# Patient Record
Sex: Female | Born: 1940 | Race: White | Hispanic: No | Smoking: Never smoker
Health system: Southern US, Community
[De-identification: ages and names within clinical notes are randomized; demographics above are authoritative.]

## PROBLEM LIST (undated history)

## (undated) DIAGNOSIS — R0981 Nasal congestion: Secondary | ICD-10-CM

## (undated) DIAGNOSIS — E7800 Pure hypercholesterolemia, unspecified: Secondary | ICD-10-CM

## (undated) DIAGNOSIS — G2581 Restless legs syndrome: Secondary | ICD-10-CM

## (undated) DIAGNOSIS — M1990 Unspecified osteoarthritis, unspecified site: Secondary | ICD-10-CM

## (undated) DIAGNOSIS — M47812 Spondylosis without myelopathy or radiculopathy, cervical region: Secondary | ICD-10-CM

## (undated) DIAGNOSIS — I10 Essential (primary) hypertension: Secondary | ICD-10-CM

## (undated) DIAGNOSIS — K8020 Calculus of gallbladder without cholecystitis without obstruction: Secondary | ICD-10-CM

## (undated) DIAGNOSIS — H9190 Unspecified hearing loss, unspecified ear: Secondary | ICD-10-CM

## (undated) DIAGNOSIS — J45909 Unspecified asthma, uncomplicated: Secondary | ICD-10-CM

## (undated) DIAGNOSIS — E785 Hyperlipidemia, unspecified: Secondary | ICD-10-CM

## (undated) DIAGNOSIS — R140 Abdominal distension (gaseous): Secondary | ICD-10-CM

## (undated) DIAGNOSIS — N183 Chronic kidney disease, stage 3 unspecified: Secondary | ICD-10-CM

## (undated) DIAGNOSIS — C801 Malignant (primary) neoplasm, unspecified: Secondary | ICD-10-CM

## (undated) DIAGNOSIS — L309 Dermatitis, unspecified: Secondary | ICD-10-CM

## (undated) DIAGNOSIS — K635 Polyp of colon: Secondary | ICD-10-CM

## (undated) DIAGNOSIS — M722 Plantar fascial fibromatosis: Secondary | ICD-10-CM

## (undated) DIAGNOSIS — G4733 Obstructive sleep apnea (adult) (pediatric): Secondary | ICD-10-CM

## (undated) DIAGNOSIS — M8580 Other specified disorders of bone density and structure, unspecified site: Secondary | ICD-10-CM

## (undated) DIAGNOSIS — K219 Gastro-esophageal reflux disease without esophagitis: Secondary | ICD-10-CM

## (undated) DIAGNOSIS — R062 Wheezing: Secondary | ICD-10-CM

## (undated) DIAGNOSIS — N6091 Unspecified benign mammary dysplasia of right breast: Secondary | ICD-10-CM

## (undated) HISTORY — DX: Unspecified osteoarthritis, unspecified site: M19.90

## (undated) HISTORY — DX: Unspecified hearing loss, unspecified ear: H91.90

## (undated) HISTORY — DX: Chronic kidney disease, stage 3 (moderate): N18.3

## (undated) HISTORY — DX: Other specified disorders of bone density and structure, unspecified site: M85.80

## (undated) HISTORY — DX: Abdominal distension (gaseous): R14.0

## (undated) HISTORY — DX: Polyp of colon: K63.5

## (undated) HISTORY — DX: Unspecified asthma, uncomplicated: J45.909

## (undated) HISTORY — DX: Nasal congestion: R09.81

## (undated) HISTORY — DX: Hyperlipidemia, unspecified: E78.5

## (undated) HISTORY — DX: Spondylosis without myelopathy or radiculopathy, cervical region: M47.812

## (undated) HISTORY — DX: Restless legs syndrome: G25.81

## (undated) HISTORY — DX: Malignant (primary) neoplasm, unspecified: C80.1

## (undated) HISTORY — DX: Plantar fascial fibromatosis: M72.2

## (undated) HISTORY — DX: Calculus of gallbladder without cholecystitis without obstruction: K80.20

## (undated) HISTORY — PX: FOOT NEUROMA SURGERY: SHX646

## (undated) HISTORY — DX: Pure hypercholesterolemia, unspecified: E78.00

## (undated) HISTORY — DX: Gastro-esophageal reflux disease without esophagitis: K21.9

## (undated) HISTORY — DX: Essential (primary) hypertension: I10

## (undated) HISTORY — DX: Chronic kidney disease, stage 3 unspecified: N18.30

## (undated) HISTORY — PX: LIPOMA EXCISION: SHX5283

## (undated) HISTORY — DX: Dermatitis, unspecified: L30.9

## (undated) HISTORY — DX: Wheezing: R06.2

## (undated) HISTORY — DX: Obstructive sleep apnea (adult) (pediatric): G47.33

---

## 1969-05-23 HISTORY — PX: TUBAL LIGATION: SHX77

## 1975-05-24 HISTORY — PX: BREAST SURGERY: SHX581

## 1994-05-23 HISTORY — PX: BREAST SURGERY: SHX581

## 1998-10-06 ENCOUNTER — Other Ambulatory Visit: Admission: RE | Admit: 1998-10-06 | Discharge: 1998-10-06 | Payer: Self-pay | Admitting: Obstetrics and Gynecology

## 1998-11-02 ENCOUNTER — Other Ambulatory Visit: Admission: RE | Admit: 1998-11-02 | Discharge: 1998-11-02 | Payer: Self-pay | Admitting: Obstetrics and Gynecology

## 1998-12-12 ENCOUNTER — Ambulatory Visit (HOSPITAL_COMMUNITY): Admission: RE | Admit: 1998-12-12 | Discharge: 1998-12-12 | Payer: Self-pay | Admitting: *Deleted

## 1999-04-29 ENCOUNTER — Other Ambulatory Visit: Admission: RE | Admit: 1999-04-29 | Discharge: 1999-04-29 | Payer: Self-pay | Admitting: Obstetrics and Gynecology

## 2000-04-26 ENCOUNTER — Other Ambulatory Visit: Admission: RE | Admit: 2000-04-26 | Discharge: 2000-04-26 | Payer: Self-pay | Admitting: Obstetrics and Gynecology

## 2001-09-11 ENCOUNTER — Other Ambulatory Visit: Admission: RE | Admit: 2001-09-11 | Discharge: 2001-09-11 | Payer: Self-pay | Admitting: Family Medicine

## 2002-10-29 ENCOUNTER — Ambulatory Visit (HOSPITAL_COMMUNITY): Admission: RE | Admit: 2002-10-29 | Discharge: 2002-10-29 | Payer: Self-pay | Admitting: General Surgery

## 2003-03-12 ENCOUNTER — Other Ambulatory Visit: Admission: RE | Admit: 2003-03-12 | Discharge: 2003-03-12 | Payer: Self-pay | Admitting: Family Medicine

## 2003-03-14 ENCOUNTER — Encounter: Payer: Self-pay | Admitting: Family Medicine

## 2003-03-14 ENCOUNTER — Ambulatory Visit (HOSPITAL_COMMUNITY): Admission: RE | Admit: 2003-03-14 | Discharge: 2003-03-14 | Payer: Self-pay | Admitting: Family Medicine

## 2004-03-12 ENCOUNTER — Other Ambulatory Visit: Admission: RE | Admit: 2004-03-12 | Discharge: 2004-03-12 | Payer: Self-pay | Admitting: Family Medicine

## 2004-05-13 ENCOUNTER — Ambulatory Visit (HOSPITAL_COMMUNITY): Admission: RE | Admit: 2004-05-13 | Discharge: 2004-05-13 | Payer: Self-pay | Admitting: Gastroenterology

## 2006-06-01 ENCOUNTER — Other Ambulatory Visit: Admission: RE | Admit: 2006-06-01 | Discharge: 2006-06-01 | Payer: Self-pay | Admitting: Family Medicine

## 2011-04-23 DIAGNOSIS — C801 Malignant (primary) neoplasm, unspecified: Secondary | ICD-10-CM

## 2011-04-23 HISTORY — DX: Malignant (primary) neoplasm, unspecified: C80.1

## 2011-05-06 ENCOUNTER — Other Ambulatory Visit: Payer: Self-pay | Admitting: Cardiology

## 2011-05-06 ENCOUNTER — Ambulatory Visit
Admission: RE | Admit: 2011-05-06 | Discharge: 2011-05-06 | Disposition: A | Discharge status: Home / Self Care | Payer: Medicare Other | Source: Ambulatory Visit | Attending: Cardiology | Admitting: Cardiology

## 2011-05-06 DIAGNOSIS — R0602 Shortness of breath: Secondary | ICD-10-CM

## 2011-05-06 MED ORDER — IOHEXOL 300 MG/ML  SOLN: 100.0000 mL | Freq: Once | INTRAMUSCULAR | Status: AC | PRN

## 2011-05-31 DIAGNOSIS — G2589 Other specified extrapyramidal and movement disorders: Secondary | ICD-10-CM | POA: Diagnosis not present

## 2011-05-31 DIAGNOSIS — J309 Allergic rhinitis, unspecified: Secondary | ICD-10-CM | POA: Diagnosis not present

## 2011-05-31 DIAGNOSIS — I10 Essential (primary) hypertension: Secondary | ICD-10-CM | POA: Diagnosis not present

## 2011-05-31 DIAGNOSIS — E785 Hyperlipidemia, unspecified: Secondary | ICD-10-CM | POA: Diagnosis not present

## 2011-05-31 DIAGNOSIS — K219 Gastro-esophageal reflux disease without esophagitis: Secondary | ICD-10-CM | POA: Diagnosis not present

## 2011-05-31 DIAGNOSIS — N289 Disorder of kidney and ureter, unspecified: Secondary | ICD-10-CM | POA: Diagnosis not present

## 2011-05-31 DIAGNOSIS — J45909 Unspecified asthma, uncomplicated: Secondary | ICD-10-CM | POA: Diagnosis not present

## 2011-06-03 DIAGNOSIS — H1045 Other chronic allergic conjunctivitis: Secondary | ICD-10-CM | POA: Diagnosis not present

## 2011-06-03 DIAGNOSIS — J45909 Unspecified asthma, uncomplicated: Secondary | ICD-10-CM | POA: Diagnosis not present

## 2011-06-03 DIAGNOSIS — J3089 Other allergic rhinitis: Secondary | ICD-10-CM | POA: Diagnosis not present

## 2011-06-07 DIAGNOSIS — Z01810 Encounter for preprocedural cardiovascular examination: Secondary | ICD-10-CM | POA: Diagnosis not present

## 2011-06-07 DIAGNOSIS — N289 Disorder of kidney and ureter, unspecified: Secondary | ICD-10-CM | POA: Diagnosis not present

## 2011-06-07 DIAGNOSIS — I498 Other specified cardiac arrhythmias: Secondary | ICD-10-CM | POA: Diagnosis not present

## 2011-06-07 DIAGNOSIS — J45909 Unspecified asthma, uncomplicated: Secondary | ICD-10-CM | POA: Diagnosis not present

## 2011-06-07 DIAGNOSIS — C649 Malignant neoplasm of unspecified kidney, except renal pelvis: Secondary | ICD-10-CM | POA: Diagnosis not present

## 2011-06-07 DIAGNOSIS — E7800 Pure hypercholesterolemia, unspecified: Secondary | ICD-10-CM | POA: Diagnosis not present

## 2011-06-07 DIAGNOSIS — I10 Essential (primary) hypertension: Secondary | ICD-10-CM | POA: Diagnosis not present

## 2011-06-07 DIAGNOSIS — Z01818 Encounter for other preprocedural examination: Secondary | ICD-10-CM | POA: Diagnosis not present

## 2011-06-09 DIAGNOSIS — Z452 Encounter for adjustment and management of vascular access device: Secondary | ICD-10-CM | POA: Diagnosis not present

## 2011-06-09 DIAGNOSIS — J9819 Other pulmonary collapse: Secondary | ICD-10-CM | POA: Diagnosis not present

## 2011-06-09 DIAGNOSIS — I82220 Acute embolism and thrombosis of inferior vena cava: Secondary | ICD-10-CM | POA: Diagnosis not present

## 2011-06-09 DIAGNOSIS — D4959 Neoplasm of unspecified behavior of other genitourinary organ: Secondary | ICD-10-CM | POA: Diagnosis not present

## 2011-06-09 DIAGNOSIS — N289 Disorder of kidney and ureter, unspecified: Secondary | ICD-10-CM | POA: Diagnosis not present

## 2011-06-09 DIAGNOSIS — J90 Pleural effusion, not elsewhere classified: Secondary | ICD-10-CM | POA: Diagnosis not present

## 2011-06-09 DIAGNOSIS — J9383 Other pneumothorax: Secondary | ICD-10-CM | POA: Diagnosis not present

## 2011-06-09 DIAGNOSIS — R918 Other nonspecific abnormal finding of lung field: Secondary | ICD-10-CM | POA: Diagnosis not present

## 2011-06-09 DIAGNOSIS — C649 Malignant neoplasm of unspecified kidney, except renal pelvis: Secondary | ICD-10-CM | POA: Diagnosis not present

## 2011-06-09 HISTORY — PX: NEPHRECTOMY: SHX65

## 2011-06-21 DIAGNOSIS — Z09 Encounter for follow-up examination after completed treatment for conditions other than malignant neoplasm: Secondary | ICD-10-CM | POA: Diagnosis not present

## 2011-06-21 DIAGNOSIS — C649 Malignant neoplasm of unspecified kidney, except renal pelvis: Secondary | ICD-10-CM | POA: Diagnosis not present

## 2011-08-02 DIAGNOSIS — L570 Actinic keratosis: Secondary | ICD-10-CM | POA: Diagnosis not present

## 2011-08-02 DIAGNOSIS — L821 Other seborrheic keratosis: Secondary | ICD-10-CM | POA: Diagnosis not present

## 2011-09-16 DIAGNOSIS — R209 Unspecified disturbances of skin sensation: Secondary | ICD-10-CM | POA: Diagnosis not present

## 2011-09-16 DIAGNOSIS — Z09 Encounter for follow-up examination after completed treatment for conditions other than malignant neoplasm: Secondary | ICD-10-CM | POA: Diagnosis not present

## 2011-09-16 DIAGNOSIS — J8410 Pulmonary fibrosis, unspecified: Secondary | ICD-10-CM | POA: Diagnosis not present

## 2011-09-16 DIAGNOSIS — K8020 Calculus of gallbladder without cholecystitis without obstruction: Secondary | ICD-10-CM | POA: Diagnosis not present

## 2011-09-16 DIAGNOSIS — Z85528 Personal history of other malignant neoplasm of kidney: Secondary | ICD-10-CM | POA: Diagnosis not present

## 2011-09-16 DIAGNOSIS — C649 Malignant neoplasm of unspecified kidney, except renal pelvis: Secondary | ICD-10-CM | POA: Diagnosis not present

## 2011-11-03 DIAGNOSIS — I10 Essential (primary) hypertension: Secondary | ICD-10-CM | POA: Diagnosis not present

## 2011-11-03 DIAGNOSIS — G4733 Obstructive sleep apnea (adult) (pediatric): Secondary | ICD-10-CM | POA: Diagnosis not present

## 2011-11-21 DIAGNOSIS — Z8262 Family history of osteoporosis: Secondary | ICD-10-CM | POA: Diagnosis not present

## 2011-11-21 DIAGNOSIS — Z803 Family history of malignant neoplasm of breast: Secondary | ICD-10-CM | POA: Diagnosis not present

## 2011-11-21 DIAGNOSIS — M899 Disorder of bone, unspecified: Secondary | ICD-10-CM | POA: Diagnosis not present

## 2011-11-21 DIAGNOSIS — Z1231 Encounter for screening mammogram for malignant neoplasm of breast: Secondary | ICD-10-CM | POA: Diagnosis not present

## 2011-11-21 DIAGNOSIS — M949 Disorder of cartilage, unspecified: Secondary | ICD-10-CM | POA: Diagnosis not present

## 2011-11-28 DIAGNOSIS — F4321 Adjustment disorder with depressed mood: Secondary | ICD-10-CM | POA: Diagnosis not present

## 2011-11-28 DIAGNOSIS — I10 Essential (primary) hypertension: Secondary | ICD-10-CM | POA: Diagnosis not present

## 2011-11-28 DIAGNOSIS — M899 Disorder of bone, unspecified: Secondary | ICD-10-CM | POA: Diagnosis not present

## 2011-11-28 DIAGNOSIS — K219 Gastro-esophageal reflux disease without esophagitis: Secondary | ICD-10-CM | POA: Diagnosis not present

## 2011-11-28 DIAGNOSIS — E785 Hyperlipidemia, unspecified: Secondary | ICD-10-CM | POA: Diagnosis not present

## 2011-12-05 DIAGNOSIS — E875 Hyperkalemia: Secondary | ICD-10-CM | POA: Diagnosis not present

## 2011-12-09 DIAGNOSIS — J45909 Unspecified asthma, uncomplicated: Secondary | ICD-10-CM | POA: Diagnosis not present

## 2011-12-09 DIAGNOSIS — J3089 Other allergic rhinitis: Secondary | ICD-10-CM | POA: Diagnosis not present

## 2011-12-09 DIAGNOSIS — H1045 Other chronic allergic conjunctivitis: Secondary | ICD-10-CM | POA: Diagnosis not present

## 2012-01-20 DIAGNOSIS — Z905 Acquired absence of kidney: Secondary | ICD-10-CM | POA: Diagnosis not present

## 2012-01-20 DIAGNOSIS — Z85528 Personal history of other malignant neoplasm of kidney: Secondary | ICD-10-CM | POA: Diagnosis not present

## 2012-01-20 DIAGNOSIS — R911 Solitary pulmonary nodule: Secondary | ICD-10-CM | POA: Diagnosis not present

## 2012-01-20 DIAGNOSIS — J9819 Other pulmonary collapse: Secondary | ICD-10-CM | POA: Diagnosis not present

## 2012-01-20 DIAGNOSIS — Z4889 Encounter for other specified surgical aftercare: Secondary | ICD-10-CM | POA: Diagnosis not present

## 2012-01-20 DIAGNOSIS — IMO0002 Reserved for concepts with insufficient information to code with codable children: Secondary | ICD-10-CM | POA: Diagnosis not present

## 2012-01-20 DIAGNOSIS — C649 Malignant neoplasm of unspecified kidney, except renal pelvis: Secondary | ICD-10-CM | POA: Diagnosis not present

## 2012-01-20 DIAGNOSIS — K8020 Calculus of gallbladder without cholecystitis without obstruction: Secondary | ICD-10-CM | POA: Diagnosis not present

## 2012-01-24 DIAGNOSIS — H4320 Crystalline deposits in vitreous body, unspecified eye: Secondary | ICD-10-CM | POA: Diagnosis not present

## 2012-01-24 DIAGNOSIS — H2510 Age-related nuclear cataract, unspecified eye: Secondary | ICD-10-CM | POA: Diagnosis not present

## 2012-01-24 DIAGNOSIS — D3130 Benign neoplasm of unspecified choroid: Secondary | ICD-10-CM | POA: Diagnosis not present

## 2012-01-30 ENCOUNTER — Other Ambulatory Visit (HOSPITAL_COMMUNITY): Payer: Self-pay | Admitting: Family Medicine

## 2012-01-30 DIAGNOSIS — K8020 Calculus of gallbladder without cholecystitis without obstruction: Secondary | ICD-10-CM

## 2012-01-30 DIAGNOSIS — R1011 Right upper quadrant pain: Secondary | ICD-10-CM | POA: Diagnosis not present

## 2012-01-31 ENCOUNTER — Other Ambulatory Visit (HOSPITAL_COMMUNITY): Payer: Medicare Other

## 2012-01-31 DIAGNOSIS — R1011 Right upper quadrant pain: Secondary | ICD-10-CM | POA: Diagnosis not present

## 2012-02-02 ENCOUNTER — Encounter (INDEPENDENT_AMBULATORY_CARE_PROVIDER_SITE_OTHER): Payer: Self-pay | Admitting: General Surgery

## 2012-02-10 ENCOUNTER — Encounter (INDEPENDENT_AMBULATORY_CARE_PROVIDER_SITE_OTHER): Payer: Self-pay | Admitting: General Surgery

## 2012-02-14 DIAGNOSIS — Z23 Encounter for immunization: Secondary | ICD-10-CM | POA: Diagnosis not present

## 2012-02-15 ENCOUNTER — Ambulatory Visit (INDEPENDENT_AMBULATORY_CARE_PROVIDER_SITE_OTHER): Payer: Medicare Other | Admitting: General Surgery

## 2012-02-15 ENCOUNTER — Encounter (INDEPENDENT_AMBULATORY_CARE_PROVIDER_SITE_OTHER): Payer: Self-pay | Admitting: General Surgery

## 2012-02-15 VITALS — BP 128/78 | HR 72 | Temp 97.6°F | Resp 16 | Ht 62.5 in | Wt 166.2 lb

## 2012-02-15 DIAGNOSIS — K8020 Calculus of gallbladder without cholecystitis without obstruction: Secondary | ICD-10-CM

## 2012-02-15 NOTE — Progress Notes (Addendum)
Patient ID: Mackenzie King, female   DOB: 01-Apr-1941, 71 y.o.   MRN: [id number]  Chief Complaint  Patient presents with  . Abdominal Pain    HPI Mackenzie King is a 71 y.o. female.  She is referred by Dr. Merri Brunette for evaluation of symptomatic gallstones. Her regular primary care physician is Dr. Laurann Montana, and her cardiologist and physician who  manages her sleep apnea is Dr. Carolanne Grumbling.  This patient underwent open radical right nephrectomy with resection of tumor and clot in the renal vein and IVC at [facility] in January 2013 by Dr. Nicanor Alcon of urology department and Dr. Chauncey Mann of Surgical Oncology.  She did well with that surgery. No known metastasis.  She says she's had some chronic right upper quadrant pain, sort of always in the background. 2 weeks ago she woke up at 4 AM with moderately severe right upper quadrant pain, nausea, 2 episodes of vomiting, and dizziness. This resolved after a few hours. Since that time she's been following a very low-fat diet has not had any more severe episodes of pain but still has a chronic right upper quadrant discomfort. She might have had  a little fever with that episode but otherwise is able to eat. No history of liver disease or cardiovascular disease. She does have asthma and  sleep apnea.  She states she has been known to have gallstones for many years seen on prior imaging studies but this recent episode of pain may have been the first episode of biliary colic. She is just not sure.  Ultrasound performed on 01/31/2012 shows multiple gallstones. No evidence of inflammation. Normal common bile duct. This right kidney surgically absent.  Her co-morbidities included asthma, hyperlipidemia, hypertension, sleep apnea using CPAP at night, GERD, chronic kidney disease. Prior surgical history includes a radical right nephrectomy and bilateral tubal ligation. HPI  Past Medical History  Diagnosis Date  . Asthma   . GERD (gastroesophageal  reflux disease)   . Hypertension   . Dermatitis     intermittent  . Plantar fasciitis   . Asymptomatic gallstones   . Colon polyps     hyperplastic  . Restless legs syndrome (RLS)   . Osteopenia     mild  . Obstructive sleep apnea   . Facet arthropathy, cervical   . Osteopenia   . CKD (chronic kidney disease), stage III   . Cancer     renal cell  . Hypercholesterolemia   . Pure hypercholesterolemia   . Dyslipidemia   . Osteoarthritis   . Colon polyps     hyperplastic  . CKD (chronic kidney disease), stage III   . Hearing loss   . Nasal congestion   . Wheezing   . Abdominal distention     Past Surgical History  Procedure Date  . Lipoma excision   . 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 Tubal ligation 1971  . Breast surgery 1996    breast biopsy - benign calcifications  . Breast surgery 1977    adenofibroma removed - 3 biopsy's    Family History  Problem Relation Age of Onset  . Transient ischemic attack Mother   . Cancer Mother     breast  . Hypertension Mother   . Alzheimer's disease Mother   . Stroke Father   . Colon polyps Father   . Cancer Sister   . Cancer Paternal Grandfather  lung    Social History History  Substance Use Topics  . Smoking status: Never Smoker   . Smokeless tobacco: Never Used  . Alcohol Use: Yes     rare    Allergies  Allergen Reactions  . Crestor (Rosuvastatin) Other (See Comments)    Muscle aches, constipation.  . Sulfa Antibiotics Itching and Swelling    Face only, breathing not affected.    Current Outpatient Prescriptions  Medication Sig Dispense Refill  . BESIVANCE 0.6 % SUSP       . levocetirizine (XYZAL) 5 MG tablet Daily.      Marland Kitchen PATANASE 0.6 % SOLN as needed.      . prednisoLONE acetate (PRED FORTE) 1 % ophthalmic suspension as needed.      Marland Kitchen albuterol (PROVENTIL HFA;VENTOLIN HFA) 108 (90 BASE) MCG/ACT inhaler Inhale 2 puffs into the lungs as needed.      .  cetirizine (ZYRTEC) 10 MG tablet Take 10 mg by mouth daily.      . clonazePAM (KLONOPIN) 1 MG tablet Take 1 mg by mouth at bedtime. Take 1/2 or 1 tablet at bedtime.      . Coenzyme Q10 (CO Q-10) 100 MG CAPS Take by mouth daily.      . Ergocalciferol (VITAMIN D2) 2000 UNITS TABS Take by mouth once a week.      . fluticasone (FLONASE) 50 MCG/ACT nasal spray Place 2 sprays into the nose as needed.      . Fluticasone-Salmeterol (ADVAIR DISKUS) 250-50 MCG/DOSE AEPB Inhale 1 puff into the lungs every 12 (twelve) hours.      Marland Kitchen HYDROcodone-acetaminophen (VICODIN) 5-500 MG per tablet Take 1 tablet by mouth every 6 (six) hours as needed.      . hydrOXYzine (ATARAX/VISTARIL) 25 MG tablet Take 25 mg by mouth at bedtime as needed. Take 2 tablets once per day at bedtime as needed.      Marland Kitchen lisinopril (PRINIVIL,ZESTRIL) 20 MG tablet Take 20 mg by mouth daily.      . Multiple Vitamin (MULTIVITAMIN) tablet Take 1 tablet by mouth daily.      Marland Kitchen omeprazole (PRILOSEC) 20 MG capsule Take 20 mg by mouth daily.      . pravastatin (PRAVACHOL) 40 MG tablet Take 40 mg by mouth every evening.      . Triamcinolone Acetonide (TRIAMCINOLONE 0.1 % CREAM : EUCERIN) CREA Apply topically as needed.        Review of Systems Review of Systems  Constitutional: Negative for fever, chills and unexpected weight change.  HENT: Negative for hearing loss, congestion, sore throat, trouble swallowing and voice change.   Eyes: Negative for visual disturbance.  Respiratory: Negative for cough and wheezing.   Cardiovascular: Positive for chest pain. Negative for palpitations and leg swelling.  Gastrointestinal: Positive for nausea, vomiting and abdominal pain. Negative for diarrhea, constipation, blood in stool, abdominal distention and anal bleeding.  Genitourinary: Negative for hematuria, vaginal bleeding and difficulty urinating.  Musculoskeletal: Negative for arthralgias.  Skin: Negative for rash and wound.  Neurological: Negative for  seizures, syncope and headaches.  Hematological: Negative for adenopathy. Does not bruise/bleed easily.  Psychiatric/Behavioral: Negative for confusion.    Blood pressure 128/78, pulse 72, temperature 97.6 F (36.4 C), temperature source Temporal, resp. rate 16, height 5' 2.5" (1.588 m), weight 166 lb 4 oz (75.411 kg).  Physical Exam Physical Exam  Constitutional: She is oriented to person, place, and time. She appears well-developed and well-nourished. No distress.  HENT:  Head: Normocephalic and atraumatic.  Nose: Nose normal.  Mouth/Throat: No oropharyngeal exudate.  Eyes: Conjunctivae normal and EOM are normal. Pupils are equal, round, and reactive to light. Left eye exhibits no discharge. No scleral icterus.  Neck: Neck supple. No JVD present. No tracheal deviation present. No thyromegaly present.  Cardiovascular: Normal rate, regular rhythm, normal heart sounds and intact distal pulses.   No murmur heard. Pulmonary/Chest: Effort normal and breath sounds normal. No respiratory distress. She has no wheezes. She has no rales. She exhibits no tenderness.  Abdominal: Soft. Bowel sounds are normal. She exhibits no distension and no mass. There is no tenderness. There is no rebound and no guarding.         Upper midline incision which is T'd off to the right well above the umbilicus. Short incision in the suprapubic area from tubal ligation. No mass. No hernias. Not particularly tender. Not distended.  Musculoskeletal: She exhibits no edema and no tenderness.  Lymphadenopathy:    She has no cervical adenopathy.  Neurological: She is alert and oriented to person, place, and time. She exhibits normal muscle tone. Coordination normal.  Skin: Skin is warm. No rash noted. She is not diaphoretic. No erythema. No pallor.  Psychiatric: She has a normal mood and affect. Her behavior is normal. Judgment and thought content normal.    Data Reviewed I have reviewed the ultrasound report, recent  lab work, and Dr. Michaelle Copas office today. I have requested the operative note and discharge summary from Dr. Nicanor Alcon at [facility] Department of urology.  Assessment    Chronic cholecystitis with cholelithiasis. She is now having biliary colic. Elective cholecystectomy is appropriate and is offered to the patient. She very much wants to go ahead with this in the near future.  Status post open right radical nephrectomy. Operative details pending. She will likely have significant adhesions which will complicate cholecystectomy.   Asthma  Sleep apnea  Hypertension  Chronic kidney disease  History tubal ligation  Hyperlipidemia    Plan    Obtain operative note and discharge summary from [facility] Department of Urology so I can understand the details of her right nephrectomy  Schedule for laparoscopic cholecystectomy with cholangiogram possible open. She has requested [facility].  I discussed the indications, details, techniques, and numerous risks of the surgery with her. She knows that she is at increased risk of converting to open because of severe adhesions. She understands all these issues. Her questions were answered. She agrees with this plan.       Angelia Mould. Derrell Lolling, M.D., [facility] General and Minimally invasive Surgery Breast and Colorectal Surgery Office:   [phone] Pager:   [phone]  02/15/2012, 2:03 PM

## 2012-02-15 NOTE — Patient Instructions (Signed)
Your right upper quadrant pain is almost certainly due to your gallstones.  You will be scheduled for a laparoscopic cholecystectomy with cholangiogram, possible open surgery.     Laparoscopic Cholecystectomy Laparoscopic cholecystectomy is surgery to remove the gallbladder. The gallbladder is located slightly to the right of center in the abdomen, behind the liver. It is a concentrating and storage sac for the bile produced in the liver. Bile aids in the digestion and absorption of fats. Gallbladder disease (cholecystitis) is an inflammation of your gallbladder. This condition is usually caused by a buildup of gallstones (cholelithiasis) in your gallbladder. Gallstones can block the flow of bile, resulting in inflammation and pain. In severe cases, emergency surgery may be required. When emergency surgery is not required, you will have time to prepare for the procedure. Laparoscopic surgery is an alternative to open surgery. Laparoscopic surgery usually has a shorter recovery time. Your common bile duct may also need to be examined and explored. Your caregiver will discuss this with you if he or she feels this should be done. If stones are found in the common bile duct, they may be removed. LET YOUR CAREGIVER KNOW ABOUT:  Allergies to food or medicine.   Medicines taken, including vitamins, herbs, eyedrops, over-the-counter medicines, and creams.   Use of steroids (by mouth or creams).   Previous problems with anesthetics or numbing medicines.   History of bleeding problems or blood clots.   Previous surgery.   Other health problems, including diabetes and kidney problems.   Possibility of pregnancy, if this applies.  RISKS AND COMPLICATIONS All surgery is associated with risks. Some problems that may occur following this procedure include:  Infection.   Damage to the common bile duct, nerves, arteries, veins, or other internal organs such as the stomach or intestines.   Bleeding.     A stone may remain in the common bile duct.  BEFORE THE PROCEDURE  Do not take aspirin for 3 days prior to surgery or blood thinners for 1 week prior to surgery.   Do not eat or drink anything after midnight the night before surgery.   Let your caregiver know if you develop a cold or other infectious problem prior to surgery.   You should be present 60 minutes before the procedure or as directed.  PROCEDURE  You will be given medicine that makes you sleep (general anesthetic). When you are asleep, your surgeon will make several small cuts (incisions) in your abdomen. One of these incisions is used to insert a small, lighted scope (laparoscope) into the abdomen. The laparoscope helps the surgeon see into your abdomen. Carbon dioxide gas will be pumped into your abdomen. The gas allows more room for the surgeon to perform your surgery. Other operating instruments are inserted through the other incisions. Laparoscopic procedures may not be appropriate when:  There is major scarring from previous surgery.   The gallbladder is extremely inflamed.   There are bleeding disorders or unexpected cirrhosis of the liver.   A pregnancy is near term.   Other conditions make the laparoscopic procedure impossible.  If your surgeon feels it is not safe to continue with a laparoscopic procedure, he or she will perform an open abdominal procedure. In this case, the surgeon will make an incision to open the abdomen. This gives the surgeon a larger view and field to work within. This may allow the surgeon to perform procedures that sometimes cannot be performed with a laparoscope alone. Open surgery has a longer recovery  time. AFTER THE PROCEDURE  You will be taken to the recovery area where a nurse will watch and check your progress.   You may be allowed to go home the same day.   Do not resume physical activities until directed by your caregiver.   You may resume a normal diet and activities as  directed.  Document Released: 05/09/2005 Document Revised: 04/28/2011 Document Reviewed: 10/22/2010 [facility] Patient Information 2012 [place], [place].

## 2012-02-16 DIAGNOSIS — H2510 Age-related nuclear cataract, unspecified eye: Secondary | ICD-10-CM | POA: Diagnosis not present

## 2012-02-20 DIAGNOSIS — H2510 Age-related nuclear cataract, unspecified eye: Secondary | ICD-10-CM | POA: Diagnosis not present

## 2012-02-21 HISTORY — PX: EYE SURGERY: SHX253

## 2012-03-01 ENCOUNTER — Encounter (INDEPENDENT_AMBULATORY_CARE_PROVIDER_SITE_OTHER): Payer: Self-pay

## 2012-03-05 DIAGNOSIS — H2510 Age-related nuclear cataract, unspecified eye: Secondary | ICD-10-CM | POA: Diagnosis not present

## 2012-03-08 ENCOUNTER — Encounter (HOSPITAL_COMMUNITY): Payer: Self-pay | Admitting: Pharmacy Technician

## 2012-03-14 ENCOUNTER — Encounter (HOSPITAL_COMMUNITY): Payer: Self-pay

## 2012-03-14 ENCOUNTER — Encounter (HOSPITAL_COMMUNITY)
Admission: RE | Admit: 2012-03-14 | Discharge: 2012-03-14 | Disposition: A | Discharge status: Home / Self Care | Payer: Medicare Other | Source: Ambulatory Visit | Attending: General Surgery | Admitting: General Surgery

## 2012-03-14 LAB — COMPREHENSIVE METABOLIC PANEL
ALT: 12 U/L (ref 0–35)
Calcium: 9.7 mg/dL (ref 8.4–10.5)
Creatinine, Ser: 1.08 mg/dL (ref 0.50–1.10)
GFR calc Af Amer: 58 mL/min — ABNORMAL LOW (ref >90)
Glucose, Bld: 97 mg/dL (ref 70–99)
Sodium: 136 mEq/L (ref 135–145)
Total Protein: 6.9 g/dL (ref 6.0–8.3)

## 2012-03-14 LAB — CBC WITH DIFFERENTIAL/PLATELET
Basophils Absolute: 0 10*3/uL (ref 0.0–0.1)
Eosinophils Absolute: 0.3 10*3/uL (ref 0.0–0.7)
Eosinophils Relative: 5 % (ref 0–5)
Lymphs Abs: 2.5 10*3/uL (ref 0.7–4.0)
MCH: 28.7 pg (ref 26.0–34.0)
MCV: 86.6 fL (ref 78.0–100.0)
Platelets: 336 10*3/uL (ref 150–400)
RDW: 12.8 % (ref 11.5–15.5)

## 2012-03-14 LAB — URINALYSIS, ROUTINE W REFLEX MICROSCOPIC
Bilirubin Urine: NEGATIVE
Leukocytes, UA: NEGATIVE
Nitrite: NEGATIVE
Specific Gravity, Urine: 1.008 (ref 1.005–1.030)
pH: 6 (ref 5.0–8.0)

## 2012-03-14 NOTE — Patient Instructions (Addendum)
20      Your procedure is scheduled on:  Monday 03/19/2012 at 1130 am  Report to [facility] at 0900 AM.  Call this number if you have problems the morning of surgery: [phone]   Remember:   Do not eat food or drink liquids after midnight!  Take these medicines the morning of surgery with A SIP OF WATER:Prilosec, use Advair diskus ,use Albuterol inhaler as needed and Flonase nasal spray as needed-Please bring inhalers with you to hospital !Use eye drops    Do not bring valuables to the hospital.  .  Leave suitcase in the car. After surgery it may be brought to your room.  For patients admitted to the hospital, checkout time is 11:00 AM the day of              Discharge.    Special Instructions: See [facility] Preparing  For Surgery Instruction Sheet. Do not wear jewelry, lotions powders, perfumes. Women do not shave legs or underarms for 12 hours before showers. Contacts, partial plates, or dentures may not be worn into surgery.                          Patients discharged the day of surgery will not be allowed to drive home.  If going home the same day of surgery, must have someone stay with you first 24 hrs.at home and arrange for someone to drive you home from the  Hospital.               Please read over the following fact sheets that you were given: MRSA              INFORMATION, Sleep apnea sheet, Incentive Spirometry sheet               Telford Nab.Panzy Bubeck,RN,BSN [phone]

## 2012-03-14 NOTE — Progress Notes (Signed)
Echocardiogram from [facility] 05/06/2011 on chart.

## 2012-03-14 NOTE — Progress Notes (Signed)
Chest X-ray from [facility] 06/13/2011, CT abdomen ,pelvis w/con. Ct chest w/con 01/20/2012 from [facility] ,EKG from [facility] 06/07/2011, Surgical Pathology from [facility] 06/09/2011 all on chart. Last office visit from [facility] Cardiology from 11/03/2011 on chart.

## 2012-03-15 NOTE — H&P (Signed)
Mackenzie King     MRN: [id number]   Description: 71 year old female  Provider: Ernestene Mention, MD  Department: [facility]        Diagnoses     Gallstones   - Primary    574.20         Vitals    BP Pulse Temp Resp Ht Wt    128/78 72 97.6 F (36.4 C) (Temporal) 16 5' 2.5" (1.588 m) 166 lb 4 oz (75.411 kg)   BMI 29.92 kg/m2                 History and Physical    Ernestene Mention, MD   Patient ID: Mackenzie King, female   DOB: [birth year], 71 y.o.   MRN: [id number]               HPI Mackenzie King is a 71 y.o. female.  She is referred by Dr. Merri Brunette for evaluation of symptomatic gallstones. Her regular primary care physician is Dr. Laurann Montana, and her cardiologist and physician who  manages her sleep apnea is Dr. Carolanne Grumbling.   This patient underwent open radical right nephrectomy with resection of tumor and clot in the renal vein and IVC at [facility] in January 2013 by Dr. Nicanor Alcon of urology department and Dr. Chauncey Mann of Surgical Oncology.  She did well with that surgery. No known metastasis.   She says she's had some chronic right upper quadrant pain, sort of always in the background. 2 weeks ago she woke up at 4 AM with moderately severe right upper quadrant pain, nausea, 2 episodes of vomiting, and dizziness. This resolved after a few hours. Since that time she's been following a very low-fat diet has not had any more severe episodes of pain but still has a chronic right upper quadrant discomfort. She might have had  a little fever with that episode but otherwise is able to eat. No history of liver disease or cardiovascular disease. She does have asthma and  sleep apnea.   She states she has been known to have gallstones for many years seen on prior imaging studies but this recent episode of pain may have been the first episode of biliary colic. She is just not sure.   Ultrasound performed on 01/31/2012 shows multiple gallstones. No  evidence of inflammation. Normal common bile duct. This right kidney surgically absent.   Her co-morbidities included asthma, hyperlipidemia, hypertension, sleep apnea using CPAP at night, GERD, chronic kidney disease. Prior surgical history includes a radical right nephrectomy and bilateral tubal ligation.     Past Medical History   Diagnosis  Date   .  Asthma     .  GERD (gastroesophageal reflux disease)     .  Hypertension     .  Dermatitis         intermittent   .  Plantar fasciitis     .  Asymptomatic gallstones     .  Colon polyps         hyperplastic   .  Restless legs syndrome (RLS)     .  Osteopenia         mild   .  Obstructive sleep apnea     .  Facet arthropathy, cervical     .  Osteopenia     .  CKD (chronic kidney disease), stage III     .  Cancer  renal cell   .  Hypercholesterolemia     .  Pure hypercholesterolemia     .  Dyslipidemia     .  Osteoarthritis     .  Colon polyps         hyperplastic   .  CKD (chronic kidney disease), stage III     .  Hearing loss     .  Nasal congestion     .  Wheezing     .  Abdominal distention         Past Surgical History   Procedure  Date   .  Lipoma excision     .  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  Tubal ligation  1971   .  Breast surgery  1996       breast biopsy - benign calcifications   .  Breast surgery  1977       adenofibroma removed - 3 biopsy's       Family History   Problem  Relation  Age of Onset   .  Transient ischemic attack  Mother     .  Cancer  Mother         breast   .  Hypertension  Mother     .  Alzheimer's disease  Mother     .  Stroke  Father     .  Colon polyps  Father     .  Cancer  Sister     .  Cancer  Paternal Grandfather         lung      Social History History   Substance Use Topics   .  Smoking status:  Never Smoker    .  Smokeless tobacco:  Never Used   .  Alcohol Use:  Yes         rare       Allergies    Allergen  Reactions   .  Crestor (Rosuvastatin)  Other (See Comments)       Muscle aches, constipation.   .  Sulfa Antibiotics  Itching and Swelling       Face only, breathing not affected.       Current Outpatient Prescriptions   Medication  Sig  Dispense  Refill   .  BESIVANCE 0.6 % SUSP           .  levocetirizine (XYZAL) 5 MG tablet  Daily.         Marland Kitchen  PATANASE 0.6 % SOLN  as needed.         .  prednisoLONE acetate (PRED FORTE) 1 % ophthalmic suspension  as needed.         Marland Kitchen  albuterol (PROVENTIL HFA;VENTOLIN HFA) 108 (90 BASE) MCG/ACT inhaler  Inhale 2 puffs into the lungs as needed.         .  cetirizine (ZYRTEC) 10 MG tablet  Take 10 mg by mouth daily.         .  clonazePAM (KLONOPIN) 1 MG tablet  Take 1 mg by mouth at bedtime. Take 1/2 or 1 tablet at bedtime.         .  Coenzyme Q10 (CO Q-10) 100 MG CAPS  Take by mouth daily.         .  Ergocalciferol (VITAMIN D2) 2000 UNITS TABS  Take by mouth once a week.         Marland Kitchen  fluticasone (FLONASE) 50 MCG/ACT nasal spray  Place 2 sprays into the nose as needed.         .  Fluticasone-Salmeterol (ADVAIR DISKUS) 250-50 MCG/DOSE AEPB  Inhale 1 puff into the lungs every 12 (twelve) hours.         Marland Kitchen  HYDROcodone-acetaminophen (VICODIN) 5-500 MG per tablet  Take 1 tablet by mouth every 6 (six) hours as needed.         .  hydrOXYzine (ATARAX/VISTARIL) 25 MG tablet  Take 25 mg by mouth at bedtime as needed. Take 2 tablets once per day at bedtime as needed.         Marland Kitchen  lisinopril (PRINIVIL,ZESTRIL) 20 MG tablet  Take 20 mg by mouth daily.         .  Multiple Vitamin (MULTIVITAMIN) tablet  Take 1 tablet by mouth daily.         Marland Kitchen  omeprazole (PRILOSEC) 20 MG capsule  Take 20 mg by mouth daily.         .  pravastatin (PRAVACHOL) 40 MG tablet  Take 40 mg by mouth every evening.         .  Triamcinolone Acetonide (TRIAMCINOLONE 0.1 % CREAM : EUCERIN) CREA  Apply topically as needed.            Review of Systems   Constitutional: Negative for fever,  chills and unexpected weight change.  HENT: Negative for hearing loss, congestion, sore throat, trouble swallowing and voice change.   Eyes: Negative for visual disturbance.  Respiratory: Negative for cough and wheezing.   Cardiovascular: Positive for chest pain. Negative for palpitations and leg swelling.  Gastrointestinal: Positive for nausea, vomiting and abdominal pain. Negative for diarrhea, constipation, blood in stool, abdominal distention and anal bleeding.  Genitourinary: Negative for hematuria, vaginal bleeding and difficulty urinating.  Musculoskeletal: Negative for arthralgias.  Skin: Negative for rash and wound.  Neurological: Negative for seizures, syncope and headaches.  Hematological: Negative for adenopathy. Does not bruise/bleed easily.  Psychiatric/Behavioral: Negative for confusion.    Blood pressure 128/78, pulse 72, temperature 97.6 F (36.4 C), temperature source Temporal, resp. rate 16, height 5' 2.5" (1.588 m), weight 166 lb 4 oz (75.411 kg).   Physical Exam  Constitutional: She is oriented to person, place, and time. She appears well-developed and well-nourished. No distress.  HENT:   Head: Normocephalic and atraumatic.   Nose: Nose normal.   Mouth/Throat: No oropharyngeal exudate.  Eyes: Conjunctivae normal and EOM are normal. Pupils are equal, round, and reactive to light. Left eye exhibits no discharge. No scleral icterus.  Neck: Neck supple. No JVD present. No tracheal deviation present. No thyromegaly present.  Cardiovascular: Normal rate, regular rhythm, normal heart sounds and intact distal pulses.    No murmur heard. Pulmonary/Chest: Effort normal and breath sounds normal. No respiratory distress. She has no wheezes. She has no rales. She exhibits no tenderness.  Abdominal: Soft. Bowel sounds are normal. She exhibits no distension and no mass. There is no tenderness. There is no rebound and no guarding.         Upper midline incision which is T'd off  to the right well above the umbilicus. Short incision in the suprapubic area from tubal ligation. No mass. No hernias. Not particularly tender. Not distended.  Musculoskeletal: She exhibits no edema and no tenderness.  Lymphadenopathy:    She has no cervical adenopathy.  Neurological: She is alert and oriented to person, place, and time. She exhibits normal muscle  tone. Coordination normal.  Skin: Skin is warm. No rash noted. She is not diaphoretic. No erythema. No pallor.  Psychiatric: She has a normal mood and affect. Her behavior is normal. Judgment and thought content normal.    Data Reviewed I have reviewed the ultrasound report, recent lab work, and Dr. Michaelle Copas office today. I have requested the operative note and discharge summary from Dr. Nicanor Alcon at [facility] Department of urology.   Assessment Chronic cholecystitis with cholelithiasis. She is now having biliary colic. Elective cholecystectomy is appropriate and is offered to the patient. She very much wants to go ahead with this in the near future.   Status post open right radical nephrectomy. Operative details pending. She will likely have significant adhesions which will complicate cholecystectomy.    Asthma   Sleep apnea   Hypertension   Chronic kidney disease   History tubal ligation   Hyperlipidemia   Plan Obtain operative note and discharge summary from [facility] Department of Urology so I can understand the details of her right nephrectomy   Schedule for laparoscopic cholecystectomy with cholangiogram possible open. She has requested [facility].   I discussed the indications, details, techniques, and numerous risks of the surgery with her. She knows that she is at increased risk of converting to open because of severe adhesions. She understands all these issues. Her questions were answered. She agrees with this plan.       Angelia Mould. Derrell Lolling, M.D., [facility] General and  Minimally invasive Surgery Breast and Colorectal Surgery Office:   [phone] Pager:   [phone]

## 2012-03-16 NOTE — H&P (Signed)
Mackenzie King    MRN: [id number]   Description: 71 year old female  Provider: Ernestene Mention, MD  Department: [facility]      Diagnoses     Gallstones   - Primary    574.20       Vitals    BP Pulse Temp Resp Ht Wt    128/78 72 97.6 F (36.4 C) (Temporal) 16 5' 2.5" (1.588 m) 166 lb 4 oz (75.411 kg)  BMI-29.92 kg/m2                 History and Physical     Ernestene Mention, MD   Patient ID: Mackenzie King, female   DOB: [birth year], 71 y.o.   MRN: [id number]                 HPI Mackenzie King is a 71 y.o. female.  She is referred by Dr. Merri Brunette for evaluation of symptomatic gallstones. Her regular primary care physician is Dr. Laurann Montana, and her cardiologist and physician who  manages her sleep apnea is Dr. Carolanne Grumbling.   This patient underwent open radical right nephrectomy with resection of tumor and clot in the renal vein and IVC at [facility] in January 2013 by Dr. Nicanor Alcon of urology department and Dr. Chauncey Mann of Surgical Oncology.  She did well with that surgery. No known metastasis.   She says she's had some chronic right upper quadrant pain, sort of always in the background. 2 weeks ago she woke up at 4 AM with moderately severe right upper quadrant pain, nausea, 2 episodes of vomiting, and dizziness. This resolved after a few hours. Since that time she's been following a very low-fat diet has not had any more severe episodes of pain but still has a chronic right upper quadrant discomfort. She might have had  a little fever with that episode but otherwise is able to eat. No history of liver disease or cardiovascular disease. She does have asthma and  sleep apnea.   She states she has been known to have gallstones for many years seen on prior imaging studies but this recent episode of pain may have been the first episode of biliary colic. She is just not sure.   Ultrasound performed on 01/31/2012 shows multiple gallstones. No  evidence of inflammation. Normal common bile duct. This right kidney surgically absent.   Her co-morbidities included asthma, hyperlipidemia, hypertension, sleep apnea using CPAP at night, GERD, chronic kidney disease. Prior surgical history includes a radical right nephrectomy and bilateral tubal ligation.     Past Medical History   Diagnosis  Date   .  Asthma     .  GERD (gastroesophageal reflux disease)     .  Hypertension     .  Dermatitis         intermittent   .  Plantar fasciitis     .  Asymptomatic gallstones     .  Colon polyps         hyperplastic   .  Restless legs syndrome (RLS)     .  Osteopenia         mild   .  Obstructive sleep apnea     .  Facet arthropathy, cervical     .  Osteopenia     .  CKD (chronic kidney disease), stage III     .  Cancer         renal cell   .  Hypercholesterolemia     .  Pure hypercholesterolemia     .  Dyslipidemia     .  Osteoarthritis     .  Colon polyps         hyperplastic   .  CKD (chronic kidney disease), stage III     .  Hearing loss     .  Nasal congestion     .  Wheezing     .  Abdominal distention         Past Surgical History   Procedure  Date   .  Lipoma excision     .  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  Tubal ligation  1971   .  Breast surgery  1996       breast biopsy - benign calcifications   .  Breast surgery  1977       adenofibroma removed - 3 biopsy's       Family History   Problem  Relation  Age of Onset   .  Transient ischemic attack  Mother     .  Cancer  Mother         breast   .  Hypertension  Mother     .  Alzheimer's disease  Mother     .  Stroke  Father     .  Colon polyps  Father     .  Cancer  Sister     .  Cancer  Paternal Grandfather         lung      Social History History   Substance Use Topics   .  Smoking status:  Never Smoker    .  Smokeless tobacco:  Never Used   .  Alcohol Use:  Yes         rare       Allergies    Allergen  Reactions   .  Crestor (Rosuvastatin)  Other (See Comments)       Muscle aches, constipation.   .  Sulfa Antibiotics  Itching and Swelling       Face only, breathing not affected.       Current Outpatient Prescriptions   Medication  Sig  Dispense  Refill   .  BESIVANCE 0.6 % SUSP           .  levocetirizine (XYZAL) 5 MG tablet  Daily.         Marland Kitchen  PATANASE 0.6 % SOLN  as needed.         .  prednisoLONE acetate (PRED FORTE) 1 % ophthalmic suspension  as needed.         Marland Kitchen  albuterol (PROVENTIL HFA;VENTOLIN HFA) 108 (90 BASE) MCG/ACT inhaler  Inhale 2 puffs into the lungs as needed.         .  cetirizine (ZYRTEC) 10 MG tablet  Take 10 mg by mouth daily.         .  clonazePAM (KLONOPIN) 1 MG tablet  Take 1 mg by mouth at bedtime. Take 1/2 or 1 tablet at bedtime.         .  Coenzyme Q10 (CO Q-10) 100 MG CAPS  Take by mouth daily.         .  Ergocalciferol (VITAMIN D2) 2000 UNITS TABS  Take by mouth once a week.         Marland Kitchen  fluticasone (FLONASE) 50 MCG/ACT nasal spray  Place 2 sprays into the nose as needed.         .  Fluticasone-Salmeterol (ADVAIR DISKUS) 250-50 MCG/DOSE AEPB  Inhale 1 puff into the lungs every 12 (twelve) hours.         Marland Kitchen  HYDROcodone-acetaminophen (VICODIN) 5-500 MG per tablet  Take 1 tablet by mouth every 6 (six) hours as needed.         .  hydrOXYzine (ATARAX/VISTARIL) 25 MG tablet  Take 25 mg by mouth at bedtime as needed. Take 2 tablets once per day at bedtime as needed.         Marland Kitchen  lisinopril (PRINIVIL,ZESTRIL) 20 MG tablet  Take 20 mg by mouth daily.         .  Multiple Vitamin (MULTIVITAMIN) tablet  Take 1 tablet by mouth daily.         Marland Kitchen  omeprazole (PRILOSEC) 20 MG capsule  Take 20 mg by mouth daily.         .  pravastatin (PRAVACHOL) 40 MG tablet  Take 40 mg by mouth every evening.         .  Triamcinolone Acetonide (TRIAMCINOLONE 0.1 % CREAM : EUCERIN) CREA  Apply topically as needed.            Review of Systems   Constitutional: Negative for fever,  chills and unexpected weight change.  HENT: Negative for hearing loss, congestion, sore throat, trouble swallowing and voice change.   Eyes: Negative for visual disturbance.  Respiratory: Negative for cough and wheezing.   Cardiovascular: Positive for chest pain. Negative for palpitations and leg swelling.  Gastrointestinal: Positive for nausea, vomiting and abdominal pain. Negative for diarrhea, constipation, blood in stool, abdominal distention and anal bleeding.  Genitourinary: Negative for hematuria, vaginal bleeding and difficulty urinating.  Musculoskeletal: Negative for arthralgias.  Skin: Negative for rash and wound.  Neurological: Negative for seizures, syncope and headaches.  Hematological: Negative for adenopathy. Does not bruise/bleed easily.  Psychiatric/Behavioral: Negative for confusion.    Blood pressure 128/78, pulse 72, temperature 97.6 F (36.4 C), temperature source Temporal, resp. rate 16, height 5' 2.5" (1.588 m), weight 166 lb 4 oz (75.411 kg).   Physical Exam   Constitutional: She is oriented to person, place, and time. She appears well-developed and well-nourished. No distress.  HENT:   Head: Normocephalic and atraumatic.   Nose: Nose normal.   Mouth/Throat: No oropharyngeal exudate.  Eyes: Conjunctivae normal and EOM are normal. Pupils are equal, round, and reactive to light. Left eye exhibits no discharge. No scleral icterus.  Neck: Neck supple. No JVD present. No tracheal deviation present. No thyromegaly present.  Cardiovascular: Normal rate, regular rhythm, normal heart sounds and intact distal pulses.    No murmur heard. Pulmonary/Chest: Effort normal and breath sounds normal. No respiratory distress. She has no wheezes. She has no rales. She exhibits no tenderness.  Abdominal: Soft. Bowel sounds are normal. She exhibits no distension and no mass. There is no tenderness. There is no rebound and no guarding.         Upper midline incision which is T'd  off to the right well above the umbilicus. Short incision in the suprapubic area from tubal ligation. No mass. No hernias. Not particularly tender. Not distended.  Musculoskeletal: She exhibits no edema and no tenderness.  Lymphadenopathy:    She has no cervical adenopathy.  Neurological: She is alert and oriented to person, place, and time. She exhibits normal  muscle tone. Coordination normal.  Skin: Skin is warm. No rash noted. She is not diaphoretic. No erythema. No pallor.  Psychiatric: She has a normal mood and affect. Her behavior is normal. Judgment and thought content normal.    Data Reviewed I have reviewed the ultrasound report, recent lab work, and Dr. Michaelle Copas office today. I have requested the operative note and discharge summary from Dr. Nicanor Alcon at [facility] Department of urology.   Assessment Chronic cholecystitis with cholelithiasis. She is now having biliary colic. Elective cholecystectomy is appropriate and is offered to the patient. She very much wants to go ahead with this in the near future.   Status post open right radical nephrectomy. Operative details pending. She will likely have significant adhesions which will complicate cholecystectomy.    Asthma   Sleep apnea   Hypertension   Chronic kidney disease   History tubal ligation   Hyperlipidemia   Plan Obtain operative note and discharge summary from [facility] Department of Urology so I can understand the details of her right nephrectomy   Schedule for laparoscopic cholecystectomy with cholangiogram possible open. She has requested [facility].   I discussed the indications, details, techniques, and numerous risks of the surgery with her. She knows that she is at increased risk of converting to open because of severe adhesions. She understands all these issues. Her questions were answered. She agrees with this plan.       Angelia Mould. Derrell Lolling, M.D., [facility] General and  Minimally invasive Surgery Breast and Colorectal Surgery Office:   [phone] Pager:   [phone]

## 2012-03-19 ENCOUNTER — Encounter (HOSPITAL_COMMUNITY)
Admission: RE | Disposition: A | Discharge status: Home / Self Care | Payer: Self-pay | Source: Ambulatory Visit | Attending: General Surgery

## 2012-03-19 ENCOUNTER — Encounter (HOSPITAL_COMMUNITY): Payer: Self-pay | Admitting: Certified Registered Nurse Anesthetist

## 2012-03-19 ENCOUNTER — Ambulatory Visit (HOSPITAL_COMMUNITY): Payer: Medicare Other | Admitting: Certified Registered Nurse Anesthetist

## 2012-03-19 ENCOUNTER — Inpatient Hospital Stay (HOSPITAL_COMMUNITY)
Admission: RE | Admit: 2012-03-19 | Discharge: 2012-03-23 | DRG: 415 | Disposition: A | Discharge status: Home / Self Care | Payer: Medicare Other | Source: Ambulatory Visit | Attending: General Surgery | Admitting: General Surgery

## 2012-03-19 ENCOUNTER — Encounter (HOSPITAL_COMMUNITY): Payer: Self-pay | Admitting: *Deleted

## 2012-03-19 ENCOUNTER — Ambulatory Visit (HOSPITAL_COMMUNITY): Payer: Medicare Other

## 2012-03-19 DIAGNOSIS — K560 Paralytic ileus: Secondary | ICD-10-CM | POA: Diagnosis not present

## 2012-03-19 DIAGNOSIS — Z5331 Laparoscopic surgical procedure converted to open procedure: Secondary | ICD-10-CM

## 2012-03-19 DIAGNOSIS — K8020 Calculus of gallbladder without cholecystitis without obstruction: Secondary | ICD-10-CM

## 2012-03-19 DIAGNOSIS — Z01812 Encounter for preprocedural laboratory examination: Secondary | ICD-10-CM | POA: Diagnosis not present

## 2012-03-19 DIAGNOSIS — K8010 Calculus of gallbladder with chronic cholecystitis without obstruction: Principal | ICD-10-CM | POA: Diagnosis present

## 2012-03-19 DIAGNOSIS — J9819 Other pulmonary collapse: Secondary | ICD-10-CM | POA: Diagnosis not present

## 2012-03-19 DIAGNOSIS — G4730 Sleep apnea, unspecified: Secondary | ICD-10-CM | POA: Diagnosis present

## 2012-03-19 DIAGNOSIS — I10 Essential (primary) hypertension: Secondary | ICD-10-CM | POA: Diagnosis present

## 2012-03-19 DIAGNOSIS — K219 Gastro-esophageal reflux disease without esophagitis: Secondary | ICD-10-CM | POA: Diagnosis present

## 2012-03-19 DIAGNOSIS — Z905 Acquired absence of kidney: Secondary | ICD-10-CM | POA: Diagnosis not present

## 2012-03-19 DIAGNOSIS — K660 Peritoneal adhesions (postprocedural) (postinfection): Secondary | ICD-10-CM | POA: Diagnosis present

## 2012-03-19 DIAGNOSIS — D126 Benign neoplasm of colon, unspecified: Secondary | ICD-10-CM | POA: Diagnosis not present

## 2012-03-19 HISTORY — PX: CHOLECYSTECTOMY: SHX55

## 2012-03-19 SURGERY — LAPAROSCOPIC CHOLECYSTECTOMY WITH INTRAOPERATIVE CHOLANGIOGRAM
Anesthesia: General | Site: Abdomen | Wound class: Clean Contaminated

## 2012-03-19 MED ORDER — ONDANSETRON HCL 4 MG PO TABS: 4.0000 mg | ORAL_TABLET | Freq: Four times a day (QID) | ORAL | Status: DC | PRN

## 2012-03-19 MED ORDER — HYDROCODONE-ACETAMINOPHEN 5-325 MG PO TABS
1.0000 | ORAL_TABLET | ORAL | Status: DC | PRN
  Administered 2012-03-19 – 2012-03-23 (×7): 2 via ORAL
  Filled 2012-03-19 (×7): qty 2

## 2012-03-19 MED ORDER — TRIAMCINOLONE 0.1 % CREAM:EUCERIN CREAM 1:1
1.0000 "application " | TOPICAL_CREAM | CUTANEOUS | Status: DC | PRN
  Filled 2012-03-19: qty 1

## 2012-03-19 MED ORDER — LORATADINE 10 MG PO TABS
10.0000 mg | ORAL_TABLET | Freq: Every day | ORAL | Status: DC
  Administered 2012-03-20 – 2012-03-22 (×3): 10 mg via ORAL
  Filled 2012-03-19 (×5): qty 1

## 2012-03-19 MED ORDER — CEFAZOLIN SODIUM-DEXTROSE 2-3 GM-% IV SOLR
INTRAVENOUS | Status: AC
  Filled 2012-03-19: qty 50

## 2012-03-19 MED ORDER — BUPIVACAINE-EPINEPHRINE PF 0.25-1:200000 % IJ SOLN
INTRAMUSCULAR | Status: AC
  Filled 2012-03-19: qty 30

## 2012-03-19 MED ORDER — NEOSTIGMINE METHYLSULFATE 1 MG/ML IJ SOLN
INTRAMUSCULAR | Status: DC | PRN
  Administered 2012-03-19: 5 mg via INTRAVENOUS

## 2012-03-19 MED ORDER — LEVOCETIRIZINE DIHYDROCHLORIDE 5 MG PO TABS: 5.0000 mg | ORAL_TABLET | Freq: Every evening | ORAL | Status: DC

## 2012-03-19 MED ORDER — LIDOCAINE HCL (CARDIAC) 20 MG/ML IV SOLN
INTRAVENOUS | Status: DC | PRN
  Administered 2012-03-19: 50 mg via INTRAVENOUS

## 2012-03-19 MED ORDER — BIOTENE DRY MOUTH MT LIQD
15.0000 mL | Freq: Two times a day (BID) | OROMUCOSAL | Status: DC
  Administered 2012-03-19 – 2012-03-22 (×5): 15 mL via OROMUCOSAL

## 2012-03-19 MED ORDER — HEPARIN SODIUM (PORCINE) 5000 UNIT/ML IJ SOLN
5000.0000 [IU] | Freq: Three times a day (TID) | INTRAMUSCULAR | Status: DC
  Administered 2012-03-20 – 2012-03-23 (×9): 5000 [IU] via SUBCUTANEOUS
  Filled 2012-03-19 (×12): qty 1

## 2012-03-19 MED ORDER — FENTANYL CITRATE 0.05 MG/ML IJ SOLN
INTRAMUSCULAR | Status: DC | PRN
  Administered 2012-03-19 (×2): 50 ug via INTRAVENOUS
  Administered 2012-03-19: 100 ug via INTRAVENOUS
  Administered 2012-03-19: 50 ug via INTRAVENOUS

## 2012-03-19 MED ORDER — PANTOPRAZOLE SODIUM 40 MG PO TBEC
40.0000 mg | DELAYED_RELEASE_TABLET | Freq: Every day | ORAL | Status: DC
  Administered 2012-03-20 – 2012-03-21 (×2): 40 mg via ORAL
  Filled 2012-03-19 (×3): qty 1

## 2012-03-19 MED ORDER — FLUTICASONE PROPIONATE 50 MCG/ACT NA SUSP
2.0000 | Freq: Every day | NASAL | Status: DC | PRN
  Filled 2012-03-19: qty 16

## 2012-03-19 MED ORDER — ROCURONIUM BROMIDE 100 MG/10ML IV SOLN
INTRAVENOUS | Status: DC | PRN
  Administered 2012-03-19: 25 mg via INTRAVENOUS
  Administered 2012-03-19: 10 mg via INTRAVENOUS
  Administered 2012-03-19 (×2): 5 mg via INTRAVENOUS

## 2012-03-19 MED ORDER — CHLORHEXIDINE GLUCONATE 4 % EX LIQD: 1.0000 "application " | Freq: Once | CUTANEOUS | Status: DC

## 2012-03-19 MED ORDER — LACTATED RINGERS IV SOLN: INTRAVENOUS | Status: DC

## 2012-03-19 MED ORDER — CHLORHEXIDINE GLUCONATE 0.12 % MT SOLN
15.0000 mL | Freq: Two times a day (BID) | OROMUCOSAL | Status: DC
  Administered 2012-03-19 – 2012-03-23 (×8): 15 mL via OROMUCOSAL
  Filled 2012-03-19 (×11): qty 15

## 2012-03-19 MED ORDER — HYDROXYZINE HCL 25 MG PO TABS
25.0000 mg | ORAL_TABLET | Freq: Every evening | ORAL | Status: DC | PRN
  Filled 2012-03-19: qty 1

## 2012-03-19 MED ORDER — BESIFLOXACIN HCL 0.6 % OP SUSP
1.0000 [drp] | Freq: Every day | OPHTHALMIC | Status: DC
  Administered 2012-03-20 – 2012-03-22 (×2): 1 [drp] via OPHTHALMIC

## 2012-03-19 MED ORDER — CLONAZEPAM 1 MG PO TABS
1.0000 mg | ORAL_TABLET | Freq: Every day | ORAL | Status: DC
  Administered 2012-03-19 – 2012-03-22 (×4): 1 mg via ORAL
  Filled 2012-03-19 (×4): qty 1

## 2012-03-19 MED ORDER — ONDANSETRON HCL 4 MG/2ML IJ SOLN
4.0000 mg | Freq: Four times a day (QID) | INTRAMUSCULAR | Status: DC | PRN
  Administered 2012-03-19 – 2012-03-20 (×2): 4 mg via INTRAVENOUS
  Filled 2012-03-19 (×2): qty 2

## 2012-03-19 MED ORDER — PREDNISOLONE ACETATE 1 % OP SUSP
1.0000 [drp] | Freq: Four times a day (QID) | OPHTHALMIC | Status: DC
  Administered 2012-03-19 – 2012-03-22 (×13): 1 [drp] via OPHTHALMIC
  Filled 2012-03-19: qty 1

## 2012-03-19 MED ORDER — BUPIVACAINE-EPINEPHRINE 0.5% -1:200000 IJ SOLN
INTRAMUSCULAR | Status: DC | PRN
  Administered 2012-03-19: 9 mL

## 2012-03-19 MED ORDER — OLOPATADINE HCL 0.6 % NA SOLN
1.0000 | Freq: Every morning | NASAL | Status: DC
  Administered 2012-03-20 – 2012-03-22 (×2): 2 via NASAL

## 2012-03-19 MED ORDER — MORPHINE SULFATE 2 MG/ML IJ SOLN
2.0000 mg | INTRAMUSCULAR | Status: DC | PRN
  Administered 2012-03-19 – 2012-03-20 (×5): 2 mg via INTRAVENOUS
  Filled 2012-03-19 (×5): qty 1

## 2012-03-19 MED ORDER — FLUTICASONE-SALMETEROL 250-50 MCG/DOSE IN AEPB
1.0000 | INHALATION_SPRAY | Freq: Two times a day (BID) | RESPIRATORY_TRACT | Status: DC
  Administered 2012-03-19 – 2012-03-23 (×5): 1 via RESPIRATORY_TRACT
  Filled 2012-03-19: qty 14

## 2012-03-19 MED ORDER — SUCCINYLCHOLINE CHLORIDE 20 MG/ML IJ SOLN
INTRAMUSCULAR | Status: DC | PRN
  Administered 2012-03-19: 100 mg via INTRAVENOUS

## 2012-03-19 MED ORDER — OXYCODONE-ACETAMINOPHEN 5-325 MG PO TABS: 1.0000 | ORAL_TABLET | ORAL | Status: DC | PRN

## 2012-03-19 MED ORDER — EPHEDRINE SULFATE 50 MG/ML IJ SOLN
INTRAMUSCULAR | Status: DC | PRN
  Administered 2012-03-19: 10 mg via INTRAVENOUS

## 2012-03-19 MED ORDER — SIMVASTATIN 20 MG PO TABS
20.0000 mg | ORAL_TABLET | Freq: Every day | ORAL | Status: DC
  Administered 2012-03-19 – 2012-03-22 (×4): 20 mg via ORAL
  Filled 2012-03-19 (×5): qty 1

## 2012-03-19 MED ORDER — ALBUTEROL SULFATE HFA 108 (90 BASE) MCG/ACT IN AERS
2.0000 | INHALATION_SPRAY | Freq: Four times a day (QID) | RESPIRATORY_TRACT | Status: DC | PRN
  Filled 2012-03-19: qty 6.7

## 2012-03-19 MED ORDER — IOHEXOL 300 MG/ML  SOLN
INTRAMUSCULAR | Status: AC
  Filled 2012-03-19: qty 1

## 2012-03-19 MED ORDER — SIMVASTATIN 10 MG PO TABS: 10.0000 mg | ORAL_TABLET | Freq: Every day | ORAL | Status: DC

## 2012-03-19 MED ORDER — HYDROMORPHONE HCL PF 1 MG/ML IJ SOLN
INTRAMUSCULAR | Status: AC
  Filled 2012-03-19: qty 1

## 2012-03-19 MED ORDER — 0.9 % SODIUM CHLORIDE (POUR BTL) OPTIME
TOPICAL | Status: DC | PRN
  Administered 2012-03-19: 1000 mL

## 2012-03-19 MED ORDER — SODIUM CHLORIDE 0.9 % IR SOLN: Status: DC | PRN

## 2012-03-19 MED ORDER — FENTANYL CITRATE 0.05 MG/ML IJ SOLN
INTRAMUSCULAR | Status: AC
  Filled 2012-03-19: qty 2

## 2012-03-19 MED ORDER — LISINOPRIL 20 MG PO TABS
20.0000 mg | ORAL_TABLET | Freq: Every day | ORAL | Status: DC
  Administered 2012-03-19 – 2012-03-22 (×4): 20 mg via ORAL
  Filled 2012-03-19 (×5): qty 1

## 2012-03-19 MED ORDER — CEFAZOLIN SODIUM-DEXTROSE 2-3 GM-% IV SOLR
2.0000 g | Freq: Three times a day (TID) | INTRAVENOUS | Status: AC
  Administered 2012-03-19 – 2012-03-20 (×3): 2 g via INTRAVENOUS
  Filled 2012-03-19 (×3): qty 50

## 2012-03-19 MED ORDER — PROMETHAZINE HCL 25 MG/ML IJ SOLN: 6.2500 mg | INTRAMUSCULAR | Status: DC | PRN

## 2012-03-19 MED ORDER — GLYCOPYRROLATE 0.2 MG/ML IJ SOLN
INTRAMUSCULAR | Status: DC | PRN
  Administered 2012-03-19: 0.6 mg via INTRAVENOUS

## 2012-03-19 MED ORDER — PROMETHAZINE HCL 25 MG/ML IJ SOLN: 12.5000 mg | Freq: Four times a day (QID) | INTRAMUSCULAR | Status: DC | PRN

## 2012-03-19 MED ORDER — CEFAZOLIN SODIUM-DEXTROSE 2-3 GM-% IV SOLR
2.0000 g | INTRAVENOUS | Status: AC
  Administered 2012-03-19: 2 g via INTRAVENOUS

## 2012-03-19 MED ORDER — POTASSIUM CHLORIDE IN NACL 20-0.9 MEQ/L-% IV SOLN
INTRAVENOUS | Status: DC
  Administered 2012-03-19: 17:00:00 via INTRAVENOUS
  Administered 2012-03-20: 125 mL via INTRAVENOUS
  Administered 2012-03-20: 100 mL via INTRAVENOUS
  Administered 2012-03-20 – 2012-03-21 (×2): via INTRAVENOUS
  Administered 2012-03-21: 75 mL via INTRAVENOUS
  Administered 2012-03-22: 20 mL/h via INTRAVENOUS
  Filled 2012-03-19 (×8): qty 1000

## 2012-03-19 MED ORDER — HYDROMORPHONE HCL PF 1 MG/ML IJ SOLN
0.2500 mg | INTRAMUSCULAR | Status: DC | PRN
  Administered 2012-03-19 (×2): 0.5 mg via INTRAVENOUS

## 2012-03-19 MED ORDER — HEPARIN SODIUM (PORCINE) 5000 UNIT/ML IJ SOLN
5000.0000 [IU] | Freq: Once | INTRAMUSCULAR | Status: AC
  Administered 2012-03-19: 5000 [IU] via SUBCUTANEOUS
  Filled 2012-03-19: qty 1

## 2012-03-19 MED ORDER — BUPIVACAINE-EPINEPHRINE (PF) 0.5% -1:200000 IJ SOLN
INTRAMUSCULAR | Status: AC
  Filled 2012-03-19: qty 10

## 2012-03-19 MED ORDER — FENTANYL CITRATE 0.05 MG/ML IJ SOLN
25.0000 ug | INTRAMUSCULAR | Status: DC | PRN
  Administered 2012-03-19 (×2): 50 ug via INTRAVENOUS

## 2012-03-19 MED ORDER — LACTATED RINGERS IV SOLN
INTRAVENOUS | Status: DC | PRN
  Administered 2012-03-19: 1000 mL

## 2012-03-19 MED ORDER — HYDROMORPHONE HCL PF 1 MG/ML IJ SOLN
INTRAMUSCULAR | Status: DC | PRN
  Administered 2012-03-19: 0.5 mg via INTRAVENOUS
  Administered 2012-03-19: 1 mg via INTRAVENOUS
  Administered 2012-03-19: 0.5 mg via INTRAVENOUS

## 2012-03-19 MED ORDER — PROPOFOL 10 MG/ML IV BOLUS
INTRAVENOUS | Status: DC | PRN
  Administered 2012-03-19: 120 mg via INTRAVENOUS

## 2012-03-19 MED ORDER — ONDANSETRON HCL 4 MG/2ML IJ SOLN
INTRAMUSCULAR | Status: DC | PRN
  Administered 2012-03-19: 4 mg via INTRAVENOUS

## 2012-03-19 MED ORDER — LACTATED RINGERS IV SOLN
INTRAVENOUS | Status: DC | PRN
  Administered 2012-03-19 (×3): via INTRAVENOUS

## 2012-03-19 SURGICAL SUPPLY — 67 items
APPLIER CLIP ROT 10 11.4 M/L (STAPLE) ×2
BENZOIN TINCTURE PRP APPL 2/3 (GAUZE/BANDAGES/DRESSINGS) IMPLANT
CANISTER SUCTION 2500CC (MISCELLANEOUS) ×2 IMPLANT
CLIP APPLIE ROT 10 11.4 M/L (STAPLE) ×1 IMPLANT
CLOTH BEACON ORANGE TIMEOUT ST (SAFETY) ×2 IMPLANT
CORD HIGH FREQUENCY UNIPOLAR (ELECTROSURGICAL) ×2 IMPLANT
COVER MAYO STAND STRL (DRAPES) ×2 IMPLANT
DECANTER SPIKE VIAL GLASS SM (MISCELLANEOUS) ×2 IMPLANT
DERMABOND ADVANCED (GAUZE/BANDAGES/DRESSINGS)
DERMABOND ADVANCED .7 DNX12 (GAUZE/BANDAGES/DRESSINGS) IMPLANT
DRAIN CHANNEL RND F F (WOUND CARE) ×2 IMPLANT
DRAPE C-ARM 42X72 X-RAY (DRAPES) ×2 IMPLANT
DRAPE CAMERA CLOSED 9X96 (DRAPES) ×2 IMPLANT
DRAPE LAPAROSCOPIC ABDOMINAL (DRAPES) ×2 IMPLANT
ELECT BLADE TIP CTD 4 INCH (ELECTRODE) ×2 IMPLANT
ELECT CAUTERY BLADE 6.4 (BLADE) ×2 IMPLANT
ELECT REM PT RETURN 9FT ADLT (ELECTROSURGICAL) ×2
ELECTRODE REM PT RTRN 9FT ADLT (ELECTROSURGICAL) ×1 IMPLANT
EVACUATOR DRAINAGE 10X20 100CC (DRAIN) ×1 IMPLANT
EVACUATOR SILICONE 100CC (DRAIN) ×1
GLOVE BIO SURGEON STRL SZ 6.5 (GLOVE) ×2 IMPLANT
GLOVE BIOGEL PI IND STRL 6.5 (GLOVE) ×1 IMPLANT
GLOVE BIOGEL PI IND STRL 7.0 (GLOVE) ×1 IMPLANT
GLOVE BIOGEL PI IND STRL 7.5 (GLOVE) ×1 IMPLANT
GLOVE BIOGEL PI INDICATOR 6.5 (GLOVE) ×1
GLOVE BIOGEL PI INDICATOR 7.0 (GLOVE) ×1
GLOVE BIOGEL PI INDICATOR 7.5 (GLOVE) ×1
GLOVE EUDERMIC 7 POWDERFREE (GLOVE) ×4 IMPLANT
GLOVE SS BIOGEL STRL SZ 7.5 (GLOVE) ×1 IMPLANT
GLOVE SUPERSENSE BIOGEL SZ 7.5 (GLOVE) ×1
GLOVE SURG SIGNA 7.5 PF LTX (GLOVE) ×4 IMPLANT
GLOVE SURG SS PI 6.5 STRL IVOR (GLOVE) ×2 IMPLANT
GOWN STRL NON-REIN LRG LVL3 (GOWN DISPOSABLE) ×4 IMPLANT
GOWN STRL REIN XL XLG (GOWN DISPOSABLE) ×4 IMPLANT
HEMOSTAT SNOW SURGICEL 2X4 (HEMOSTASIS) IMPLANT
IV LACTATED RINGER IRRG 3000ML (IV SOLUTION)
IV LR IRRIG 3000ML ARTHROMATIC (IV SOLUTION) IMPLANT
KIT BASIN OR (CUSTOM PROCEDURE TRAY) ×2 IMPLANT
NS IRRIG 1000ML POUR BTL (IV SOLUTION) ×2 IMPLANT
PENCIL BUTTON HOLSTER BLD 10FT (ELECTRODE) ×2 IMPLANT
POUCH SPECIMEN RETRIEVAL 10MM (ENDOMECHANICALS) ×2 IMPLANT
SET CHOLANGIOGRAPH MIX (MISCELLANEOUS) ×2 IMPLANT
SET IRRIG TUBING LAPAROSCOPIC (IRRIGATION / IRRIGATOR) ×2 IMPLANT
SOLUTION ANTI FOG 6CC (MISCELLANEOUS) ×2 IMPLANT
SPONGE DRAIN TRACH 4X4 STRL 2S (GAUZE/BANDAGES/DRESSINGS) ×2 IMPLANT
SPONGE GAUZE 4X4 12PLY (GAUZE/BANDAGES/DRESSINGS) ×2 IMPLANT
SPONGE LAP 18X18 X RAY DECT (DISPOSABLE) ×2 IMPLANT
STAPLER VISISTAT 35W (STAPLE) ×2 IMPLANT
STRIP CLOSURE SKIN 1/2X4 (GAUZE/BANDAGES/DRESSINGS) IMPLANT
SUCTION POOLE TIP (SUCTIONS) ×2 IMPLANT
SUT ETHILON 2 0 PS N (SUTURE) ×2 IMPLANT
SUT MNCRL AB 4-0 PS2 18 (SUTURE) ×2 IMPLANT
SUT NOVA 1 T20/GS 25DT (SUTURE) ×4 IMPLANT
SUT PDS AB 1 CTX 36 (SUTURE) ×6 IMPLANT
SUT SILK 2 0 (SUTURE) ×1
SUT SILK 2 0 SH CR/8 (SUTURE) ×2 IMPLANT
SUT SILK 2-0 18XBRD TIE 12 (SUTURE) ×1 IMPLANT
SUT VIC AB 2-0 SH 27 (SUTURE) ×1
SUT VIC AB 2-0 SH 27X BRD (SUTURE) ×1 IMPLANT
SYRINGE IRR TOOMEY STRL 70CC (SYRINGE) IMPLANT
TOWEL OR 17X26 10 PK STRL BLUE (TOWEL DISPOSABLE) ×4 IMPLANT
TRAY LAP CHOLE (CUSTOM PROCEDURE TRAY) ×2 IMPLANT
TROCAR BLADELESS OPT 5 75 (ENDOMECHANICALS) ×4 IMPLANT
TROCAR XCEL BLUNT TIP 100MML (ENDOMECHANICALS) ×2 IMPLANT
TROCAR XCEL NON-BLD 11X100MML (ENDOMECHANICALS) ×2 IMPLANT
TUBING INSUFFLATION 10FT LAP (TUBING) ×2 IMPLANT
YANKAUER SUCT BULB TIP 10FT TU (MISCELLANEOUS) ×2 IMPLANT

## 2012-03-19 NOTE — Progress Notes (Addendum)
Spoke with patient regarding cpap for rest.  Pt stated at times she feels nauseated, but still wants to try and wear cpap tonight.  Pt is using her full face mask and tubing from home.  Pt demonstrated ability to remove her mask as needed.  Pt was unsure of her home settings.  cpap set on 8cm per pt comfort.  2l o2 bledin.  Sterile water added to max fill line of humidity chamber.  HR 67, sats 97%. Pt instructed to let her nurse know if she is unable to tolerate cpap, so that Millard can be reapplied.  RN notified.

## 2012-03-19 NOTE — Anesthesia Preprocedure Evaluation (Signed)
Anesthesia Evaluation  Patient identified by MRN, date of birth, ID band Patient awake    Reviewed: Allergy & Precautions, H&P , NPO status , Patient's Chart, lab work & pertinent test results  Airway Mallampati: III TM Distance: >3 FB Neck ROM: Full    Dental  (+) Teeth Intact, Caps and Dental Advisory Given   Pulmonary neg pulmonary ROS, asthma , sleep apnea ,  breath sounds clear to auscultation  Pulmonary exam normal       Cardiovascular hypertension, Rhythm:Regular Rate:Normal     Neuro/Psych negative neurological ROS  negative psych ROS   GI/Hepatic Neg liver ROS, GERD-  ,  Endo/Other  negative endocrine ROS  Renal/GU Renal disease  negative genitourinary   Musculoskeletal negative musculoskeletal ROS (+)   Abdominal   Peds  Hematology negative hematology ROS (+)   Anesthesia Other Findings   Reproductive/Obstetrics negative OB ROS                           Anesthesia Physical Anesthesia Plan  ASA: II  Anesthesia Plan: General   Post-op Pain Management:    Induction: Intravenous  Airway Management Planned: Oral ETT  Additional Equipment:   Intra-op Plan:   Post-operative Plan: Extubation in OR  Informed Consent: I have reviewed the patients History and Physical, chart, labs and discussed the procedure including the risks, benefits and alternatives for the proposed anesthesia with the patient or authorized representative who has indicated his/her understanding and acceptance.   Dental advisory given  Plan Discussed with: CRNA  Anesthesia Plan Comments:         Anesthesia Quick Evaluation

## 2012-03-19 NOTE — Op Note (Signed)
Patient Name:           Mackenzie King   Date of Surgery:        03/19/2012  Pre op Diagnosis:      Chronic cholecystitis with cholelithiasis  Post op Diagnosis:    Same  Procedure:                 Diagnostic laparoscopy, open cholecystectomy  Surgeon:                     Angelia Mould. Derrell Lolling, M.D., FACS  Assistant:                      Ovidio Kin, M.D., FACS  Operative Indications:   Mackenzie King is a 71 y.o. female. She was red by Dr. Merri Brunette for evaluation of symptomatic gallstones. Her regular primary care physician is Dr. Laurann Montana, and her cardiologist and physician who manages her sleep apnea is Dr. Carolanne Grumbling.  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 Nicanor Alcon of urology department and Dr. Chauncey Mann of Surgical Oncology. She did well with that surgery. No known metastasis.  She says she's had some chronic right upper quadrant pain, sort of always in the background. 2 weeks ago she woke up at 4 AM with moderately severe right upper quadrant pain, nausea, 2 episodes of vomiting, and dizziness. This resolved after a few hours. Since that time she's been following a very low-fat diet has not had any more severe episodes of pain but still has a chronic right upper quadrant discomfort. She might have had a little fever with that episode but otherwise is able to eat. No history of liver disease or cardiovascular disease. She does have asthma and sleep apnea.  She states she has been known to have gallstones for many years seen on prior imaging studies but this recent episode of pain may have been the first episode of biliary colic. She is just not sure.  Ultrasound performed on 01/31/2012 shows multiple gallstones. No evidence of inflammation. Normal common bile duct. This right kidney surgically absent.  Her co-morbidities included asthma, hyperlipidemia, hypertension,  sleep apnea using CPAP at night, GERD, chronic kidney disease. Prior surgical history includes a radical right nephrectomy and bilateral tubal ligation. She is brought to the operating room electively. She is aware that she may have severe adhesions and may require open cholecystectomy.   Operative Findings:       The patient had severe chronic adhesions with the stomach, duodenum, small bowel, and transverse colon and omentum densely adherent to the undersurface of the liver. The gallbladder contained numerous multifaceted stones. The liver was densely adherent to the diaphragm. The surgery had to be performed as an open cholecystectomy due to 2 severe adhesions.  Procedure in Detail:          Following the induction of general endotracheal anesthesia the patient's abdomen was prepped and draped in a sterile fashion. Intravenous antibiotics were given. Surgical time out was performed. An 11 mm Hassan trocar was inserted at the infraumbilical position with an open technique. Entry was uneventful. The Hassan cannula was placed and secured with a pursestring suture of 0 Vicryl. Pneumoperitoneum was created. Video camera\ was ied with findings as described above. Within a few minutes I was able to determine that a laparoscopic approach was not  feasible. The trocar was removed. The fascia at the umbilicus was closed with 0 Vicryl sutures and skin closed with skin staples.  I then made a transverse incision in the right upper quadrant through her old nephrectomy scar. I teed this up in the midline a little bit also in her old scar. Subcutaneous tissue was divided. Anterior rectus sheath, rectus muscle were divided. Laterally I slowly dissected through the muscle layers lateral to the rectus sheath until    was an open area. I entered the free peritoneal space laterally to open that up further laterally to get exposure. I then spent about one hour taking down adhesions from the undersurface of the abdominal wall  both above and below the incision. As I did this I was able to open up the posterior rectus sheath all the way to the midline. I was ultimately able to place a Bookwalter retractor. As I continued the dissection ultimately identified the gallbladder and I could palpate the stones within the gallbladder. I grasped the gallbladder and took it down from the liver from the fundus downward . Blood vessels were controlled with metal clips. It had the dissection all the way down to the cystic duct a clamp off the cystic duct with a right angle clamp and divided the gallbladder and removed it. Irrigated the [employer]. There was no bleeding. I placed 2 metal clips of the cystic duct and also suture ligated with a 2-0 Vicryl suture ligature. Everything looked good. There was no bile leakage. There was no bleeding. The surrounding viscera looked pink and healthy and without any injury. I irrigated out the operative field. A placed a 12 French Blake drain in subhepatic space up to the closure of the cystic duct. This was brought out through a separate stab incision in the right flank and connected to a suction bulb. This was sutured in place. The midline fascia was closed with several interrupted sutures of #1 Novofil., I was able to close the posterior rectus sheath, internal and transversus abdominis with a running suture of #1 PDS. I was to close the anterior rectus sheath and external oblique with running suture of #1 PDS. We irrigated and skin closed with skin staples. The measures were placed and the patient taken to recovery room stable condition. EBL 100 cc. Complications none. Counts correct. Our    [place]. Derrell Lolling, M.D., FACS General and Minimally Invasive Surgery Breast and Colorectal Surgery  03/19/2012 1:16 PM

## 2012-03-19 NOTE — Anesthesia Postprocedure Evaluation (Signed)
  Anesthesia Post-op Note  Patient: Mackenzie King  Procedure(s) Performed: Procedure(s) (LRB) with comments: LAPAROSCOPIC CHOLECYSTECTOMY WITH INTRAOPERATIVE CHOLANGIOGRAM (N/A) CHOLECYSTECTOMY (N/A) - converted to open @ 1137  Patient Location: PACU  Anesthesia Type: General   Level of Consciousness: awake, sedated and patient cooperative  Airway and Oxygen Therapy: Patient Spontanous Breathing and Patient connected to nasal cannula oxygen  Post-op Pain: moderate  Post-op Assessment: Post-op Vital signs reviewed  Post-op Vital Signs: stable  Complications: No apparent anesthesia complications

## 2012-03-19 NOTE — Progress Notes (Signed)
Patient experiencing post-op nausea with vomiting x1. Dose of Zofran given ineffective. Patient relayed percocet makes her nauseated but can tolerate hydrocodone. Dr. Dwain Sarna return page. New orders received  for Phenergan & Hydrocodone. Patient also uses cpap @ hs. Order obtained for cpap.

## 2012-03-19 NOTE — Transfer of Care (Signed)
Immediate Anesthesia Transfer of Care Note  Patient: Mackenzie King  Procedure(s) Performed: Procedure(s) (LRB) with comments: LAPAROSCOPIC CHOLECYSTECTOMY WITH INTRAOPERATIVE CHOLANGIOGRAM (N/A) CHOLECYSTECTOMY (N/A) - converted to open @ 1137  Patient Location: PACU  Anesthesia Type:General  Level of Consciousness: awake and alert   Airway & Oxygen Therapy: Patient Spontanous Breathing and Patient connected to face mask oxygen  Post-op Assessment: Report given to PACU RN and Post -op Vital signs reviewed and stable  Post vital signs: Reviewed and stable  Complications: No apparent anesthesia complications

## 2012-03-19 NOTE — Interval H&P Note (Signed)
History and Physical Interval Note:  03/19/2012 10:35 AM  Mackenzie King  has presented today for surgery, with the diagnosis of gallstones  The goals and the various methods of treatment have been discussed with the patient and family. After consideration of risks, benefits and other options for treatment, the patient has consented to  Procedure(s) (LRB) with comments: LAPAROSCOPIC CHOLECYSTECTOMY WITH INTRAOPERATIVE CHOLANGIOGRAM (N/A) as a surgical intervention .  The patient's history has been reviewed, patient examined today,  no change in status, stable for surgery.  I have reviewed the patient's chart and labs.  Questions were answered to the patient's satisfaction.     Ernestene Mention

## 2012-03-20 ENCOUNTER — Encounter (HOSPITAL_COMMUNITY): Payer: Self-pay | Admitting: General Surgery

## 2012-03-20 LAB — COMPREHENSIVE METABOLIC PANEL
BUN: 13 mg/dL (ref 6–23)
Calcium: 8.4 mg/dL (ref 8.4–10.5)
GFR calc Af Amer: 62 mL/min — ABNORMAL LOW (ref >90)
Glucose, Bld: 121 mg/dL — ABNORMAL HIGH (ref 70–99)
Total Protein: 5.2 g/dL — ABNORMAL LOW (ref 6.0–8.3)

## 2012-03-20 LAB — CBC
HCT: 31.2 % — ABNORMAL LOW (ref 36.0–46.0)
Hemoglobin: 10.4 g/dL — ABNORMAL LOW (ref 12.0–15.0)
RBC: 3.62 MIL/uL — ABNORMAL LOW (ref 3.87–5.11)
WBC: 11.4 10*3/uL — ABNORMAL HIGH (ref 4.0–10.5)

## 2012-03-20 NOTE — Progress Notes (Signed)
1 Day Post-Op  Subjective: Alert And stable. Some nausea and appropriate pain. IV morphine seems effective. No respiratory problems. RT working with CPAP. Comfortable during the night, able to sleep. Good urine output. Foley had to be reinserted.  JP drain working well, low volume, serosanguineous.  Lab work looks good. LFTs normal,Hemoglobin 10.4. Creatinine 1.02, BUN 13.  Objective: Vital signs in last 24 hours: Temp:  [96.9 F (36.1 C)-99.2 F (37.3 C)] 99.2 F (37.3 C) (10/29 0541) Pulse Rate:  [46-78] 74  (10/29 0541) Resp:  [10-20] 20  (10/29 0541) BP: (122-155)/(55-77) 130/69 mmHg (10/29 0541) SpO2:  [93 %-100 %] 95 % (10/29 0541) Weight:  [168 lb (76.204 kg)] 168 lb (76.204 kg) (10/28 1700) Last BM Date: 03/19/12  Intake/Output from previous day: 10/28 0701 - 10/29 0700 In: 3059.6 [I.V.:3039.6] Out: 1540 [Urine:1400; Drains:40; Blood:100] Intake/Output this shift: Total I/O In: 20 [Other:20] Out: 1090 [Urine:1050; Drains:40]  General appearance: overt. Cooperative. Mental status normal. Minimal distress. Resp: clear to auscultation bilaterally GI: dressing clean and intact. Appropriate incisional tenderness right upper quadrant. Rest of abdomen soft, nondistended. JP drainage serosanguineous, nonbilious.  Lab Results:  Results for orders placed during the hospital encounter of 03/19/12 (from the past 24 hour(s))  COMPREHENSIVE METABOLIC PANEL     Status: Abnormal   Collection Time   03/20/12  4:13 AM      Component Value Range   Sodium 133 (*) 135 - 145 mEq/L   Potassium 5.2 (*) 3.5 - 5.1 mEq/L   Chloride 100  96 - 112 mEq/L   CO2 26  19 - 32 mEq/L   Glucose, Bld 121 (*) 70 - 99 mg/dL   BUN 13  6 - 23 mg/dL   Creatinine, Ser 1.61  0.50 - 1.10 mg/dL   Calcium 8.4  8.4 - 09.6 mg/dL   Total Protein 5.2 (*) 6.0 - 8.3 g/dL   Albumin 2.7 (*) 3.5 - 5.2 g/dL   AST 17  0 - 37 U/L   ALT 11  0 - 35 U/L   Alkaline Phosphatase 77  39 - 117 U/L   Total Bilirubin 0.3   0.3 - 1.2 mg/dL   GFR calc non Af Amer 53 (*) >90 mL/min   GFR calc Af Amer 62 (*) >90 mL/min  CBC     Status: Abnormal   Collection Time   03/20/12  4:13 AM      Component Value Range   WBC 11.4 (*) 4.0 - 10.5 K/uL   RBC 3.62 (*) 3.87 - 5.11 MIL/uL   Hemoglobin 10.4 (*) 12.0 - 15.0 g/dL   HCT 04.5 (*) 40.9 - 81.1 %   MCV 86.2  78.0 - 100.0 fL   MCH 28.7  26.0 - 34.0 pg   MCHC 33.3  30.0 - 36.0 g/dL   RDW 91.4  78.2 - 95.6 %   Platelets 269  150 - 400 K/uL     Studies/Results: @RISRSLT24 @     . antiseptic oral rinse  15 mL Mouth Rinse q12n4p  . Besifloxacin HCl  1 drop Both Eyes Daily  .  ceFAZolin (ANCEF) IV  2 g Intravenous 60 min Pre-Op  .  ceFAZolin (ANCEF) IV  2 g Intravenous Q8H  . chlorhexidine  15 mL Mouth Rinse BID  . clonazePAM  1 mg Oral QHS  . fentaNYL      . Fluticasone-Salmeterol  1 puff Inhalation Q12H  . heparin  5,000 Units Subcutaneous Once  . heparin  5,000 Units  Subcutaneous Q8H  . HYDROmorphone      . lisinopril  20 mg Oral QHS  . loratadine  10 mg Oral q1800  . Olopatadine HCl  2 puff Nasal q morning - 10a  . pantoprazole  40 mg Oral Daily  . prednisoLONE acetate  1 drop Both Eyes QID  . simvastatin  20 mg Oral q1800  . DISCONTD: chlorhexidine  1 application Topical Once  . DISCONTD: levocetirizine  5 mg Oral QPM  . DISCONTD: simvastatin  10 mg Oral q1800     Assessment/Plan: s/p Procedure(s): LAPAROSCOPIC CHOLECYSTECTOMY WITH INTRAOPERATIVE CHOLANGIOGRAM CHOLECYSTECTOMY  POD #1. Open cholecystectomy with extensive lysis of adhesions. Stable Ileus, expected Mobilize out of bed Continue Foley until tomorrow Sips and chips only Incentive spirometry.  Asthma, currently asymptomatic Sleep apnea Hypertension, well controlled Status post open right radical nephrectomy, January 2013. Extensive intra-abdominal adhesions necessitating open cholecystectomy yesterday.      LOS: 1 day    Dakoda Laventure M. Derrell Lolling, M.D., [facility] General and Minimally invasive Surgery Breast and Colorectal Surgery Office:   [phone] Pager:   [phone]  03/20/2012  . .prob

## 2012-03-20 NOTE — Progress Notes (Addendum)
Placed pt on cpap for rest, 8cm h2o per pt comfort.  Pt is wearing her full face mask and tubing from home and tolerating well at this time.  RN aware.  Sterile water added to max fill line of humidity chamber.

## 2012-03-21 MED ORDER — CALCIUM CARBONATE ANTACID 500 MG PO CHEW
3.0000 | CHEWABLE_TABLET | Freq: Once | ORAL | Status: DC
  Filled 2012-03-21: qty 3

## 2012-03-21 MED ORDER — BISACODYL 10 MG RE SUPP
10.0000 mg | Freq: Two times a day (BID) | RECTAL | Status: AC
  Administered 2012-03-21: 10 mg via RECTAL
  Filled 2012-03-21 (×2): qty 1

## 2012-03-21 MED ORDER — PANTOPRAZOLE SODIUM 40 MG PO TBEC
40.0000 mg | DELAYED_RELEASE_TABLET | Freq: Two times a day (BID) | ORAL | Status: DC
  Administered 2012-03-21 – 2012-03-22 (×3): 40 mg via ORAL
  Filled 2012-03-21 (×6): qty 1

## 2012-03-21 MED ORDER — ALUM & MAG HYDROXIDE-SIMETH 200-200-20 MG/5ML PO SUSP
30.0000 mL | ORAL | Status: DC | PRN
  Administered 2012-03-21: 30 mL via ORAL
  Filled 2012-03-21: qty 30

## 2012-03-21 NOTE — Progress Notes (Addendum)
2 Days Post-Op  Subjective: Feels better. Ambulated in the hall.. Much less nausea. Tolerate clear liquids reasonably well. No flatus or stool. Foley catheter still in. Slept well. Pain control better.Low-grade fever, 100.3. Coughing some.  J-P drainage thin serosanguineous. Moderate output. Nonbilious.  Objective: Vital signs in last 24 hours: Temp:  [98.9 F (37.2 C)-100.3 F (37.9 C)] 99.8 F (37.7 C) (10/30 0220) Pulse Rate:  [74-93] 90  (10/30 0220) Resp:  [19-20] 20  (10/30 0220) BP: (130-145)/(69-110) 132/76 mmHg (10/30 0220) SpO2:  [91 %-96 %] 94 % (10/30 0220) Last BM Date: 03/19/12  Intake/Output from previous day: 10/29 0701 - 10/30 0700 In: 2400 [P.O.:1200; I.V.:1200] Out: 3200 [Urine:3100; Drains:100] Intake/Output this shift: Total I/O In: 720 [P.O.:720] Out: 1850 [Urine:1750; Drains:100]  General appearance: alert. In no distress. Normal mental status. Skin warm and dry Resp: lungs are clear to auscultation bilaterally and upper lobes. Decreased breath sounds bases. No wheezing.  JP drainage thin, nonbilious.  Lab Results:  No results found for this or any previous visit (from the past 24 hour(s)).   Studies/Results: @RISRSLT24 @     . antiseptic oral rinse  15 mL Mouth Rinse q12n4p  . Besifloxacin HCl  1 drop Both Eyes Daily  .  ceFAZolin (ANCEF) IV  2 g Intravenous Q8H  . chlorhexidine  15 mL Mouth Rinse BID  . clonazePAM  1 mg Oral QHS  . Fluticasone-Salmeterol  1 puff Inhalation Q12H  . heparin  5,000 Units Subcutaneous Q8H  . lisinopril  20 mg Oral QHS  . loratadine  10 mg Oral q1800  . Olopatadine HCl  2 puff Nasal q morning - 10a  . pantoprazole  40 mg Oral Daily  . prednisoLONE acetate  1 drop Both Eyes QID  . simvastatin  20 mg Oral q1800     Assessment/Plan: s/p Procedure(s): LAPAROSCOPIC CHOLECYSTECTOMY WITH INTRAOPERATIVE CHOLANGIOGRAM CHOLECYSTECTOMY  POD #2. Open cholecystectomy with extensive lysis of adhesions.  Stable    Ileus, expected  Mobilize out of bed  Discontinue Foley Advanced to full liquids, give Dulcolax suppository  Fever  likely secondary to atelectasis Incentive spirometry. Ambulate more  Asthma, currently asymptomatic  Sleep apnea  Hypertension, well controlled  Status post open right radical nephrectomy, January 2013.                                                           Extensive intra-abdominal adhesions necessitating open cholecystectomy yesterday     LOS: 2 days    Madysen Faircloth M. Derrell Lolling, M.D., [facility] General and Minimally invasive Surgery Breast and Colorectal Surgery Office:   [phone] Pager:   [phone]  03/21/2012  . .prob

## 2012-03-21 NOTE — Progress Notes (Signed)
RT checked pt CPAP. Pt states that she would like to put on herself and will call if she needs any further assistance.

## 2012-03-22 MED ORDER — POLYETHYLENE GLYCOL 3350 17 G PO PACK
17.0000 g | PACK | Freq: Once | ORAL | Status: DC
  Filled 2012-03-22: qty 1

## 2012-03-22 NOTE — Progress Notes (Signed)
Received report from day nurse, she stated that when she took off dressing, there was still discharge, and reapplied gauze.  Took off gauze towards beginning of shift, was still 2 small spots with discharge, gauze applied to both small spots.  Rechecked gauze, it is clean dry and intact.  Will take off gauze in the morning and reevaluate need for gauze.  [name]

## 2012-03-22 NOTE — Progress Notes (Signed)
3 Days Post-Op  Subjective: Improving. Ambulating in halls, basically independently. Tolerating full liquid diet. Had a suppository but no stool or significant flatus yet. No nausea. Still coughing a little. Urine output high.  Maximum temperature 100.1.  Objective: Vital signs in last 24 hours: Temp:  [98.2 F (36.8 C)-100.1 F (37.8 C)] 98.3 F (36.8 C) (10/31 0600) Pulse Rate:  [80-107] 80  (10/31 0600) Resp:  [18-20] 20  (10/31 0600) BP: (123-166)/(52-78) 154/74 mmHg (10/31 0600) SpO2:  [93 %-98 %] 96 % (10/31 0600) Last BM Date: 03/19/12  Intake/Output from previous day: 10/30 0701 - 10/31 0700 In: 1260 [P.O.:360; I.V.:900] Out: 3150 [Urine:3100; Drains:50] Intake/Output this shift: Total I/O In: -  Out: 1450 [Urine:1400; Drains:50]  General appearance: alert. No distress.  Vital status normal. Ambulating and rhythm. Resp: clear to auscultation bilaterally. No wheezing or rhonchi noted. GI: soft. Not distended. Appropriate incisional tenderness.  Wound clean. JP drainage serosanguineous, nonbilious.  Lab Results:  No results found for this or any previous visit (from the past 24 hour(s)).   Studies/Results: @RISRSLT24 @     . antiseptic oral rinse  15 mL Mouth Rinse q12n4p  . Besifloxacin HCl  1 drop Both Eyes Daily  . bisacodyl  10 mg Rectal BID  . calcium carbonate  3 tablet Oral Once  . chlorhexidine  15 mL Mouth Rinse BID  . clonazePAM  1 mg Oral QHS  . Fluticasone-Salmeterol  1 puff Inhalation Q12H  . heparin  5,000 Units Subcutaneous Q8H  . lisinopril  20 mg Oral QHS  . loratadine  10 mg Oral q1800  . Olopatadine HCl  2 puff Nasal q morning - 10a  . pantoprazole  40 mg Oral BID  . prednisoLONE acetate  1 drop Both Eyes QID  . simvastatin  20 mg Oral q1800  . DISCONTD: pantoprazole  40 mg Oral Daily     Assessment/Plan: s/p Procedure(s): LAPAROSCOPIC CHOLECYSTECTOMY WITH INTRAOPERATIVE CHOLANGIOGRAM CHOLECYSTECTOMY  POD #. Open cholecystectomy  with extensive lysis of adhesions.  Stable  Discussed benign pathology report with the patient.  Ileus, Slow to resolve but not distended Advance diet, Decrease IV rate. MiraLAX Probable discharge tomorrow Possible removal of drain tomorrow  Fever likely secondary to atelectasis, Suspect this will be self-limited Incentive spirometry. Ambulate more   Asthma, currently asymptomatic  Sleep apnea  Hypertension, well controlled  Status post open right radical nephrectomy, January 2013. Extensive intra-abdominal adhesions necessitating open cholecystectomy 03/19/2012.     LOS: 3 days    Carolann Brazell M. Derrell Lolling, M.D., [facility] General and Minimally invasive Surgery Breast and Colorectal Surgery Office:   [phone] Pager:   [phone]  03/22/2012  . .prob

## 2012-03-22 NOTE — Progress Notes (Signed)
Spoke with pt about assistance with CPAP for tonight, pt wishes to self administer when ready. RT will assist as needed.

## 2012-03-23 ENCOUNTER — Telehealth (INDEPENDENT_AMBULATORY_CARE_PROVIDER_SITE_OTHER): Payer: Self-pay | Admitting: General Surgery

## 2012-03-23 MED ORDER — HYDROCODONE-ACETAMINOPHEN 10-500 MG PO TABS: 1.0000 | ORAL_TABLET | Freq: Four times a day (QID) | ORAL | Status: DC | PRN

## 2012-03-23 MED ORDER — POLYETHYLENE GLYCOL 3350 17 G PO PACK
17.0000 g | PACK | Freq: Once | ORAL | Status: AC
  Administered 2012-03-23: 17 g via ORAL
  Filled 2012-03-23: qty 1

## 2012-03-23 NOTE — Progress Notes (Signed)
4 Days Post-Op  Subjective: Doing well and wants to go home. And tolerating regular diet without difficulty. Passing lots of flatus. The stool. Declined use of MiraLAX yesterday. Angulating in halls. Afebrile. Stable vital signs.  Objective: Vital signs in last 24 hours: Temp:  [98 F (36.7 C)-98.8 F (37.1 C)] 98 F (36.7 C) (11/01 0600) Pulse Rate:  [85-96] 96  (11/01 0600) Resp:  [18-20] 18  (11/01 0600) BP: (124-139)/(69-85) 133/69 mmHg (11/01 0600) SpO2:  [94 %-97 %] 97 % (11/01 0600) Last BM Date: 03/22/12  Intake/Output from previous day: 10/31 0701 - 11/01 0700 In: 1600 [I.V.:1600] Out: 2865 [Urine:2800; Drains:65] Intake/Output this shift: Total I/O In: 240 [I.V.:240] Out: 1945 [Urine:1900; Drains:45]  General appearance: pleasant. No distress. Mental status normal. Spirits good. Resp: clear to auscultation bilaterally GI: abdomen soft. Incision looks good. No drainage. Dry. No signs of infection. JP drainage is serous. Drain was removed.  Lab Results:  No results found for this or any previous visit (from the past 24 hour(s)).   Studies/Results: @RISRSLT24 @     . antiseptic oral rinse  15 mL Mouth Rinse q12n4p  . Besifloxacin HCl  1 drop Both Eyes Daily  . bisacodyl  10 mg Rectal BID  . calcium carbonate  3 tablet Oral Once  . chlorhexidine  15 mL Mouth Rinse BID  . clonazePAM  1 mg Oral QHS  . Fluticasone-Salmeterol  1 puff Inhalation Q12H  . heparin  5,000 Units Subcutaneous Q8H  . lisinopril  20 mg Oral QHS  . loratadine  10 mg Oral q1800  . Olopatadine HCl  2 puff Nasal q morning - 10a  . pantoprazole  40 mg Oral BID  . polyethylene glycol  17 g Oral Once  . prednisoLONE acetate  1 drop Both Eyes QID  . simvastatin  20 mg Oral q1800     Assessment/Plan: s/p Procedure(s): LAPAROSCOPIC CHOLECYSTECTOMY WITH INTRAOPERATIVE CHOLANGIOGRAM CHOLECYSTECTOMY  POD #4. Open cholecystectomy with extensive lysis of adhesions.  Doing well.  Drain removed  today Discharge home today Diet and activities discussed Prescription for Vicodin given Return to office one week for staple removal.    Fever likely secondary to atelectasis, Resolved  Asthma, currently asymptomatic  Sleep apnea  Hypertension, well controlled  Status post open right radical nephrectomy, January 2013. Extensive intra-abdominal adhesions necessitating open cholecystectomy 03/19/2012.     LOS: 4 days    Mackenzie King M. Derrell Lolling, M.D., [facility] General and Minimally invasive Surgery Breast and Colorectal Surgery Office:   [phone] Pager:   [phone]  03/23/2012  . .prob

## 2012-03-23 NOTE — Telephone Encounter (Signed)
Called patient to advise of nurse only visit for staple removal on 03/29/12 at 1:30. Patient advised to look out for signs of infection, discharge, fever, etc. Patient agreed.

## 2012-03-23 NOTE — Care Management Note (Signed)
    Page 1 of 1   03/23/2012     9:51:29 AM   CARE MANAGEMENT NOTE 03/23/2012  Patient:  Mackenzie King, Mackenzie King   Account Number:  [id number]  Date Initiated:  03/20/2012  Documentation initiated by:  Lorenda Ishihara  Subjective/Objective Assessment:   71 yo female admitted s/p open chole. PTA lived at home alone.     Action/Plan:   d/c home when stable   Anticipated DC Date:  03/24/2012   Anticipated DC Plan:  HOME W HOME HEALTH SERVICES      DC Planning Services  CM consult      Choice offered to / List presented to:             Status of service:  Completed, signed off Medicare Important Message given?   (If response is "NO", the following Medicare IM given date fields will be blank) Date Medicare IM given:   Date Additional Medicare IM given:    Discharge Disposition:  HOME/SELF CARE  Per UR Regulation:  Reviewed for med. necessity/level of care/duration of stay  If discussed at Long Length of Stay Meetings, dates discussed:    Comments:

## 2012-03-23 NOTE — Progress Notes (Signed)
Per Dr Derrell Lolling, prescription for pain was handed to patient.  Instructions read to patient and patient read back instructions.

## 2012-03-23 NOTE — Discharge Summary (Signed)
Patient ID: Mackenzie King [id number] 71 y.o. 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 female. She was referred by Dr. Merri Brunette for evaluation of symptomatic gallstones. Her regular primary care physician is Dr. Laurann Montana, and her cardiologist and physician who manages her sleep apnea is Dr. Carolanne Grumbling.     This patient underwent open radical right nephrectomy with resection of tumor and clot in the renal vein and IVC at [facility] in January 2013 by Dr. Nicanor Alcon of urology department and Dr. Chauncey Mann of Surgical Oncology. She did well with that surgery. No known metastasis.  She says she's had some chronic right upper quadrant pain, sort of always in the background.     Recently  she woke up at 4 AM with moderately severe right upper quadrant pain, nausea, 2 episodes of vomiting, and dizziness. This resolved after a few hours. Since that time she's been following a very low-fat diet has not had any more severe episodes of pain but still has a chronic right upper quadrant discomfort. She might have had a little fever with that episode but otherwise is able to eat. No history of liver disease or cardiovascular disease. She does have asthma and sleep apnea.  She states she has been known to have gallstones for many years seen on prior imaging studies but this recent episode of pain may have been the first episode of biliary colic. She is just  not sure.  Ultrasound performed on 01/31/2012 shows multiple gallstones. No evidence of inflammation. Normal common bile duct. This right kidney surgically absent. LFT's are normal. Her co-morbidities included asthma, hyperlipidemia, hypertension, sleep apnea using CPAP at night, GERD, chronic kidney disease. Prior surgical history includes a radical right nephrectomy and bilateral tubal ligation. She is brought to the hospital for elective cholecystectomy   Hospital Course: On the day of admission the patient was taken to the operating room. Diagnostic laparoscopy was performed through an infraumbilical open technique. She had severe confluent adhesions in the upper abdomen necessitating open cholecystectomy. I reopened her right subcostal incision and performed extensive lysis of adhesions and cholecystectomy. A drain was left in place. Postoperatively the patient did well, considering the open surgery. She had an ileus for a couple days but that eventually resolved. She progressed in her diet and activities. Her pain was under good control. She voided well. She ambulated independently. On the day of discharge I removed her JP drain. Her wound looks clean there is no signs of infection. Her abdomen was soft. She was given a prescription for Vicodin. She was advised to return to my office to see me in one week for staple removal. Diet and activities were does discussed.  Consults: None  Significant Diagnostic Studies: Pathology, revealing chronic cholecystitis with cholelithiasis  Treatments: surgery: Diagnostic laparoscopy, open cholecystectomy with extensive lysis of adhesions  Disposition: Home  Patient Instructions:   Dallie, Patton  Home Medication Instructions WUJ:[id number]   Printed on:03/23/12 9562  Medication Information                    cetirizine (ZYRTEC)  10 MG tablet Take 10 mg by mouth at bedtime.            Coenzyme Q10 (CO Q-10) 100 MG CAPS Take by mouth daily.             albuterol (PROVENTIL HFA;VENTOLIN HFA) 108 (90 BASE) MCG/ACT inhaler Inhale 2 puffs into the lungs every 6 (six) hours as needed. For shortness of breath           fluticasone (FLONASE) 50 MCG/ACT nasal spray Place 2 sprays into the nose daily as needed. For allergies           hydrOXYzine (ATARAX/VISTARIL) 25 MG tablet Take 25 mg by mouth at bedtime as needed. Take 2 tablets once per day at bedtime as needed.           Triamcinolone Acetonide (TRIAMCINOLONE 0.1 % CREAM : EUCERIN) CREA Apply 1 application topically as needed. Apply to ankles as needed           Fluticasone-Salmeterol (ADVAIR DISKUS) 250-50 MCG/DOSE AEPB Inhale 1 puff into the lungs every 12 (twelve) hours.           pravastatin (PRAVACHOL) 40 MG tablet Take 40 mg by mouth every evening.           omeprazole (PRILOSEC) 20 MG capsule Take 20 mg by mouth daily as needed.            lisinopril (PRINIVIL,ZESTRIL) 20 MG tablet Take 20 mg by mouth at bedtime.            clonazePAM (KLONOPIN) 1 MG tablet Take 1 mg by mouth at bedtime.            HYDROcodone-acetaminophen (VICODIN) 5-500 MG per tablet Take 1 tablet by mouth every 6 (six) hours as needed. For pain           BESIVANCE 0.6 % SUSP Place 1 drop into both eyes daily.            levocetirizine (XYZAL) 5 MG tablet Take 5 mg by mouth every evening.            PATANASE 0.6 % SOLN Place 1 puff into the nose as needed.            Cholecalciferol (VITAMIN D) 2000 UNITS tablet Take 2,000 Units by mouth at bedtime.            Calcium 500 MG CHEW Chew 1 tablet by mouth 3 (three) times daily.           Krill Oil Omega-3 300 MG CAPS Take 1 tablet by mouth daily.           prednisoLONE acetate (PRED FORTE) 1 % ophthalmic suspension Place 1 drop into both eyes 4 (four) times daily.           HYDROcodone-acetaminophen (LORTAB 10) 10-500 MG per tablet Take 1 tablet by mouth every 6 (six) hours as needed for pain.             Activity: no sports or heavy  lifting for 4-6 weeks. Okay to drive car in one week. Okay to shower. Diet: low fat, low cholesterol diet Wound Care: as directed  Follow-up:  With Dr. Derrell Lolling in 1 week.  Signed: Angelia Mould. Derrell Lolling, M.D., FACS General and minimally invasive surgery Breast and Colorectal Surgery  03/23/2012, 6:42 AM

## 2012-03-23 NOTE — Progress Notes (Signed)
Pt's dressings taken off, no signs of bleeding noticed.  [name]

## 2012-03-29 ENCOUNTER — Ambulatory Visit (INDEPENDENT_AMBULATORY_CARE_PROVIDER_SITE_OTHER): Payer: Medicare Other

## 2012-03-29 DIAGNOSIS — K8020 Calculus of gallbladder without cholecystitis without obstruction: Secondary | ICD-10-CM

## 2012-03-29 NOTE — Progress Notes (Signed)
Pt is here today for staple removal.  She is po open cholecystectomy.  Her incision is healing well.  Staples were removed and steri strips placed on incision.  Pt has a po appt scheduled with Dr. Derrell Lolling.

## 2012-04-02 ENCOUNTER — Encounter (INDEPENDENT_AMBULATORY_CARE_PROVIDER_SITE_OTHER): Payer: Self-pay | Admitting: General Surgery

## 2012-04-02 ENCOUNTER — Ambulatory Visit (INDEPENDENT_AMBULATORY_CARE_PROVIDER_SITE_OTHER): Payer: Medicare Other | Admitting: General Surgery

## 2012-04-02 VITALS — BP 156/70 | HR 73 | Temp 98.4°F | Ht 62.5 in | Wt 167.0 lb

## 2012-04-02 DIAGNOSIS — K8020 Calculus of gallbladder without cholecystitis without obstruction: Secondary | ICD-10-CM

## 2012-04-02 NOTE — Progress Notes (Signed)
Patient ID: Mackenzie King, female   DOB: 02/22/1941, 71 y.o.   MRN: [id number] History: This patient underwent diagnostic laparoscopy, open extensive lysis of adhesions, and open cholecystectomy on 03/20/2011. Final pathology report shows chronic cholecystitis with cholelithiasis. Surgery was complicated by extensive adhesions related to her radical right nephrectomy performed 10 months ago in [place]. Fortunately, she appears to be recovering uneventfully. Still sore. Still fatigued but now driving her car and becoming a little more active socially. Appetite normal. Bowel function normal. No wound problems.  Exam: Patient looks well. No distress. Vital signs stable. Abdomen soft. Trocar sites and right subcostal incision healing without any sign of infection or hernia.  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 No sports, lifting, or water aerobics for 5-6 weeks from the date of surgery,  otherwise she do what is comfortable Return to see me in 2 months for a wound check.   Angelia Mould. Derrell Lolling, M.D., [facility] General and Minimally invasive Surgery Breast and Colorectal Surgery Office:   [phone] Pager:   [phone]

## 2012-04-02 NOTE — Patient Instructions (Signed)
You appear to be recovering from your open gallbladder operation without any obvious complications. You are experiencing a normal amount of pain and fatigue.  I encourage  you to take a walk daily.  No heavy lifting, sports, or water aerobics for 5-6 weeks from the date of surgery.  Return to see Dr. Derrell Lolling in 2 months for a wound check.

## 2012-05-09 DIAGNOSIS — G4733 Obstructive sleep apnea (adult) (pediatric): Secondary | ICD-10-CM | POA: Diagnosis not present

## 2012-05-09 DIAGNOSIS — E669 Obesity, unspecified: Secondary | ICD-10-CM | POA: Diagnosis not present

## 2012-05-09 DIAGNOSIS — I10 Essential (primary) hypertension: Secondary | ICD-10-CM | POA: Diagnosis not present

## 2012-05-30 DIAGNOSIS — Z23 Encounter for immunization: Secondary | ICD-10-CM | POA: Diagnosis not present

## 2012-05-30 DIAGNOSIS — Z1331 Encounter for screening for depression: Secondary | ICD-10-CM | POA: Diagnosis not present

## 2012-05-30 DIAGNOSIS — G2581 Restless legs syndrome: Secondary | ICD-10-CM | POA: Diagnosis not present

## 2012-05-30 DIAGNOSIS — J45909 Unspecified asthma, uncomplicated: Secondary | ICD-10-CM | POA: Diagnosis not present

## 2012-05-30 DIAGNOSIS — I129 Hypertensive chronic kidney disease with stage 1 through stage 4 chronic kidney disease, or unspecified chronic kidney disease: Secondary | ICD-10-CM | POA: Diagnosis not present

## 2012-05-30 DIAGNOSIS — E785 Hyperlipidemia, unspecified: Secondary | ICD-10-CM | POA: Diagnosis not present

## 2012-05-30 DIAGNOSIS — L299 Pruritus, unspecified: Secondary | ICD-10-CM | POA: Diagnosis not present

## 2012-05-31 ENCOUNTER — Ambulatory Visit (INDEPENDENT_AMBULATORY_CARE_PROVIDER_SITE_OTHER): Payer: Medicare Other | Admitting: General Surgery

## 2012-05-31 ENCOUNTER — Encounter (INDEPENDENT_AMBULATORY_CARE_PROVIDER_SITE_OTHER): Payer: Self-pay | Admitting: General Surgery

## 2012-05-31 VITALS — BP 128/72 | HR 74 | Temp 98.5°F | Resp 18 | Ht 62.5 in | Wt 168.5 lb

## 2012-05-31 DIAGNOSIS — K8020 Calculus of gallbladder without cholecystitis without obstruction: Secondary | ICD-10-CM

## 2012-05-31 NOTE — Progress Notes (Signed)
Patient ID: Mackenzie King, female   DOB: [birth year], 72 y.o.   MRN: [id number] History: This patient underwent diagnostic laparoscopy, open extensive lysis of adhesions and open cholecystectomy on 03/20/2011 for histologically confirmed chronic cholecystitis with cholelithiasis. Past history is significant for radical right nephrectomy 10 months previously in [facility] She is recovering without major complications. She has incisional pain. Appetite normal. Bowel function is normal. Concerned about her weight and wants to lose weight. Also admits to being depressed because she's had 2 major operations and she lost her husband this year to cancer.   She has discussed with this with Dr. Laurann Montana.  Exam: Patient looks well. No distress. Abdomen soft. All wounds well healed. 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 Weight loss and exercise  encouraged Encouraged counseling for her depression. Hospice is available to her because of her husband's diagnosis Return to see me when necessary    .Mackenzie King. Derrell Lolling, M.D., [facility] General and Minimally invasive Surgery Breast and Colorectal Surgery Office:   [phone] Pager:   [phone]

## 2012-05-31 NOTE — Patient Instructions (Signed)
Your abdominal incision has healed well without any complications. The pain that you have in the incision is not unusual and should slowly improve over the next 6-12 months.  You should get  daily exercise such as walking 30 minutes a day.   follow a high-fiber, low-fat diet.  Return to see Dr. Derrell Lolling if further problems arise.

## 2012-06-15 DIAGNOSIS — J3089 Other allergic rhinitis: Secondary | ICD-10-CM | POA: Diagnosis not present

## 2012-06-15 DIAGNOSIS — J45909 Unspecified asthma, uncomplicated: Secondary | ICD-10-CM | POA: Diagnosis not present

## 2012-06-15 DIAGNOSIS — H1045 Other chronic allergic conjunctivitis: Secondary | ICD-10-CM | POA: Diagnosis not present

## 2012-06-15 DIAGNOSIS — L299 Pruritus, unspecified: Secondary | ICD-10-CM | POA: Diagnosis not present

## 2012-07-13 DIAGNOSIS — Z86718 Personal history of other venous thrombosis and embolism: Secondary | ICD-10-CM | POA: Diagnosis not present

## 2012-07-13 DIAGNOSIS — Z85528 Personal history of other malignant neoplasm of kidney: Secondary | ICD-10-CM | POA: Diagnosis not present

## 2012-07-13 DIAGNOSIS — R918 Other nonspecific abnormal finding of lung field: Secondary | ICD-10-CM | POA: Diagnosis not present

## 2012-07-13 DIAGNOSIS — R935 Abnormal findings on diagnostic imaging of other abdominal regions, including retroperitoneum: Secondary | ICD-10-CM | POA: Diagnosis not present

## 2012-11-08 DIAGNOSIS — I129 Hypertensive chronic kidney disease with stage 1 through stage 4 chronic kidney disease, or unspecified chronic kidney disease: Secondary | ICD-10-CM | POA: Diagnosis not present

## 2012-11-08 DIAGNOSIS — G4733 Obstructive sleep apnea (adult) (pediatric): Secondary | ICD-10-CM | POA: Diagnosis not present

## 2012-11-08 DIAGNOSIS — E669 Obesity, unspecified: Secondary | ICD-10-CM | POA: Diagnosis not present

## 2012-11-21 DIAGNOSIS — Z803 Family history of malignant neoplasm of breast: Secondary | ICD-10-CM | POA: Diagnosis not present

## 2012-11-21 DIAGNOSIS — Z1231 Encounter for screening mammogram for malignant neoplasm of breast: Secondary | ICD-10-CM | POA: Diagnosis not present

## 2012-11-29 DIAGNOSIS — M533 Sacrococcygeal disorders, not elsewhere classified: Secondary | ICD-10-CM | POA: Diagnosis not present

## 2012-11-29 DIAGNOSIS — G2581 Restless legs syndrome: Secondary | ICD-10-CM | POA: Diagnosis not present

## 2012-11-29 DIAGNOSIS — E785 Hyperlipidemia, unspecified: Secondary | ICD-10-CM | POA: Diagnosis not present

## 2012-11-29 DIAGNOSIS — I129 Hypertensive chronic kidney disease with stage 1 through stage 4 chronic kidney disease, or unspecified chronic kidney disease: Secondary | ICD-10-CM | POA: Diagnosis not present

## 2012-12-06 ENCOUNTER — Ambulatory Visit
Admission: RE | Admit: 2012-12-06 | Discharge: 2012-12-06 | Disposition: A | Discharge status: Home / Self Care | Payer: Medicare Other | Source: Ambulatory Visit | Attending: Family Medicine | Admitting: Family Medicine

## 2012-12-06 ENCOUNTER — Other Ambulatory Visit: Payer: Self-pay | Admitting: Family Medicine

## 2012-12-06 DIAGNOSIS — M533 Sacrococcygeal disorders, not elsewhere classified: Secondary | ICD-10-CM | POA: Diagnosis not present

## 2012-12-15 DIAGNOSIS — M533 Sacrococcygeal disorders, not elsewhere classified: Secondary | ICD-10-CM | POA: Diagnosis not present

## 2012-12-24 DIAGNOSIS — R350 Frequency of micturition: Secondary | ICD-10-CM | POA: Diagnosis not present

## 2012-12-24 DIAGNOSIS — N3000 Acute cystitis without hematuria: Secondary | ICD-10-CM | POA: Diagnosis not present

## 2013-01-11 DIAGNOSIS — J45909 Unspecified asthma, uncomplicated: Secondary | ICD-10-CM | POA: Diagnosis not present

## 2013-01-11 DIAGNOSIS — J3089 Other allergic rhinitis: Secondary | ICD-10-CM | POA: Diagnosis not present

## 2013-01-11 DIAGNOSIS — H1045 Other chronic allergic conjunctivitis: Secondary | ICD-10-CM | POA: Diagnosis not present

## 2013-01-11 DIAGNOSIS — L299 Pruritus, unspecified: Secondary | ICD-10-CM | POA: Diagnosis not present

## 2013-01-23 DIAGNOSIS — D235 Other benign neoplasm of skin of trunk: Secondary | ICD-10-CM | POA: Diagnosis not present

## 2013-01-23 DIAGNOSIS — L820 Inflamed seborrheic keratosis: Secondary | ICD-10-CM | POA: Diagnosis not present

## 2013-01-23 DIAGNOSIS — L570 Actinic keratosis: Secondary | ICD-10-CM | POA: Diagnosis not present

## 2013-02-08 DIAGNOSIS — E559 Vitamin D deficiency, unspecified: Secondary | ICD-10-CM | POA: Diagnosis not present

## 2013-02-08 DIAGNOSIS — M2550 Pain in unspecified joint: Secondary | ICD-10-CM | POA: Diagnosis not present

## 2013-02-08 DIAGNOSIS — IMO0001 Reserved for inherently not codable concepts without codable children: Secondary | ICD-10-CM | POA: Diagnosis not present

## 2013-02-08 DIAGNOSIS — Z23 Encounter for immunization: Secondary | ICD-10-CM | POA: Diagnosis not present

## 2013-02-08 DIAGNOSIS — R5381 Other malaise: Secondary | ICD-10-CM | POA: Diagnosis not present

## 2013-02-12 DIAGNOSIS — E559 Vitamin D deficiency, unspecified: Secondary | ICD-10-CM | POA: Diagnosis not present

## 2013-02-12 DIAGNOSIS — R5381 Other malaise: Secondary | ICD-10-CM | POA: Diagnosis not present

## 2013-02-12 DIAGNOSIS — IMO0001 Reserved for inherently not codable concepts without codable children: Secondary | ICD-10-CM | POA: Diagnosis not present

## 2013-03-14 DIAGNOSIS — IMO0001 Reserved for inherently not codable concepts without codable children: Secondary | ICD-10-CM | POA: Diagnosis not present

## 2013-03-14 DIAGNOSIS — M545 Low back pain: Secondary | ICD-10-CM | POA: Diagnosis not present

## 2013-03-14 DIAGNOSIS — F329 Major depressive disorder, single episode, unspecified: Secondary | ICD-10-CM | POA: Diagnosis not present

## 2013-03-29 DIAGNOSIS — C649 Malignant neoplasm of unspecified kidney, except renal pelvis: Secondary | ICD-10-CM | POA: Diagnosis not present

## 2013-03-29 DIAGNOSIS — Z09 Encounter for follow-up examination after completed treatment for conditions other than malignant neoplasm: Secondary | ICD-10-CM | POA: Diagnosis not present

## 2013-03-29 DIAGNOSIS — Z85528 Personal history of other malignant neoplasm of kidney: Secondary | ICD-10-CM | POA: Diagnosis not present

## 2013-03-29 DIAGNOSIS — Z86718 Personal history of other venous thrombosis and embolism: Secondary | ICD-10-CM | POA: Diagnosis not present

## 2013-03-29 DIAGNOSIS — R911 Solitary pulmonary nodule: Secondary | ICD-10-CM | POA: Diagnosis not present

## 2013-03-29 DIAGNOSIS — Z905 Acquired absence of kidney: Secondary | ICD-10-CM | POA: Diagnosis not present

## 2013-04-01 ENCOUNTER — Ambulatory Visit
Admission: RE | Admit: 2013-04-01 | Discharge: 2013-04-01 | Disposition: A | Discharge status: Home / Self Care | Payer: Medicare Other | Source: Ambulatory Visit | Attending: Rheumatology | Admitting: Rheumatology

## 2013-04-01 ENCOUNTER — Other Ambulatory Visit: Payer: Self-pay | Admitting: Rheumatology

## 2013-04-01 DIAGNOSIS — G4700 Insomnia, unspecified: Secondary | ICD-10-CM | POA: Diagnosis not present

## 2013-04-01 DIAGNOSIS — M542 Cervicalgia: Secondary | ICD-10-CM

## 2013-04-01 DIAGNOSIS — M2550 Pain in unspecified joint: Secondary | ICD-10-CM | POA: Diagnosis not present

## 2013-04-01 DIAGNOSIS — M159 Polyosteoarthritis, unspecified: Secondary | ICD-10-CM | POA: Diagnosis not present

## 2013-05-02 DIAGNOSIS — N393 Stress incontinence (female) (male): Secondary | ICD-10-CM | POA: Diagnosis not present

## 2013-05-02 DIAGNOSIS — N318 Other neuromuscular dysfunction of bladder: Secondary | ICD-10-CM | POA: Diagnosis not present

## 2013-05-02 DIAGNOSIS — IMO0001 Reserved for inherently not codable concepts without codable children: Secondary | ICD-10-CM | POA: Diagnosis not present

## 2013-05-02 DIAGNOSIS — F329 Major depressive disorder, single episode, unspecified: Secondary | ICD-10-CM | POA: Diagnosis not present

## 2013-05-09 ENCOUNTER — Encounter: Payer: Self-pay | Admitting: General Surgery

## 2013-05-09 DIAGNOSIS — G4733 Obstructive sleep apnea (adult) (pediatric): Secondary | ICD-10-CM | POA: Insufficient documentation

## 2013-05-09 DIAGNOSIS — E669 Obesity, unspecified: Secondary | ICD-10-CM | POA: Insufficient documentation

## 2013-05-10 ENCOUNTER — Ambulatory Visit (INDEPENDENT_AMBULATORY_CARE_PROVIDER_SITE_OTHER): Payer: Medicare Other | Admitting: Cardiology

## 2013-05-10 ENCOUNTER — Encounter: Payer: Self-pay | Admitting: Cardiology

## 2013-05-10 VITALS — BP 133/74 | HR 63 | Wt 169.0 lb

## 2013-05-10 DIAGNOSIS — G4733 Obstructive sleep apnea (adult) (pediatric): Secondary | ICD-10-CM | POA: Diagnosis not present

## 2013-05-10 DIAGNOSIS — I10 Essential (primary) hypertension: Secondary | ICD-10-CM

## 2013-05-10 NOTE — Progress Notes (Signed)
[address] [place], [place]  [address] Phone: [phone] Fax:  [phone]  Date:  05/10/2013   ID:  Mackenzie King, Mackenzie King 11-05-1940, MRN [id number]  PCP:  Cala Bradford, MD  Sleep Medicine:  Armanda Magic, MD   History of Present Illness: Mackenzie King is a 72 y.o. female with a history of OSA, HTN who presents today for followup.  She is using a full face mask which she tolerates well.  She feels the pressure is adequate.  She occasionally feels rested when she gets up in the am.  She wakes up 3-4 times at night to urinate.  She has no daytime sleepiness.     Wt Readings from Last 3 Encounters:  05/10/13 169 lb (76.658 kg)  05/31/12 168 lb 8 oz (76.431 kg)  04/02/12 167 lb (75.751 kg)     Past Medical History  Diagnosis Date  . Asthma   . GERD (gastroesophageal reflux disease)   . Hypertension   . Dermatitis     intermittent Dr Jerold Coombe  . Plantar fasciitis     s/p shock tx Dr Charlsie Merles  . Asymptomatic gallstones   . Colon polyps     hyperplastic  . Restless legs syndrome (RLS)   . Osteopenia     mild  . Obstructive sleep apnea   . Facet arthropathy, cervical   . CKD (chronic kidney disease), stage III     Dr Lowell Guitar  . Cancer 12/12    renal cell (Dr Susy Frizzle)  . Pure hypercholesterolemia   . Osteoarthritis   . Hearing loss   . Nasal congestion   . Wheezing   . Abdominal distention     Current Outpatient Prescriptions  Medication Sig Dispense Refill  . albuterol (PROVENTIL HFA;VENTOLIN HFA) 108 (90 BASE) MCG/ACT inhaler Inhale 2 puffs into the lungs every 6 (six) hours as needed. For shortness of breath      . Calcium 500 MG CHEW Chew 1 tablet by mouth 3 (three) times daily.      . cetirizine (ZYRTEC) 10 MG tablet Take 10 mg by mouth at bedtime.       . Cholecalciferol (VITAMIN D) 2000 UNITS tablet Take 2,000 Units by mouth at bedtime.       . clonazePAM (KLONOPIN) 1 MG tablet Take 1 mg by mouth at bedtime.       . Coenzyme Q10 (CO Q-10) 100 MG CAPS  Take by mouth daily.      . fluticasone (FLONASE) 50 MCG/ACT nasal spray Place 2 sprays into the nose daily as needed. For allergies      . Fluticasone-Salmeterol (ADVAIR DISKUS) 250-50 MCG/DOSE AEPB Inhale 1 puff into the lungs every 12 (twelve) hours.      Marland Kitchen HYDROcodone-acetaminophen (LORTAB 10) 10-500 MG per tablet Take 1 tablet by mouth every 6 (six) hours as needed for pain.  30 tablet  0  . HYDROcodone-acetaminophen (VICODIN) 5-500 MG per tablet Take 1 tablet by mouth every 6 (six) hours as needed. For pain      . hydrOXYzine (ATARAX/VISTARIL) 25 MG tablet Take 25 mg by mouth at bedtime as needed. Take 2 tablets once per day at bedtime as needed.      Boris Lown Oil Omega-3 300 MG CAPS Take 1 tablet by mouth daily.      Marland Kitchen levocetirizine (XYZAL) 5 MG tablet Take 5 mg by mouth every evening.       Marland Kitchen lisinopril (PRINIVIL,ZESTRIL) 20 MG tablet Take 20 mg  by mouth at bedtime.       Marland Kitchen omeprazole (PRILOSEC) 20 MG capsule Take 20 mg by mouth daily as needed.       Marland Kitchen PATANASE 0.6 % SOLN Place 1 puff into the nose as needed.       . Triamcinolone Acetonide (TRIAMCINOLONE 0.1 % CREAM : EUCERIN) CREA Apply 1 application topically as needed. Apply to ankles as needed       No current facility-administered medications for this visit.    Allergies:    Allergies  Allergen Reactions  . Crestor [Rosuvastatin] Other (See Comments)    Muscle aches, constipation.  . Sulfa Antibiotics Itching and Swelling    Face only, breathing not affected.    Social History:  The patient  reports that she has never smoked. She has never used smokeless tobacco. She reports that she drinks alcohol. She reports that she does not use illicit drugs.   Family History:  The patient's family history includes Alzheimer's disease in her mother; Cancer in her mother, paternal grandfather, and sister; Colon polyps in her father; Hypertension in her mother; Stroke in her father; Transient ischemic attack in her mother.   ROS:   Please see the history of present illness.      All other systems reviewed and negative.   PHYSICAL EXAM: VS:  BP 133/74  Pulse 63  Wt 169 lb (76.658 kg) Well nourished, well developed, in no acute distress HEENT: normal Neck: no JVD Cardiac:  normal S1, S2; RRR; no murmur Lungs:  clear to auscultation bilaterally, no wheezing, rhonchi or rales Abd: soft, nontender, no hepatomegaly Ext: trace edema Skin: warm and dry Neuro:  CNs 2-12 intact, no focal abnormalities noted       ASSESSMENT AND PLAN:  1. OSA on CPAP  - I will get a download from her DME 2. HTN - well controlled  - continue Lisinopril  Followup with me in 6 months  Signed, Armanda Magic, MD 05/10/2013 10:20 AM

## 2013-05-10 NOTE — Patient Instructions (Signed)
Your physician recommends that you continue on your current medications as directed. Please refer to the Current Medication list given to you today.  Your physician wants you to follow-up in: 6 months with Dr Turner You will receive a reminder letter in the mail two months in advance. If you don't receive a letter, please call our office to schedule the follow-up appointment.  

## 2013-05-31 DIAGNOSIS — F329 Major depressive disorder, single episode, unspecified: Secondary | ICD-10-CM | POA: Diagnosis not present

## 2013-05-31 DIAGNOSIS — G2581 Restless legs syndrome: Secondary | ICD-10-CM | POA: Diagnosis not present

## 2013-05-31 DIAGNOSIS — IMO0001 Reserved for inherently not codable concepts without codable children: Secondary | ICD-10-CM | POA: Diagnosis not present

## 2013-05-31 DIAGNOSIS — N183 Chronic kidney disease, stage 3 unspecified: Secondary | ICD-10-CM | POA: Diagnosis not present

## 2013-05-31 DIAGNOSIS — H9190 Unspecified hearing loss, unspecified ear: Secondary | ICD-10-CM | POA: Diagnosis not present

## 2013-05-31 DIAGNOSIS — E785 Hyperlipidemia, unspecified: Secondary | ICD-10-CM | POA: Diagnosis not present

## 2013-05-31 DIAGNOSIS — I129 Hypertensive chronic kidney disease with stage 1 through stage 4 chronic kidney disease, or unspecified chronic kidney disease: Secondary | ICD-10-CM | POA: Diagnosis not present

## 2013-06-06 DIAGNOSIS — Z905 Acquired absence of kidney: Secondary | ICD-10-CM | POA: Diagnosis not present

## 2013-06-06 DIAGNOSIS — I10 Essential (primary) hypertension: Secondary | ICD-10-CM | POA: Diagnosis not present

## 2013-06-06 DIAGNOSIS — Z85528 Personal history of other malignant neoplasm of kidney: Secondary | ICD-10-CM | POA: Diagnosis not present

## 2013-06-12 DIAGNOSIS — H903 Sensorineural hearing loss, bilateral: Secondary | ICD-10-CM | POA: Diagnosis not present

## 2013-06-19 ENCOUNTER — Encounter: Payer: Self-pay | Admitting: Cardiology

## 2013-07-19 DIAGNOSIS — J3089 Other allergic rhinitis: Secondary | ICD-10-CM | POA: Diagnosis not present

## 2013-07-19 DIAGNOSIS — H1045 Other chronic allergic conjunctivitis: Secondary | ICD-10-CM | POA: Diagnosis not present

## 2013-07-19 DIAGNOSIS — J45909 Unspecified asthma, uncomplicated: Secondary | ICD-10-CM | POA: Diagnosis not present

## 2013-07-19 DIAGNOSIS — L299 Pruritus, unspecified: Secondary | ICD-10-CM | POA: Diagnosis not present

## 2013-08-28 DIAGNOSIS — Z961 Presence of intraocular lens: Secondary | ICD-10-CM | POA: Diagnosis not present

## 2013-08-28 DIAGNOSIS — D3130 Benign neoplasm of unspecified choroid: Secondary | ICD-10-CM | POA: Diagnosis not present

## 2013-08-28 DIAGNOSIS — H4320 Crystalline deposits in vitreous body, unspecified eye: Secondary | ICD-10-CM | POA: Diagnosis not present

## 2013-08-28 DIAGNOSIS — H04129 Dry eye syndrome of unspecified lacrimal gland: Secondary | ICD-10-CM | POA: Diagnosis not present

## 2013-10-22 IMAGING — CT CT ABDOMEN W/ CM
3 of 5 series · 13 of 32 positions shown, 18 images · IV contrast (30CC OMNI 300 & [ID] OMNI 300)
Comparison: None

CLINICAL DATA: IVC abnormality seen on cardiac echo.

CT ABDOMEN WITH CONTRAST
TECHNIQUE: Multidetector CT imaging of the abdomen was performed
using the standard protocol following bolus administration of
intravenous contrast.
Contrast:  100 ml [id number].

[Series 2: abd/pelvis with · axial · 0.70mm/px · z∈[-167,-17]mm · 4 of 52 slices shown, 9 images]
[im 11/52  soft-tissue]
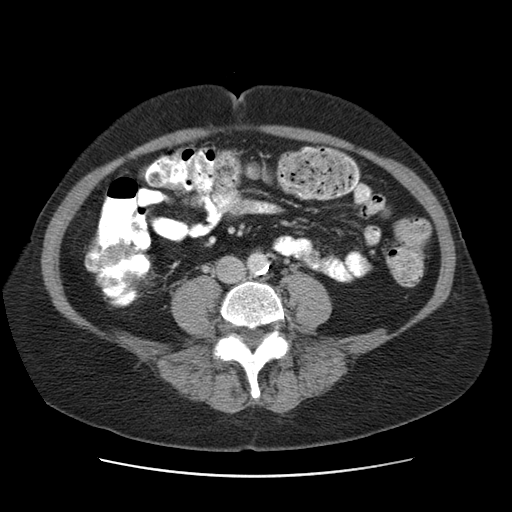
[im 11/52  lung]
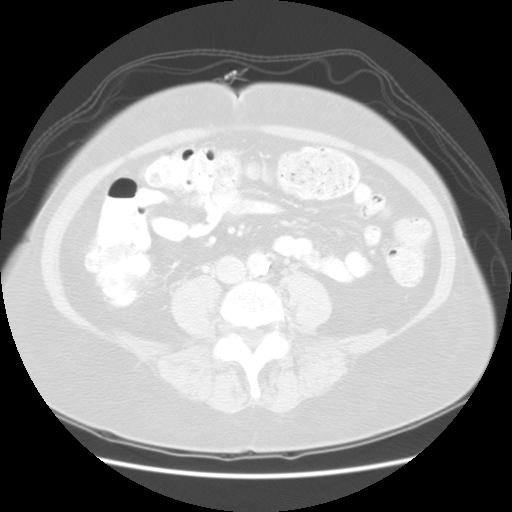
[im 11/52  bone]
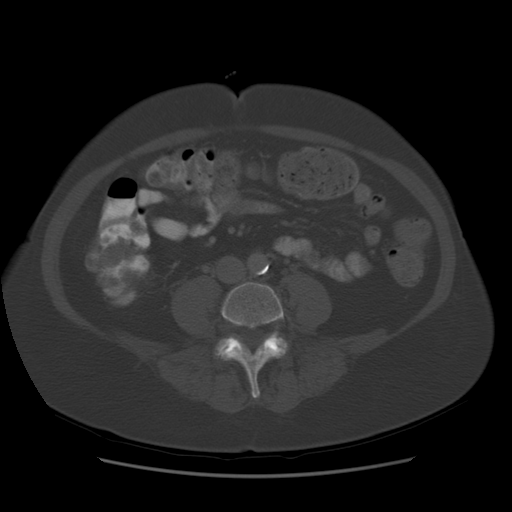
[im 21/52  soft-tissue]
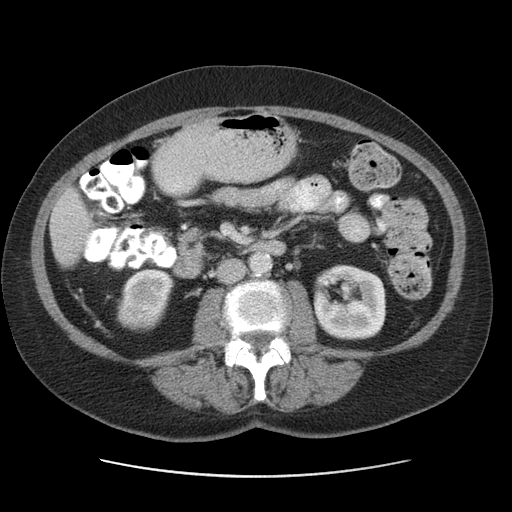
[im 21/52  lung]
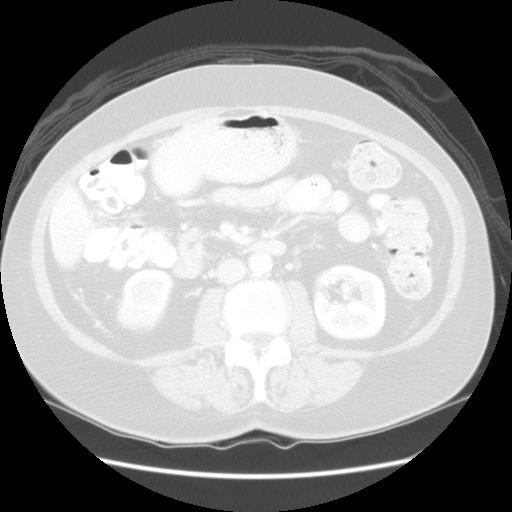
[im 31/52  soft-tissue]
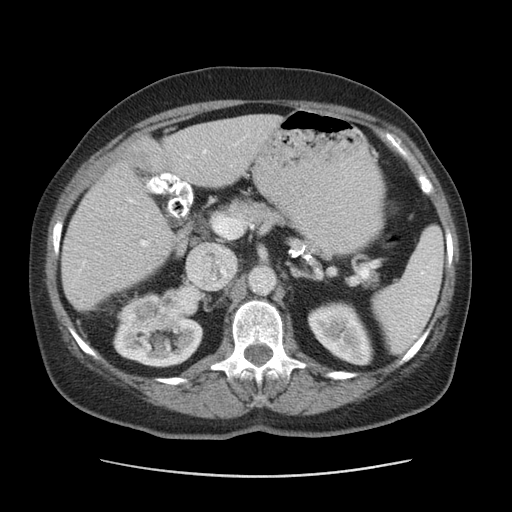
[im 31/52  lung]
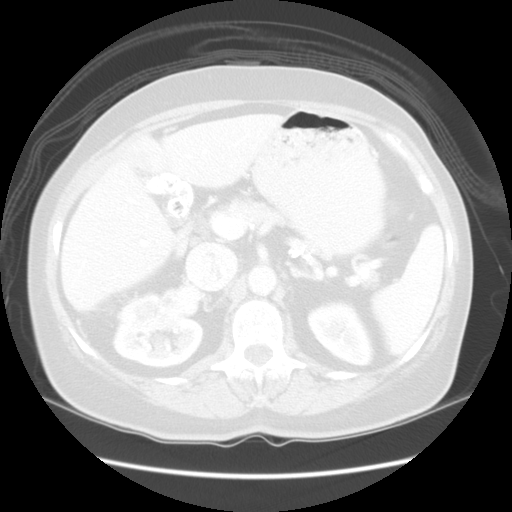
[im 41/52  soft-tissue]
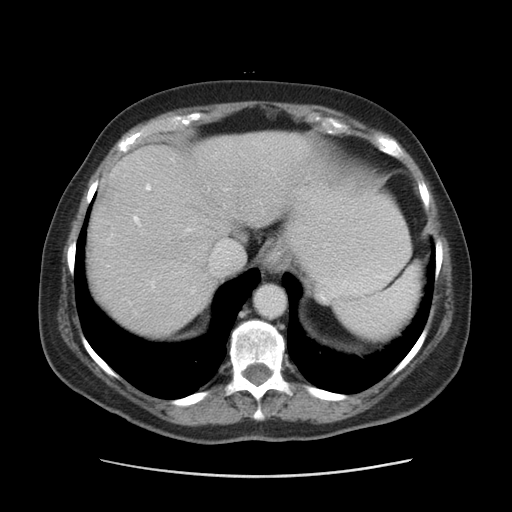
[im 41/52  lung]
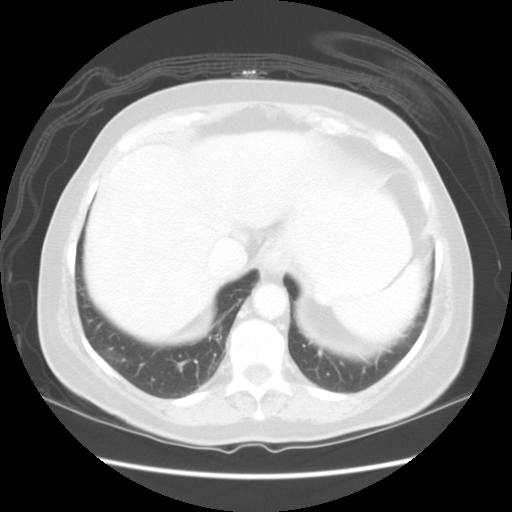

[Series 400: sag · sagittal · 0.70mm/px · 8 of 132 slices shown]
[im 12/132  soft-tissue]
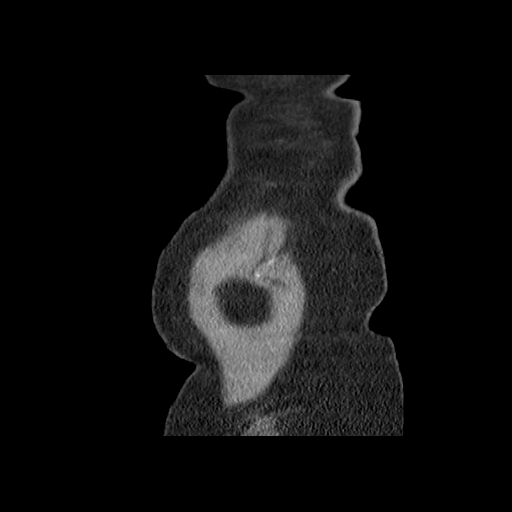
[im 24/132  soft-tissue]
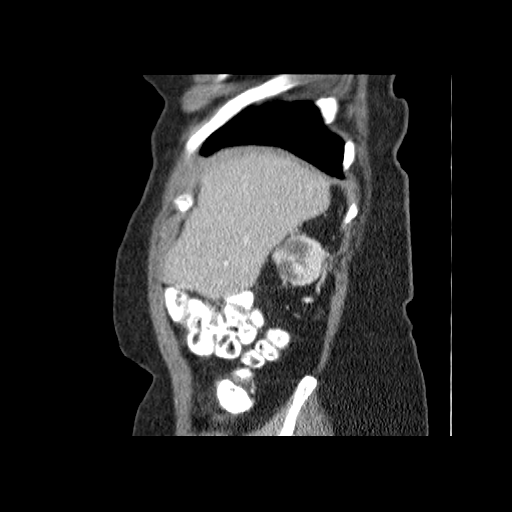
[im 48/132  soft-tissue]
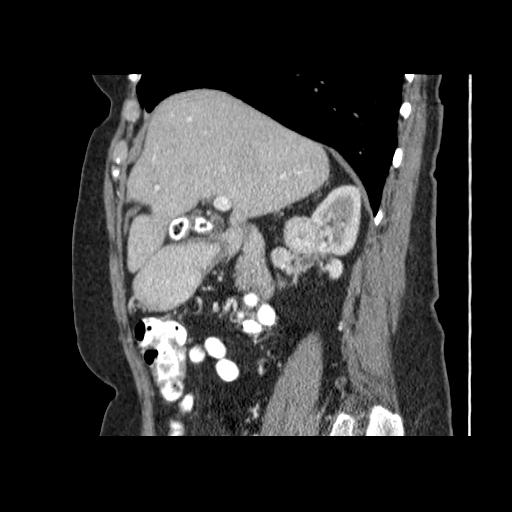
[im 60/132  soft-tissue]
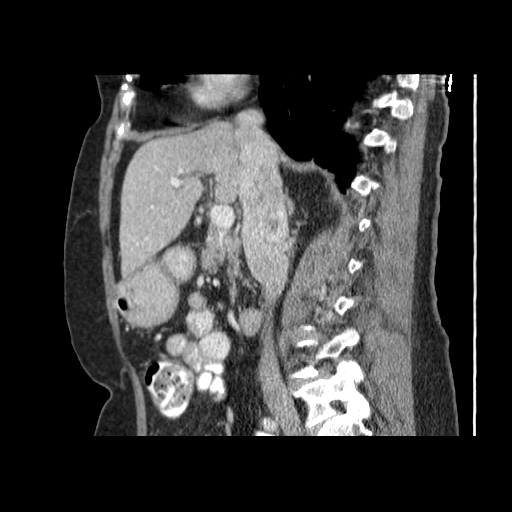
[im 72/132  soft-tissue]
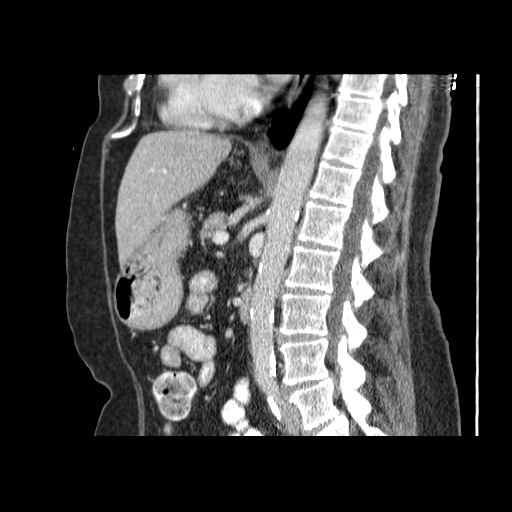
[im 84/132  soft-tissue]
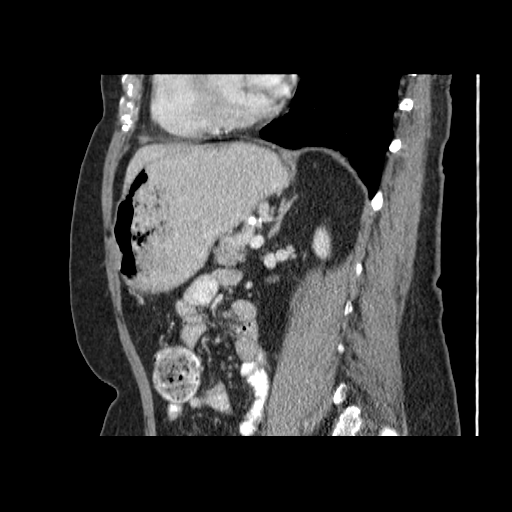
[im 108/132  soft-tissue]
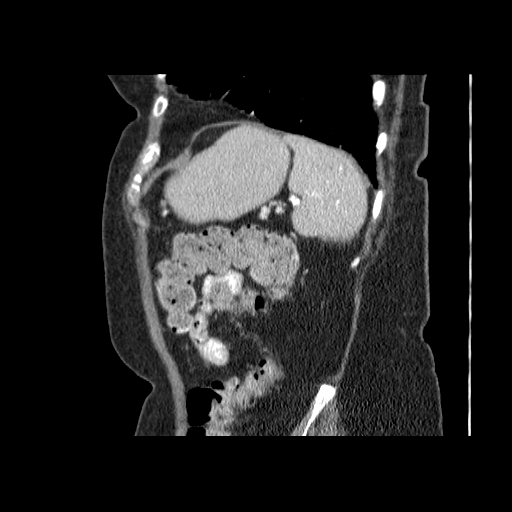
[im 120/132  soft-tissue]
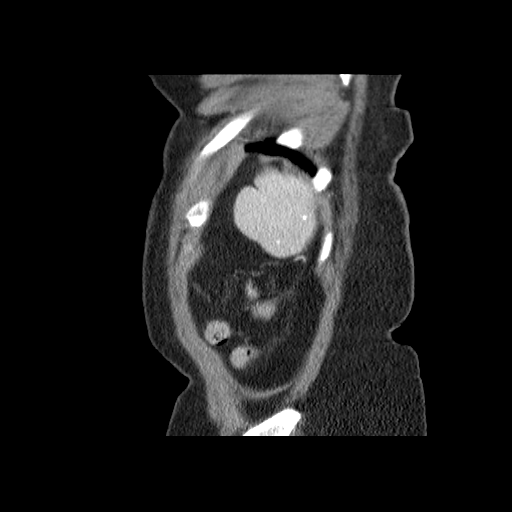

[Series 401: cor · coronal · 0.70mm/px · 1 of 108 slices shown]
[im 12/108  soft-tissue]
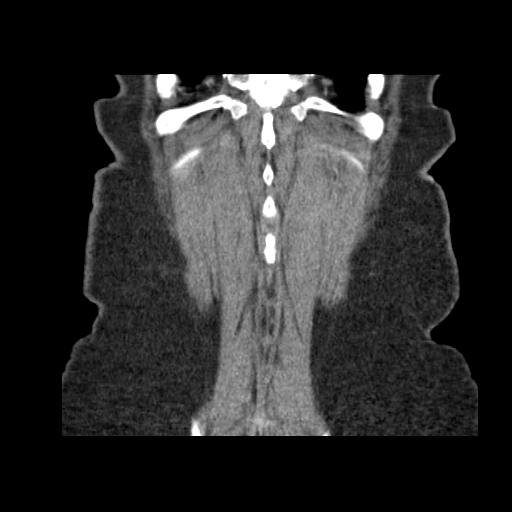

[13 of 32 positions shown; findings below may reference images not displayed]

FINDINGS: The lung bases are clear.  No pulmonary nodules or
pleural effusions.

The liver is unremarkable.  No focal lesions or intrahepatic
biliary dilatation.  The gallbladder is filled with gallstones.  No
common bile duct dilatation.  The pancreas is normal.  The spleen
is normal in size.  Numerous small calcified granulomas are noted.
The adrenal glands are normal.  The left kidney is normal.  The
right kidney demonstrates a large hypervascular mass measuring
approximately 6.1 x 4.8 cm.  This is in the mid pole region and
appears to invade the renal sinus.  It is invading the right renal
vein with tumor and thrombus extending out into the IVC this
extends up to approximately 2 cm from the right hemidiaphragm.  No
tumor or clot in the right atrium.  There are associated peri renal
collaterals.

No enlarged mesenteric or retroperitoneal lymph nodes are
identified.  The aorta is normal in caliber.  Mild to moderate
atherosclerotic changes.  There is a single right renal artery.

The stomach, duodenum, small bowel and colon are unremarkable.  The
appendix is normal.

No significant bony findings.
IMPRESSION: 1.  4 x 6 cm enhancing and slightly necrotic right renal mass which
is invading the renal sinus and the right renal vein.  There is
tumor and clot in the IVC.
2.  No mesenteric or retroperitoneal lymphadenopathy.
3.  Extensive peri renal venous collaterals.
4.  No findings for metastatic disease in the lung bases, bones or
liver.

Critical Value/emergent results were called by telephone at the
time of interpretation on 05/06/2011  at 6590 hours  to  Dr. Dukcapil
Rudi, who verbally acknowledged these results.

## 2013-11-25 DIAGNOSIS — Z803 Family history of malignant neoplasm of breast: Secondary | ICD-10-CM | POA: Diagnosis not present

## 2013-11-25 DIAGNOSIS — Z1231 Encounter for screening mammogram for malignant neoplasm of breast: Secondary | ICD-10-CM | POA: Diagnosis not present

## 2013-11-28 DIAGNOSIS — Z0000 Encounter for general adult medical examination without abnormal findings: Secondary | ICD-10-CM | POA: Diagnosis not present

## 2013-11-28 DIAGNOSIS — M949 Disorder of cartilage, unspecified: Secondary | ICD-10-CM | POA: Diagnosis not present

## 2013-11-28 DIAGNOSIS — G2581 Restless legs syndrome: Secondary | ICD-10-CM | POA: Diagnosis not present

## 2013-11-28 DIAGNOSIS — I129 Hypertensive chronic kidney disease with stage 1 through stage 4 chronic kidney disease, or unspecified chronic kidney disease: Secondary | ICD-10-CM | POA: Diagnosis not present

## 2013-11-28 DIAGNOSIS — N183 Chronic kidney disease, stage 3 unspecified: Secondary | ICD-10-CM | POA: Diagnosis not present

## 2013-11-28 DIAGNOSIS — IMO0001 Reserved for inherently not codable concepts without codable children: Secondary | ICD-10-CM | POA: Diagnosis not present

## 2013-11-28 DIAGNOSIS — Z23 Encounter for immunization: Secondary | ICD-10-CM | POA: Diagnosis not present

## 2013-11-28 DIAGNOSIS — M899 Disorder of bone, unspecified: Secondary | ICD-10-CM | POA: Diagnosis not present

## 2013-11-28 DIAGNOSIS — E785 Hyperlipidemia, unspecified: Secondary | ICD-10-CM | POA: Diagnosis not present

## 2013-11-28 DIAGNOSIS — R609 Edema, unspecified: Secondary | ICD-10-CM | POA: Diagnosis not present

## 2013-11-29 DIAGNOSIS — M899 Disorder of bone, unspecified: Secondary | ICD-10-CM | POA: Diagnosis not present

## 2013-11-29 DIAGNOSIS — Z8262 Family history of osteoporosis: Secondary | ICD-10-CM | POA: Diagnosis not present

## 2013-11-29 DIAGNOSIS — M949 Disorder of cartilage, unspecified: Secondary | ICD-10-CM | POA: Diagnosis not present

## 2013-12-19 ENCOUNTER — Encounter: Payer: Self-pay | Admitting: Cardiology

## 2013-12-19 ENCOUNTER — Ambulatory Visit (INDEPENDENT_AMBULATORY_CARE_PROVIDER_SITE_OTHER): Payer: Medicare Other | Admitting: Cardiology

## 2013-12-19 VITALS — BP 141/82 | HR 68 | Ht 62.0 in | Wt 184.0 lb

## 2013-12-19 DIAGNOSIS — G4733 Obstructive sleep apnea (adult) (pediatric): Secondary | ICD-10-CM | POA: Diagnosis not present

## 2013-12-19 DIAGNOSIS — E669 Obesity, unspecified: Secondary | ICD-10-CM | POA: Diagnosis not present

## 2013-12-19 DIAGNOSIS — I10 Essential (primary) hypertension: Secondary | ICD-10-CM | POA: Diagnosis not present

## 2013-12-19 NOTE — Patient Instructions (Signed)
Your physician recommends that you continue on your current medications as directed. Please refer to the Current Medication list given to you today.  Your physician wants you to follow-up in: 6 months with Dr Turner You will receive a reminder letter in the mail two months in advance. If you don't receive a letter, please call our office to schedule the follow-up appointment.  

## 2013-12-19 NOTE — Progress Notes (Signed)
[place], [place], [place]  [address] Phone: [phone] Fax:  [phone]  Date:  12/19/2013   ID:  Alishah, Schulte 04/30/1941, MRN [id number]  PCP:  Vidal Schwalbe, MD  Cardiologist:  Fransico Him, MD     History of Present Illness: Mackenzie King is a 73 y.o. female with a history of OSA, HTN who presents today for followup. She is using a full face mask which she tolerates well. She feels the pressure is adequate. She does not feel rested when she gets up in the am. She wakes up 3-4 times at night to urinate due to diuretics. She has no daytime sleepiness.    Wt Readings from Last 3 Encounters:  12/19/13 184 lb (83.462 kg)  05/10/13 169 lb (76.658 kg)  05/31/12 168 lb 8 oz (76.431 kg)     Past Medical History  Diagnosis Date  . Asthma   . GERD (gastroesophageal reflux disease)   . Dermatitis     intermittent Dr Shelia Media  . Plantar fasciitis     s/p shock tx Dr Paulla Dolly  . Asymptomatic gallstones   . Colon polyps     hyperplastic  . Restless legs syndrome (RLS)   . Osteopenia     mild  . Obstructive sleep apnea   . Facet arthropathy, cervical   . CKD (chronic kidney disease), stage III     Dr Florene Glen  . 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 Pure hypercholesterolemia   . Dyslipidemia   . Osteoarthritis   . Hearing loss   . Nasal congestion   . Wheezing   . Abdominal distention   . Hypertension     Current Outpatient Prescriptions  Medication Sig Dispense Refill  . albuterol (PROVENTIL HFA;VENTOLIN HFA) 108 (90 BASE) MCG/ACT inhaler Inhale 2 puffs into the lungs every 6 (six) hours as needed. For shortness of breath      . Calcium 500 MG CHEW Chew 1 tablet by mouth 3 (three) times daily.      . Calcium-Vitamin D 600-200 MG-UNIT per tablet Take by mouth.      . cetirizine (ZYRTEC) 10 MG tablet Take 10 mg by mouth at bedtime.       . Cholecalciferol (VITAMIN D) 2000 UNITS tablet Take 4,000 Units by mouth at bedtime.       . clonazePAM  (KLONOPIN) 1 MG tablet Take 1 mg by mouth at bedtime.       . Coenzyme Q10 (CO Q-10) 100 MG CAPS Take by mouth daily.      . DULoxetine (CYMBALTA) 60 MG capsule Take by mouth.      . fluticasone (FLONASE) 50 MCG/ACT nasal spray Place 2 sprays into the nose daily as needed. For allergies      . furosemide (LASIX) 20 MG tablet       . hydrOXYzine (ATARAX/VISTARIL) 25 MG tablet Take 25 mg by mouth at bedtime as needed. Take 2 tablets once per day at bedtime as needed.      Javier Docker Oil Omega-3 300 MG CAPS Take 1 tablet by mouth daily.      Marland Kitchen lisinopril (PRINIVIL,ZESTRIL) 20 MG tablet Take 20 mg by mouth at bedtime.       Marland Kitchen omeprazole (PRILOSEC) 20 MG capsule Take 40 mg by mouth daily as needed.       Marland Kitchen PATANASE 0.6 % SOLN Place 1 puff into the nose as needed.       Marland Kitchen  pravastatin (PRAVACHOL) 40 MG tablet       . SYMBICORT 160-4.5 MCG/ACT inhaler       . traMADol (ULTRAM) 50 MG tablet       . Triamcinolone Acetonide (TRIAMCINOLONE 0.1 % CREAM : EUCERIN) CREA Apply 1 application topically as needed. Apply to ankles as needed       No current facility-administered medications for this visit.    Allergies:    Allergies  Allergen Reactions  . Crestor [Rosuvastatin] Other (See Comments)    Muscle aches, constipation.  . Sulfa Antibiotics Itching and Swelling    Face only, breathing not affected.    Social History:  The patient  reports that she has never smoked. She has never used smokeless tobacco. She reports that she drinks alcohol. She reports that she does not use illicit drugs.   Family History:  The patient's family history includes Alzheimer's disease in her mother; Cancer in her mother, paternal grandfather, and sister; Colon polyps in her father; Hypertension in her mother; Stroke in her father; Transient ischemic attack in her mother.   ROS:  Please see the history of present illness.      All other systems reviewed and negative.   PHYSICAL EXAM: VS:  BP 141/82  Pulse 68  Ht 5\' 2"   (1.575 m)  Wt 184 lb (83.462 kg)  BMI 33.65 kg/m2 Well nourished, well developed, in no acute distress HEENT: normal Neck: no JVD Cardiac:  normal S1, S2; RRR; no murmur Lungs:  clear to auscultation bilaterally, no wheezing, rhonchi or rales Abd: soft, nontender, no hepatomegaly Ext: no edema Skin: warm and dry Neuro:  CNs 2-12 intact, no focal abnormalities noted  ASSESSMENT AND PLAN:  1. OSA on CPAP  - I will get a d/l from her DME 2. HTN - well controlled - continue Lisinopril        3.  Obesity - I have encouraged her to get into an exercise program  Followup with me in 6 months      Signed, Fransico Him, MD 12/19/2013 8:45 AM

## 2013-12-24 DIAGNOSIS — N390 Urinary tract infection, site not specified: Secondary | ICD-10-CM | POA: Diagnosis not present

## 2013-12-30 ENCOUNTER — Encounter: Payer: Self-pay | Admitting: General Surgery

## 2014-01-17 DIAGNOSIS — L299 Pruritus, unspecified: Secondary | ICD-10-CM | POA: Diagnosis not present

## 2014-01-17 DIAGNOSIS — J3089 Other allergic rhinitis: Secondary | ICD-10-CM | POA: Diagnosis not present

## 2014-01-17 DIAGNOSIS — H1045 Other chronic allergic conjunctivitis: Secondary | ICD-10-CM | POA: Diagnosis not present

## 2014-01-17 DIAGNOSIS — J45909 Unspecified asthma, uncomplicated: Secondary | ICD-10-CM | POA: Diagnosis not present

## 2014-03-10 DIAGNOSIS — J4531 Mild persistent asthma with (acute) exacerbation: Secondary | ICD-10-CM | POA: Diagnosis not present

## 2014-03-10 DIAGNOSIS — L299 Pruritus, unspecified: Secondary | ICD-10-CM | POA: Diagnosis not present

## 2014-03-10 DIAGNOSIS — J209 Acute bronchitis, unspecified: Secondary | ICD-10-CM | POA: Diagnosis not present

## 2014-03-10 DIAGNOSIS — H1045 Other chronic allergic conjunctivitis: Secondary | ICD-10-CM | POA: Diagnosis not present

## 2014-03-10 DIAGNOSIS — J019 Acute sinusitis, unspecified: Secondary | ICD-10-CM | POA: Diagnosis not present

## 2014-03-10 DIAGNOSIS — J3089 Other allergic rhinitis: Secondary | ICD-10-CM | POA: Diagnosis not present

## 2014-03-17 DIAGNOSIS — Z09 Encounter for follow-up examination after completed treatment for conditions other than malignant neoplasm: Secondary | ICD-10-CM | POA: Diagnosis not present

## 2014-03-17 DIAGNOSIS — K5730 Diverticulosis of large intestine without perforation or abscess without bleeding: Secondary | ICD-10-CM | POA: Diagnosis not present

## 2014-03-17 DIAGNOSIS — Z8371 Family history of colonic polyps: Secondary | ICD-10-CM | POA: Diagnosis not present

## 2014-03-17 DIAGNOSIS — Z86010 Personal history of colonic polyps: Secondary | ICD-10-CM | POA: Diagnosis not present

## 2014-03-21 DIAGNOSIS — R935 Abnormal findings on diagnostic imaging of other abdominal regions, including retroperitoneum: Secondary | ICD-10-CM | POA: Diagnosis not present

## 2014-03-21 DIAGNOSIS — Z08 Encounter for follow-up examination after completed treatment for malignant neoplasm: Secondary | ICD-10-CM | POA: Diagnosis not present

## 2014-03-21 DIAGNOSIS — C641 Malignant neoplasm of right kidney, except renal pelvis: Secondary | ICD-10-CM | POA: Diagnosis not present

## 2014-03-21 DIAGNOSIS — Z85528 Personal history of other malignant neoplasm of kidney: Secondary | ICD-10-CM | POA: Diagnosis not present

## 2014-03-21 DIAGNOSIS — E041 Nontoxic single thyroid nodule: Secondary | ICD-10-CM | POA: Diagnosis not present

## 2014-03-21 DIAGNOSIS — Z905 Acquired absence of kidney: Secondary | ICD-10-CM | POA: Diagnosis not present

## 2014-03-22 ENCOUNTER — Encounter: Payer: Self-pay | Admitting: Cardiology

## 2014-04-03 DIAGNOSIS — Z23 Encounter for immunization: Secondary | ICD-10-CM | POA: Diagnosis not present

## 2014-04-29 ENCOUNTER — Encounter: Payer: Self-pay | Admitting: Cardiology

## 2014-06-17 DIAGNOSIS — Z85528 Personal history of other malignant neoplasm of kidney: Secondary | ICD-10-CM | POA: Diagnosis not present

## 2014-06-17 DIAGNOSIS — Z905 Acquired absence of kidney: Secondary | ICD-10-CM | POA: Diagnosis not present

## 2014-06-17 DIAGNOSIS — I10 Essential (primary) hypertension: Secondary | ICD-10-CM | POA: Diagnosis not present

## 2014-06-18 NOTE — Progress Notes (Signed)
Cardiology Office Note   Date:  06/19/2014   ID:  Mackenzie, King 1940-07-04, MRN [id number]  PCP:  Vidal Schwalbe, MD  Cardiologist:   Sueanne Margarita, MD   Chief Complaint  Patient presents with  . Sleep Apnea  . Hypertension      History of Present Illness: Mackenzie King is a 74 y.o. female with a history of OSA, HTN who presents today for followup. She is using a full face mask which she tolerates well. She feels the pressure is adequate. She does not feel rested when she gets up in the am. She wakes up at night to urinate due to diuretics. She still feels tired during the day but does not nap.  She denies any nasal dryness or congestion.  She does not snore or awaken gasping for breath.  She does dream as well.       Past Medical History  Diagnosis Date  . Asthma   . GERD (gastroesophageal reflux disease)   . Dermatitis     intermittent Dr Shelia Media  . Plantar fasciitis     s/p shock tx Dr Paulla Dolly  . Asymptomatic gallstones   . Colon polyps     hyperplastic  . Restless legs syndrome (RLS)   . Osteopenia     mild  . Obstructive sleep apnea   . Facet arthropathy, cervical   . CKD (chronic kidney disease), stage III     Dr Florene Glen  . 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 Pure hypercholesterolemia   . Dyslipidemia   . Osteoarthritis   . Hearing loss   . Nasal congestion   . Wheezing   . Abdominal distention   . Hypertension     Past Surgical History  Procedure Laterality Date  . Lipoma excision    . 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 Tubal ligation  1971  . Breast surgery  1996    breast biopsy - benign calcifications  . Breast surgery  1977    adenofibroma removed - 3 biopsy's  . Eye surgery  02/2012    bilateral cataracts with lens implants  . Foot neuroma surgery      right foot  . Cholecystectomy  03/19/2012    Procedure: LAPAROSCOPIC CHOLECYSTECTOMY WITH INTRAOPERATIVE CHOLANGIOGRAM;  Surgeon:  Adin Hector, MD;  Location: [facility];  Service: General;  Laterality: N/A;  . Cholecystectomy  03/19/2012    Procedure: CHOLECYSTECTOMY;  Surgeon: Adin Hector, MD;  Location: [facility];  Service: General;  Laterality: N/A;  converted to open @ 1137     Current Outpatient Prescriptions  Medication Sig Dispense Refill  . albuterol (PROVENTIL HFA;VENTOLIN HFA) 108 (90 BASE) MCG/ACT inhaler Inhale 2 puffs into the lungs every 6 (six) hours as needed. For shortness of breath    . Calcium 500 MG CHEW Chew 1 tablet by mouth 3 (three) times daily.    . Calcium-Vitamin D 600-200 MG-UNIT per tablet Take by mouth.    . cetirizine (ZYRTEC) 10 MG tablet Take 10 mg by mouth at bedtime.     . Cholecalciferol (VITAMIN D) 2000 UNITS tablet Take 4,000 Units by mouth at bedtime.     . clonazePAM (KLONOPIN) 1 MG tablet Take 1 mg by mouth at bedtime.     . Coenzyme Q10 (CO Q-10) 100 MG CAPS Take by mouth daily.    . DULoxetine (CYMBALTA) 60 MG  capsule Take by mouth.    . fluticasone (FLONASE) 50 MCG/ACT nasal spray Place 2 sprays into the nose daily as needed. For allergies    . furosemide (LASIX) 20 MG tablet     . hydrOXYzine (ATARAX/VISTARIL) 25 MG tablet Take 25 mg by mouth at bedtime as needed. Take 2 tablets once per day at bedtime as needed.    Javier Docker Oil Omega-3 300 MG CAPS Take 1 tablet by mouth daily.    Marland Kitchen lisinopril (PRINIVIL,ZESTRIL) 20 MG tablet Take 20 mg by mouth at bedtime.     Marland Kitchen omeprazole (PRILOSEC) 20 MG capsule Take 40 mg by mouth daily as needed.     Marland Kitchen PATANASE 0.6 % SOLN Place 1 puff into the nose as needed.     . pravastatin (PRAVACHOL) 40 MG tablet     . SYMBICORT 160-4.5 MCG/ACT inhaler     . traMADol (ULTRAM) 50 MG tablet     . Triamcinolone Acetonide (TRIAMCINOLONE 0.1 % CREAM : EUCERIN) CREA Apply 1 application topically as needed. Apply to ankles as needed     No current facility-administered medications for this visit.    Allergies:   Crestor and Sulfa antibiotics     Social History:  The patient  reports that she has never smoked. She has never used smokeless tobacco. She reports that she drinks alcohol. She reports that she does not use illicit drugs.   Family History:  The patient's family history includes Alzheimer's disease in her mother; Cancer in her mother, paternal grandfather, and sister; Colon polyps in her father; Hypertension in her mother; Stroke in her father; Transient ischemic attack in her mother.    ROS:  Please see the history of present illness.   Otherwise, review of systems are positive for none.   All other systems are reviewed and negative.    PHYSICAL EXAM: VS:  BP 126/76 mmHg  Pulse 60  Ht 5\' 2"  (1.575 m)  Wt 188 lb (85.276 kg)  BMI 34.38 kg/m2 , BMI Body mass index is 34.38 kg/(m^2). GEN: Well nourished, well developed, in no acute distress HEENT: normal Neck: no JVD, carotid bruits, or masses Cardiac: RRR; no murmurs, rubs, or gallops,no edema  Respiratory:  clear to auscultation bilaterally, normal work of breathing GI: soft, nontender, nondistended, + BS MS: no deformity or atrophy Skin: warm and dry, no rash Neuro:  Strength and sensation are intact Psych: euthymic mood, full affect   EKG:  EKG was ordered today and shows NSR at 60bpm with no ST changes    Recent Labs: No results found for requested labs within last 365 days.    Lipid Panel No results found for: CHOL, TRIG, HDL, CHOLHDL, VLDL, LDLCALC, LDLDIRECT    Wt Readings from Last 3 Encounters:  06/19/14 188 lb (85.276 kg)  12/19/13 184 lb (83.462 kg)  05/10/13 169 lb (76.658 kg)      Other studies Reviewed: Additional studies/ records that were reviewed today include: CPAP download. Review of the above records demonstrates:    ASSESSMENT AND PLAN:  1. OSA on CPAP - Continue on current CPAP settings - I will get a download from the DME 2. HTN - well controlled - continue Lisinopril  - get copy of BMET from Dr. Dema Severin  3.  Obesity - I have encouraged her to get into an exercise program.  SHe has gained some weight since I saw her last.  We discussed the fact that her energy level may improve is she starts  to exercise.   Current medicines are reviewed at length with the patient today.  The patient does not have concerns regarding medicines.  The following changes have been made:  no change  Labs/ tests ordered today include: None     Disposition:   FU with me in 6 months   Signed, Sueanne Margarita, MD  06/19/2014 9:36 AM    [facility] [place], [place], [place]  [address] Phone: [phone]; Fax: [phone]

## 2014-06-19 ENCOUNTER — Ambulatory Visit (INDEPENDENT_AMBULATORY_CARE_PROVIDER_SITE_OTHER): Payer: Medicare Other | Admitting: Cardiology

## 2014-06-19 ENCOUNTER — Encounter: Payer: Self-pay | Admitting: Cardiology

## 2014-06-19 VITALS — BP 126/76 | HR 60 | Ht 62.0 in | Wt 188.0 lb

## 2014-06-19 DIAGNOSIS — G4733 Obstructive sleep apnea (adult) (pediatric): Secondary | ICD-10-CM

## 2014-06-19 DIAGNOSIS — I10 Essential (primary) hypertension: Secondary | ICD-10-CM

## 2014-06-19 DIAGNOSIS — E669 Obesity, unspecified: Secondary | ICD-10-CM

## 2014-06-19 NOTE — Patient Instructions (Addendum)
Your physician recommends that you continue on your current medications as directed. Please refer to the Current Medication list given to you today.  Your physician wants you to follow-up in: 6 months with Dr Turner You will receive a reminder letter in the mail two months in advance. If you don't receive a letter, please call our office to schedule the follow-up appointment.  

## 2014-06-26 ENCOUNTER — Encounter: Payer: Self-pay | Admitting: Cardiology

## 2014-06-26 NOTE — Telephone Encounter (Signed)
New Msg         Pt states she was informed to call office today.     Please return pt call.

## 2014-06-26 NOTE — Telephone Encounter (Signed)
This encounter was created in error - please disregard.

## 2014-07-02 DIAGNOSIS — N183 Chronic kidney disease, stage 3 (moderate): Secondary | ICD-10-CM | POA: Diagnosis not present

## 2014-07-02 DIAGNOSIS — J4520 Mild intermittent asthma, uncomplicated: Secondary | ICD-10-CM | POA: Diagnosis not present

## 2014-07-02 DIAGNOSIS — R609 Edema, unspecified: Secondary | ICD-10-CM | POA: Diagnosis not present

## 2014-07-02 DIAGNOSIS — I129 Hypertensive chronic kidney disease with stage 1 through stage 4 chronic kidney disease, or unspecified chronic kidney disease: Secondary | ICD-10-CM | POA: Diagnosis not present

## 2014-07-02 DIAGNOSIS — N393 Stress incontinence (female) (male): Secondary | ICD-10-CM | POA: Diagnosis not present

## 2014-07-02 DIAGNOSIS — R3915 Urgency of urination: Secondary | ICD-10-CM | POA: Diagnosis not present

## 2014-07-02 DIAGNOSIS — M797 Fibromyalgia: Secondary | ICD-10-CM | POA: Diagnosis not present

## 2014-07-02 DIAGNOSIS — G2581 Restless legs syndrome: Secondary | ICD-10-CM | POA: Diagnosis not present

## 2014-07-02 DIAGNOSIS — E785 Hyperlipidemia, unspecified: Secondary | ICD-10-CM | POA: Diagnosis not present

## 2014-07-02 DIAGNOSIS — F324 Major depressive disorder, single episode, in partial remission: Secondary | ICD-10-CM | POA: Diagnosis not present

## 2014-07-02 DIAGNOSIS — G4733 Obstructive sleep apnea (adult) (pediatric): Secondary | ICD-10-CM | POA: Diagnosis not present

## 2014-07-02 DIAGNOSIS — N3281 Overactive bladder: Secondary | ICD-10-CM | POA: Diagnosis not present

## 2014-07-29 DIAGNOSIS — J019 Acute sinusitis, unspecified: Secondary | ICD-10-CM | POA: Diagnosis not present

## 2014-07-29 DIAGNOSIS — H1045 Other chronic allergic conjunctivitis: Secondary | ICD-10-CM | POA: Diagnosis not present

## 2014-07-29 DIAGNOSIS — J3089 Other allergic rhinitis: Secondary | ICD-10-CM | POA: Diagnosis not present

## 2014-07-29 DIAGNOSIS — J4531 Mild persistent asthma with (acute) exacerbation: Secondary | ICD-10-CM | POA: Diagnosis not present

## 2014-08-22 DIAGNOSIS — J3089 Other allergic rhinitis: Secondary | ICD-10-CM | POA: Diagnosis not present

## 2014-08-22 DIAGNOSIS — L299 Pruritus, unspecified: Secondary | ICD-10-CM | POA: Diagnosis not present

## 2014-08-22 DIAGNOSIS — J4531 Mild persistent asthma with (acute) exacerbation: Secondary | ICD-10-CM | POA: Diagnosis not present

## 2014-08-22 DIAGNOSIS — H1045 Other chronic allergic conjunctivitis: Secondary | ICD-10-CM | POA: Diagnosis not present

## 2014-09-15 ENCOUNTER — Encounter: Payer: Self-pay | Admitting: Cardiology

## 2014-12-01 DIAGNOSIS — Z1231 Encounter for screening mammogram for malignant neoplasm of breast: Secondary | ICD-10-CM | POA: Diagnosis not present

## 2014-12-21 NOTE — Progress Notes (Signed)
Cardiology Office Note   Date:  12/22/2014   ID:  Mackenzie, King [birth year], MRN [id number]  PCP:  Vidal Schwalbe, MD    Chief Complaint  Patient presents with  . Follow-up    OSA      History of Present Illness: Mackenzie King is a 74 y.o. female with a history of OSA, HTN who presents today for followup. She is using a full face mask which she tolerates well. She feels the pressure is adequate. She does not feel rested when she gets up in the am. She wakes up at night to urinate due to diuretics about 3 times daly. She still feels tired during the day and naps about 5 times weekly for about 1 hour. She rarely has nasal congestion. She does have some problems with mouth dryness.   She does not snore or awaken gasping for breath.    Past Medical History  Diagnosis Date  . Asthma   . GERD (gastroesophageal reflux disease)   . Dermatitis     intermittent Dr Shelia Media  . Plantar fasciitis     s/p shock tx Dr Paulla Dolly  . Asymptomatic gallstones   . Colon polyps     hyperplastic  . Restless legs syndrome (RLS)   . Osteopenia     mild  . Obstructive sleep apnea   . Facet arthropathy, cervical   . CKD (chronic kidney disease), stage III     Dr Florene Glen  . 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 Pure hypercholesterolemia   . Dyslipidemia   . Osteoarthritis   . Hearing loss   . Nasal congestion   . Wheezing   . Abdominal distention   . Hypertension     Past Surgical History  Procedure Laterality Date  . Lipoma excision    . 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 Tubal ligation  1971  . Breast surgery  1996    breast biopsy - benign calcifications  . Breast surgery  1977    adenofibroma removed - 3 biopsy's  . Eye surgery  02/2012    bilateral cataracts with lens implants  . Foot neuroma surgery      right foot  . Cholecystectomy  03/19/2012    Procedure: LAPAROSCOPIC CHOLECYSTECTOMY WITH  INTRAOPERATIVE CHOLANGIOGRAM;  Surgeon: Adin Hector, MD;  Location: [facility];  Service: General;  Laterality: N/A;  . Cholecystectomy  03/19/2012    Procedure: CHOLECYSTECTOMY;  Surgeon: Adin Hector, MD;  Location: [facility];  Service: General;  Laterality: N/A;  converted to open @ 1137     Current Outpatient Prescriptions  Medication Sig Dispense Refill  . albuterol (PROVENTIL HFA;VENTOLIN HFA) 108 (90 BASE) MCG/ACT inhaler Inhale 2 puffs into the lungs every 6 (six) hours as needed. For shortness of breath    . cetirizine (ZYRTEC) 10 MG tablet Take 10 mg by mouth at bedtime.     . Cholecalciferol (VITAMIN D) 2000 UNITS tablet Take 4,000 Units by mouth at bedtime.     . clonazePAM (KLONOPIN) 1 MG tablet Take 1 mg by mouth at bedtime.     . Coenzyme Q10 (CO Q-10) 100 MG CAPS Take by mouth daily.    . fluticasone (FLONASE) 50 MCG/ACT nasal spray Place 2 sprays into the nose daily as needed. For allergies    .  furosemide (LASIX) 40 MG tablet Take 40 mg by mouth as needed for fluid or edema.    . hydrOXYzine (ATARAX/VISTARIL) 25 MG tablet Take 25 mg by mouth at bedtime as needed. Take 2 tablets once per day at bedtime as needed.    Marland Kitchen lisinopril (PRINIVIL,ZESTRIL) 20 MG tablet Take 20 mg by mouth at bedtime.     Marland Kitchen omeprazole (PRILOSEC) 20 MG capsule Take 40 mg by mouth daily as needed.     Marland Kitchen oxybutynin (DITROPAN) 5 MG tablet Take 5 mg by mouth as needed for bladder spasms.    Marland Kitchen PATANASE 0.6 % SOLN Place 1 puff into the nose as needed.     . pravastatin (PRAVACHOL) 40 MG tablet Take 40 mg by mouth daily.     . SYMBICORT 160-4.5 MCG/ACT inhaler Inhale 2 puffs into the lungs daily.     . traMADol (ULTRAM) 50 MG tablet Take 50 mg by mouth daily.     . Triamcinolone Acetonide (TRIAMCINOLONE 0.1 % CREAM : EUCERIN) CREA Apply 1 application topically as needed. Apply to ankles as needed     No current facility-administered medications for this visit.    Allergies:   Crestor; Sulfa antibiotics;  and Ciprofloxacin    Social History:  The patient  reports that she has never smoked. She has never used smokeless tobacco. She reports that she drinks alcohol. She reports that she does not use illicit drugs.   Family History:  The patient's family history includes Alzheimer's disease in her mother; Cancer in her mother, paternal grandfather, and sister; Colon polyps in her father; Hypertension in her mother; Stroke in her father; Transient ischemic attack in her mother.    ROS:  Please see the history of present illness.   Otherwise, review of systems are positive for none.   All other systems are reviewed and negative.    PHYSICAL EXAM: VS:  BP 130/68 mmHg  Pulse 73  Ht 5\' 2"  (1.575 m)  Wt 180 lb 12.8 oz (82.01 kg)  BMI 33.06 kg/m2  SpO2 97% , BMI Body mass index is 33.06 kg/(m^2). GEN: Well nourished, well developed, in no acute distress HEENT: normal Neck: no JVD, carotid bruits, or masses Cardiac: RRR; no murmurs, rubs, or gallops,no edema  Respiratory:  clear to auscultation bilaterally, normal work of breathing GI: soft, nontender, nondistended, + BS MS: no deformity or atrophy Skin: warm and dry, no rash Neuro:  Strength and sensation are intact Psych: euthymic mood, full affect   EKG:  EKG is not ordered today.    Recent Labs: No results found for requested labs within last 365 days.    Lipid Panel No results found for: CHOL, TRIG, HDL, CHOLHDL, VLDL, LDLCALC, LDLDIRECT    Wt Readings from Last 3 Encounters:  12/22/14 180 lb 12.8 oz (82.01 kg)  06/19/14 188 lb (85.276 kg)  12/19/13 184 lb (83.462 kg)    ASSESSMENT AND PLAN:  1. OSA on CPAP - her d/l today showed an AHI of 1.4/hr on 8cm H2O and 88% compliant in using more than  4 hours nightly. - Continue on current CPAP settings 2. HTN - well controlled - continue Lisinopril   3. Obesity - I have encouraged her to get into an exercise program. She has lost 8 lbst since I saw her last. We  discussed the fact that her energy level may improve is she starts to exercise    Current medicines are reviewed at length with the patient today.  The  patient does not have concerns regarding medicines.  The following changes have been made:  no change  Labs/ tests ordered today: See above Assessment and Plan No orders of the defined types were placed in this encounter.     Disposition:   FU with me in 1 year  Signed, Sueanne Margarita, MD  12/22/2014 3:38 PM    [facility] [place], [place], [place]  [address] Phone: [phone]; Fax: [phone]

## 2014-12-22 ENCOUNTER — Encounter: Payer: Self-pay | Admitting: Cardiology

## 2014-12-22 ENCOUNTER — Ambulatory Visit (INDEPENDENT_AMBULATORY_CARE_PROVIDER_SITE_OTHER): Payer: Medicare Other | Admitting: Cardiology

## 2014-12-22 VITALS — BP 130/68 | HR 73 | Ht 62.0 in | Wt 180.8 lb

## 2014-12-22 DIAGNOSIS — E669 Obesity, unspecified: Secondary | ICD-10-CM | POA: Diagnosis not present

## 2014-12-22 DIAGNOSIS — G4733 Obstructive sleep apnea (adult) (pediatric): Secondary | ICD-10-CM

## 2014-12-22 DIAGNOSIS — I10 Essential (primary) hypertension: Secondary | ICD-10-CM

## 2014-12-22 NOTE — Patient Instructions (Signed)

## 2014-12-23 ENCOUNTER — Encounter: Payer: Self-pay | Admitting: Cardiology

## 2014-12-29 ENCOUNTER — Encounter: Payer: Self-pay | Admitting: Cardiology

## 2014-12-31 DIAGNOSIS — M797 Fibromyalgia: Secondary | ICD-10-CM | POA: Diagnosis not present

## 2014-12-31 DIAGNOSIS — N3281 Overactive bladder: Secondary | ICD-10-CM | POA: Diagnosis not present

## 2014-12-31 DIAGNOSIS — Z1389 Encounter for screening for other disorder: Secondary | ICD-10-CM | POA: Diagnosis not present

## 2014-12-31 DIAGNOSIS — G2581 Restless legs syndrome: Secondary | ICD-10-CM | POA: Diagnosis not present

## 2014-12-31 DIAGNOSIS — R29818 Other symptoms and signs involving the nervous system: Secondary | ICD-10-CM | POA: Diagnosis not present

## 2014-12-31 DIAGNOSIS — L299 Pruritus, unspecified: Secondary | ICD-10-CM | POA: Diagnosis not present

## 2014-12-31 DIAGNOSIS — N183 Chronic kidney disease, stage 3 (moderate): Secondary | ICD-10-CM | POA: Diagnosis not present

## 2014-12-31 DIAGNOSIS — Z0000 Encounter for general adult medical examination without abnormal findings: Secondary | ICD-10-CM | POA: Diagnosis not present

## 2014-12-31 DIAGNOSIS — E785 Hyperlipidemia, unspecified: Secondary | ICD-10-CM | POA: Diagnosis not present

## 2014-12-31 DIAGNOSIS — I129 Hypertensive chronic kidney disease with stage 1 through stage 4 chronic kidney disease, or unspecified chronic kidney disease: Secondary | ICD-10-CM | POA: Diagnosis not present

## 2015-01-27 DIAGNOSIS — J3089 Other allergic rhinitis: Secondary | ICD-10-CM | POA: Diagnosis not present

## 2015-01-27 DIAGNOSIS — H1045 Other chronic allergic conjunctivitis: Secondary | ICD-10-CM | POA: Diagnosis not present

## 2015-01-27 DIAGNOSIS — L299 Pruritus, unspecified: Secondary | ICD-10-CM | POA: Diagnosis not present

## 2015-01-27 DIAGNOSIS — J019 Acute sinusitis, unspecified: Secondary | ICD-10-CM | POA: Diagnosis not present

## 2015-01-27 DIAGNOSIS — J209 Acute bronchitis, unspecified: Secondary | ICD-10-CM | POA: Diagnosis not present

## 2015-01-27 DIAGNOSIS — J4531 Mild persistent asthma with (acute) exacerbation: Secondary | ICD-10-CM | POA: Diagnosis not present

## 2015-02-10 DIAGNOSIS — Z23 Encounter for immunization: Secondary | ICD-10-CM | POA: Diagnosis not present

## 2015-02-27 DIAGNOSIS — J4540 Moderate persistent asthma, uncomplicated: Secondary | ICD-10-CM | POA: Diagnosis not present

## 2015-02-27 DIAGNOSIS — L299 Pruritus, unspecified: Secondary | ICD-10-CM | POA: Diagnosis not present

## 2015-02-27 DIAGNOSIS — J3089 Other allergic rhinitis: Secondary | ICD-10-CM | POA: Diagnosis not present

## 2015-02-27 DIAGNOSIS — H1045 Other chronic allergic conjunctivitis: Secondary | ICD-10-CM | POA: Diagnosis not present

## 2015-03-06 DIAGNOSIS — L03221 Cellulitis of neck: Secondary | ICD-10-CM | POA: Diagnosis not present

## 2015-03-13 DIAGNOSIS — Z6832 Body mass index (BMI) 32.0-32.9, adult: Secondary | ICD-10-CM | POA: Diagnosis not present

## 2015-03-13 DIAGNOSIS — C641 Malignant neoplasm of right kidney, except renal pelvis: Secondary | ICD-10-CM | POA: Diagnosis not present

## 2015-03-25 DIAGNOSIS — L03221 Cellulitis of neck: Secondary | ICD-10-CM | POA: Diagnosis not present

## 2015-03-25 DIAGNOSIS — L680 Hirsutism: Secondary | ICD-10-CM | POA: Diagnosis not present

## 2015-05-04 DIAGNOSIS — H43813 Vitreous degeneration, bilateral: Secondary | ICD-10-CM | POA: Diagnosis not present

## 2015-07-03 DIAGNOSIS — I129 Hypertensive chronic kidney disease with stage 1 through stage 4 chronic kidney disease, or unspecified chronic kidney disease: Secondary | ICD-10-CM | POA: Diagnosis not present

## 2015-07-03 DIAGNOSIS — N183 Chronic kidney disease, stage 3 (moderate): Secondary | ICD-10-CM | POA: Diagnosis not present

## 2015-07-03 DIAGNOSIS — E041 Nontoxic single thyroid nodule: Secondary | ICD-10-CM | POA: Diagnosis not present

## 2015-07-03 DIAGNOSIS — G2581 Restless legs syndrome: Secondary | ICD-10-CM | POA: Diagnosis not present

## 2015-07-03 DIAGNOSIS — M7061 Trochanteric bursitis, right hip: Secondary | ICD-10-CM | POA: Diagnosis not present

## 2015-07-03 DIAGNOSIS — M797 Fibromyalgia: Secondary | ICD-10-CM | POA: Diagnosis not present

## 2015-07-03 DIAGNOSIS — E785 Hyperlipidemia, unspecified: Secondary | ICD-10-CM | POA: Diagnosis not present

## 2015-07-06 ENCOUNTER — Other Ambulatory Visit: Payer: Self-pay | Admitting: Family Medicine

## 2015-07-06 DIAGNOSIS — E041 Nontoxic single thyroid nodule: Secondary | ICD-10-CM

## 2015-07-13 ENCOUNTER — Ambulatory Visit
Admission: RE | Admit: 2015-07-13 | Discharge: 2015-07-13 | Disposition: A | Discharge status: Home / Self Care | Payer: Medicare Other | Source: Ambulatory Visit | Attending: Family Medicine | Admitting: Family Medicine

## 2015-07-13 DIAGNOSIS — M7061 Trochanteric bursitis, right hip: Secondary | ICD-10-CM | POA: Diagnosis not present

## 2015-07-13 DIAGNOSIS — E042 Nontoxic multinodular goiter: Secondary | ICD-10-CM | POA: Diagnosis not present

## 2015-07-13 DIAGNOSIS — E041 Nontoxic single thyroid nodule: Secondary | ICD-10-CM

## 2015-07-15 ENCOUNTER — Other Ambulatory Visit: Payer: Self-pay | Admitting: Family Medicine

## 2015-07-15 DIAGNOSIS — E041 Nontoxic single thyroid nodule: Secondary | ICD-10-CM

## 2015-07-21 ENCOUNTER — Ambulatory Visit
Admission: RE | Admit: 2015-07-21 | Discharge: 2015-07-21 | Disposition: A | Discharge status: Home / Self Care | Payer: Medicare Other | Source: Ambulatory Visit | Attending: Family Medicine | Admitting: Family Medicine

## 2015-07-21 ENCOUNTER — Other Ambulatory Visit (HOSPITAL_COMMUNITY)
Admission: RE | Admit: 2015-07-21 | Discharge: 2015-07-21 | Disposition: A | Discharge status: Home / Self Care | Payer: Medicare Other | Source: Ambulatory Visit | Attending: Radiology | Admitting: Radiology

## 2015-07-21 DIAGNOSIS — E041 Nontoxic single thyroid nodule: Secondary | ICD-10-CM | POA: Diagnosis not present

## 2015-09-11 DIAGNOSIS — L299 Pruritus, unspecified: Secondary | ICD-10-CM | POA: Diagnosis not present

## 2015-09-11 DIAGNOSIS — J3089 Other allergic rhinitis: Secondary | ICD-10-CM | POA: Diagnosis not present

## 2015-09-11 DIAGNOSIS — J4540 Moderate persistent asthma, uncomplicated: Secondary | ICD-10-CM | POA: Diagnosis not present

## 2015-09-11 DIAGNOSIS — H1045 Other chronic allergic conjunctivitis: Secondary | ICD-10-CM | POA: Diagnosis not present

## 2015-10-02 DIAGNOSIS — D2239 Melanocytic nevi of other parts of face: Secondary | ICD-10-CM | POA: Diagnosis not present

## 2015-10-02 DIAGNOSIS — L820 Inflamed seborrheic keratosis: Secondary | ICD-10-CM | POA: Diagnosis not present

## 2015-10-08 ENCOUNTER — Ambulatory Visit (INDEPENDENT_AMBULATORY_CARE_PROVIDER_SITE_OTHER): Payer: Medicare Other | Admitting: Podiatry

## 2015-10-08 ENCOUNTER — Encounter: Payer: Self-pay | Admitting: Podiatry

## 2015-10-08 VITALS — BP 156/76 | HR 69 | Resp 16 | Ht 62.0 in | Wt 168.0 lb

## 2015-10-08 DIAGNOSIS — Q828 Other specified congenital malformations of skin: Secondary | ICD-10-CM

## 2015-10-08 NOTE — Progress Notes (Signed)
Subjective:     Patient ID: Mackenzie King, female   DOB: [birth year], 75 y.o.   MRN: [id number]  HPI patient states she has a lot of pain under her right foot and a lesion that has been very tender   Review of Systems  All other systems reviewed and are negative.      Objective:   Physical Exam  Constitutional: She is oriented to person, place, and time.  Cardiovascular: Intact distal pulses.   Musculoskeletal: Normal range of motion.  Neurological: She is oriented to person, place, and time.  Skin: Skin is warm.  Nursing note and vitals reviewed.  neurovascular status intact muscle strength adequate range of motion within normal limits with patient found to have keratotic lesion subsecond metatarsal with a lucent core that's painful when pressed good digital perfusion and well oriented 3     Assessment:     Porokeratotic type lesion second metatarsal right    Plan:     H&P condition reviewed with patient. Debridement accomplished with no iatrogenic bleeding and applied padding to the area and reappoint as needed

## 2015-10-08 NOTE — Progress Notes (Signed)
   Subjective:    Patient ID: Mackenzie King, female    DOB: 06-15-1940, 75 y.o.   MRN: [id number]  HPI Chief Complaint  Patient presents with  . Painful lesion    Right foot; plantar forefoot-below 2nd toe; x2 months      Review of Systems  HENT: Positive for hearing loss.   Eyes: Positive for itching.  Respiratory: Positive for shortness of breath and wheezing.   Endocrine: Positive for cold intolerance and polyuria.  Genitourinary: Positive for frequency.  Allergic/Immunologic: Positive for environmental allergies.  All other systems reviewed and are negative.      Objective:   Physical Exam        Assessment & Plan:

## 2015-11-20 DIAGNOSIS — N390 Urinary tract infection, site not specified: Secondary | ICD-10-CM | POA: Diagnosis not present

## 2015-11-20 DIAGNOSIS — R300 Dysuria: Secondary | ICD-10-CM | POA: Diagnosis not present

## 2015-12-03 DIAGNOSIS — Z1231 Encounter for screening mammogram for malignant neoplasm of breast: Secondary | ICD-10-CM | POA: Diagnosis not present

## 2015-12-03 DIAGNOSIS — Z803 Family history of malignant neoplasm of breast: Secondary | ICD-10-CM | POA: Diagnosis not present

## 2015-12-03 DIAGNOSIS — M8589 Other specified disorders of bone density and structure, multiple sites: Secondary | ICD-10-CM | POA: Diagnosis not present

## 2015-12-27 NOTE — Progress Notes (Signed)
Cardiology Office Note    Date:  12/28/2015   ID:  Mackenzie, King 1940-08-19, MRN [id number]  PCP:  Vidal Schwalbe, MD  Cardiologist:  Fransico Him, MD   Chief Complaint  Patient presents with  . Sleep Apnea  . Hypertension    History of Present Illness:  Mackenzie King is a 75 y.o. female with a history of OSA, HTN who presents today for followup. She is using a full face mask which she tolerates well. She feels the pressure is adequate.  She wakes up at night to urinate due to diuretics about 3 times daly. She currently has nasal congestion from a cold or allergies. She does have some problems with mouth dryness.   She does not snore or awaken gasping for breath.      Past Medical History:  Diagnosis Date  . Abdominal distention   . Asthma   . Asymptomatic gallstones   . Cancer ([place]) 12/12   renal cell (Dr Catalina Antigua)  . CKD (chronic kidney disease), stage III    Dr Florene Glen  . Colon polyps    hyperplastic  . Dermatitis    intermittent Dr Shelia Media  . Dyslipidemia   . Facet arthropathy, cervical ([place])   . GERD (gastroesophageal reflux disease)   . Hearing loss   . Hypertension   . Nasal congestion   . Obstructive sleep apnea   . Osteoarthritis   . Osteopenia    mild  . Plantar fasciitis    s/p shock tx Dr Paulla Dolly  . Pure hypercholesterolemia   . Restless legs syndrome (RLS)   . Wheezing     Past Surgical History:  Procedure Laterality Date  . BREAST SURGERY  1996   breast biopsy - benign calcifications  . BREAST SURGERY  1977   adenofibroma removed - 3 biopsy's  . CHOLECYSTECTOMY  03/19/2012   Procedure: LAPAROSCOPIC CHOLECYSTECTOMY WITH INTRAOPERATIVE CHOLANGIOGRAM;  Surgeon: Adin Hector, MD;  Location: [facility];  Service: General;  Laterality: N/A;  . CHOLECYSTECTOMY  03/19/2012   Procedure: CHOLECYSTECTOMY;  Surgeon: Adin Hector, MD;  Location: [facility];  Service: General;  Laterality: N/A;  converted to open @ 1137  . EYE SURGERY  02/2012   bilateral cataracts with lens implants  . FOOT NEUROMA SURGERY     right foot  . LIPOMA EXCISION    . 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 TUBAL LIGATION  1971    Current Medications: Outpatient Medications Prior to Visit  Medication Sig Dispense Refill  . albuterol (PROVENTIL HFA;VENTOLIN HFA) 108 (90 BASE) MCG/ACT inhaler Inhale 2 puffs into the lungs every 6 (six) hours as needed. For shortness of breath    . cetirizine (ZYRTEC) 10 MG tablet Take 10 mg by mouth at bedtime.     . Cholecalciferol (VITAMIN D) 2000 UNITS tablet Take 4,000 Units by mouth at bedtime.     . fluticasone (FLONASE) 50 MCG/ACT nasal spray Place 2 sprays into the nose daily as needed. For allergies    . furosemide (LASIX) 40 MG tablet Take 40 mg by mouth as needed for fluid or edema.    . hydrOXYzine (ATARAX/VISTARIL) 25 MG tablet Take 25 mg by mouth at bedtime as needed. Take 2 tablets once per day at bedtime as needed.    Marland Kitchen lisinopril (PRINIVIL,ZESTRIL) 20 MG tablet Take 20 mg by mouth at bedtime.     Marland Kitchen oxybutynin (DITROPAN) 5 MG  tablet Take 5 mg by mouth as needed for bladder spasms.    Marland Kitchen PATANASE 0.6 % SOLN Place 1 puff into the nose as needed.     . pravastatin (PRAVACHOL) 40 MG tablet Take 40 mg by mouth daily.     . SYMBICORT 160-4.5 MCG/ACT inhaler Inhale 2 puffs into the lungs daily.     . traMADol (ULTRAM) 50 MG tablet Take 50 mg by mouth daily.     . clonazePAM (KLONOPIN) 1 MG tablet Take 1 mg by mouth at bedtime.     . Coenzyme Q10 (CO Q-10) 100 MG CAPS Take by mouth daily.    Marland Kitchen omeprazole (PRILOSEC) 20 MG capsule Take 40 mg by mouth daily as needed.     . Triamcinolone Acetonide (TRIAMCINOLONE 0.1 % CREAM : EUCERIN) CREA Apply 1 application topically as needed. Apply to ankles as needed     No facility-administered medications prior to visit.      Allergies:   Crestor [rosuvastatin]; Sulfa antibiotics; and Ciprofloxacin   Social History   Social  History  . Marital status: Widowed    Spouse name: N/A  . Number of children: N/A  . Years of education: N/A   Social History Main Topics  . Smoking status: Never Smoker  . Smokeless tobacco: Never Used  . Alcohol use Yes     Comment: rare  . Drug use: No  . Sexual activity: Not Asked   Other Topics Concern  . None   Social History Narrative  . None     Family History:  The patient's family history includes Alzheimer's disease in her mother; Cancer in her mother, paternal grandfather, and sister; Colon polyps in her father; Hypertension in her mother; Stroke in her father; Transient ischemic attack in her mother.   ROS:   Please see the history of present illness.    Review of Systems  Neurological: Positive for loss of balance.   All other systems reviewed and are negative.   PHYSICAL EXAM:   VS:  BP 128/74   Pulse 69   Ht 5\' 2"  (1.575 m)   Wt 170 lb 9.6 oz (77.4 kg)   SpO2 97%   BMI 31.20 kg/m    GEN: Well nourished, well developed, in no acute distress  HEENT: normal  Neck: no JVD, carotid bruits, or masses Cardiac: RRR; no murmurs, rubs, or gallops,no edema.  Intact distal pulses bilaterally.  Respiratory:  clear to auscultation bilaterally, normal work of breathing GI: soft, nontender, nondistended, + BS MS: no deformity or atrophy  Skin: warm and dry, no rash Neuro:  Alert and Oriented x 3, Strength and sensation are intact Psych: euthymic mood, full affect  Wt Readings from Last 3 Encounters:  12/28/15 170 lb 9.6 oz (77.4 kg)  10/08/15 168 lb (76.2 kg)  12/22/14 180 lb 12.8 oz (82 kg)      Studies/Labs Reviewed:   EKG:  EKG is not ordered today.   Recent Labs: No results found for requested labs within last 8760 hours.   Lipid Panel No results found for: CHOL, TRIG, HDL, CHOLHDL, VLDL, LDLCALC, LDLDIRECT  Additional studies/ records that were reviewed today include:  none    ASSESSMENT:    1. Obstructive sleep apnea   2. Essential  hypertension   3. Obesity (BMI 30-39.9)      PLAN:  In order of problems listed above:  OSA - the patient is tolerating PAP therapy well without any problems. The PAP download was reviewed  today and showed an AHI of 0.5/hr on 8 cm H2O with 94% compliance in using more than 4 hours nightly.  The patient has been using and benefiting from CPAP use and will continue to benefit from therapy.  HTN - BP controlled on current medical regimen Obesity - I have encouraged him to get into a routine exercise program and cut back on carbs and portions.    Medication Adjustments/Labs and Tests Ordered: Current medicines are reviewed at length with the patient today.  Concerns regarding medicines are outlined above.  Medication changes, Labs and Tests ordered today are listed in the Patient Instructions below.  There are no Patient Instructions on file for this visit.   Signed, Fransico Him, MD  12/28/2015 8:34 AM    [facility], [place], [place]  [address] Phone: [phone]; Fax: [phone]

## 2015-12-28 ENCOUNTER — Encounter: Payer: Self-pay | Admitting: Cardiology

## 2015-12-28 ENCOUNTER — Ambulatory Visit (INDEPENDENT_AMBULATORY_CARE_PROVIDER_SITE_OTHER): Payer: Medicare Other | Admitting: Cardiology

## 2015-12-28 VITALS — BP 128/74 | HR 69 | Ht 62.0 in | Wt 170.6 lb

## 2015-12-28 DIAGNOSIS — G4733 Obstructive sleep apnea (adult) (pediatric): Secondary | ICD-10-CM

## 2015-12-28 DIAGNOSIS — E669 Obesity, unspecified: Secondary | ICD-10-CM | POA: Diagnosis not present

## 2015-12-28 DIAGNOSIS — I10 Essential (primary) hypertension: Secondary | ICD-10-CM | POA: Diagnosis not present

## 2015-12-28 NOTE — Patient Instructions (Signed)

## 2015-12-29 DIAGNOSIS — J069 Acute upper respiratory infection, unspecified: Secondary | ICD-10-CM | POA: Diagnosis not present

## 2015-12-29 DIAGNOSIS — R05 Cough: Secondary | ICD-10-CM | POA: Diagnosis not present

## 2016-01-04 ENCOUNTER — Encounter: Payer: Self-pay | Admitting: Cardiology

## 2016-01-04 DIAGNOSIS — H1045 Other chronic allergic conjunctivitis: Secondary | ICD-10-CM | POA: Diagnosis not present

## 2016-01-04 DIAGNOSIS — J3089 Other allergic rhinitis: Secondary | ICD-10-CM | POA: Diagnosis not present

## 2016-01-04 DIAGNOSIS — J4540 Moderate persistent asthma, uncomplicated: Secondary | ICD-10-CM | POA: Diagnosis not present

## 2016-01-04 DIAGNOSIS — J019 Acute sinusitis, unspecified: Secondary | ICD-10-CM | POA: Diagnosis not present

## 2016-01-15 DIAGNOSIS — I129 Hypertensive chronic kidney disease with stage 1 through stage 4 chronic kidney disease, or unspecified chronic kidney disease: Secondary | ICD-10-CM | POA: Diagnosis not present

## 2016-01-15 DIAGNOSIS — N183 Chronic kidney disease, stage 3 (moderate): Secondary | ICD-10-CM | POA: Diagnosis not present

## 2016-01-15 DIAGNOSIS — R7301 Impaired fasting glucose: Secondary | ICD-10-CM | POA: Diagnosis not present

## 2016-01-15 DIAGNOSIS — L299 Pruritus, unspecified: Secondary | ICD-10-CM | POA: Diagnosis not present

## 2016-01-15 DIAGNOSIS — N3281 Overactive bladder: Secondary | ICD-10-CM | POA: Diagnosis not present

## 2016-01-15 DIAGNOSIS — E041 Nontoxic single thyroid nodule: Secondary | ICD-10-CM | POA: Diagnosis not present

## 2016-01-15 DIAGNOSIS — Z0000 Encounter for general adult medical examination without abnormal findings: Secondary | ICD-10-CM | POA: Diagnosis not present

## 2016-01-15 DIAGNOSIS — Z23 Encounter for immunization: Secondary | ICD-10-CM | POA: Diagnosis not present

## 2016-01-15 DIAGNOSIS — R609 Edema, unspecified: Secondary | ICD-10-CM | POA: Diagnosis not present

## 2016-01-15 DIAGNOSIS — E785 Hyperlipidemia, unspecified: Secondary | ICD-10-CM | POA: Diagnosis not present

## 2016-01-15 DIAGNOSIS — G4733 Obstructive sleep apnea (adult) (pediatric): Secondary | ICD-10-CM | POA: Diagnosis not present

## 2016-02-26 DIAGNOSIS — Z905 Acquired absence of kidney: Secondary | ICD-10-CM | POA: Diagnosis not present

## 2016-02-26 DIAGNOSIS — Z08 Encounter for follow-up examination after completed treatment for malignant neoplasm: Secondary | ICD-10-CM | POA: Diagnosis not present

## 2016-02-26 DIAGNOSIS — N281 Cyst of kidney, acquired: Secondary | ICD-10-CM | POA: Diagnosis not present

## 2016-02-26 DIAGNOSIS — C649 Malignant neoplasm of unspecified kidney, except renal pelvis: Secondary | ICD-10-CM | POA: Diagnosis not present

## 2016-02-26 DIAGNOSIS — Z85528 Personal history of other malignant neoplasm of kidney: Secondary | ICD-10-CM | POA: Diagnosis not present

## 2016-02-26 DIAGNOSIS — N289 Disorder of kidney and ureter, unspecified: Secondary | ICD-10-CM | POA: Diagnosis not present

## 2016-02-26 DIAGNOSIS — Z6830 Body mass index (BMI) 30.0-30.9, adult: Secondary | ICD-10-CM | POA: Diagnosis not present

## 2016-02-26 DIAGNOSIS — J8410 Pulmonary fibrosis, unspecified: Secondary | ICD-10-CM | POA: Diagnosis not present

## 2016-02-26 DIAGNOSIS — C641 Malignant neoplasm of right kidney, except renal pelvis: Secondary | ICD-10-CM | POA: Diagnosis not present

## 2016-03-03 DIAGNOSIS — N3090 Cystitis, unspecified without hematuria: Secondary | ICD-10-CM | POA: Diagnosis not present

## 2016-03-03 DIAGNOSIS — R300 Dysuria: Secondary | ICD-10-CM | POA: Diagnosis not present

## 2016-03-28 DIAGNOSIS — N3001 Acute cystitis with hematuria: Secondary | ICD-10-CM | POA: Diagnosis not present

## 2016-03-28 DIAGNOSIS — R8290 Unspecified abnormal findings in urine: Secondary | ICD-10-CM | POA: Diagnosis not present

## 2016-04-08 DIAGNOSIS — L299 Pruritus, unspecified: Secondary | ICD-10-CM | POA: Diagnosis not present

## 2016-04-08 DIAGNOSIS — J4540 Moderate persistent asthma, uncomplicated: Secondary | ICD-10-CM | POA: Diagnosis not present

## 2016-04-08 DIAGNOSIS — H1045 Other chronic allergic conjunctivitis: Secondary | ICD-10-CM | POA: Diagnosis not present

## 2016-04-08 DIAGNOSIS — J3089 Other allergic rhinitis: Secondary | ICD-10-CM | POA: Diagnosis not present

## 2016-05-23 DIAGNOSIS — H43813 Vitreous degeneration, bilateral: Secondary | ICD-10-CM | POA: Diagnosis not present

## 2016-07-19 DIAGNOSIS — E785 Hyperlipidemia, unspecified: Secondary | ICD-10-CM | POA: Diagnosis not present

## 2016-07-19 DIAGNOSIS — M797 Fibromyalgia: Secondary | ICD-10-CM | POA: Diagnosis not present

## 2016-07-19 DIAGNOSIS — N183 Chronic kidney disease, stage 3 (moderate): Secondary | ICD-10-CM | POA: Diagnosis not present

## 2016-07-19 DIAGNOSIS — I129 Hypertensive chronic kidney disease with stage 1 through stage 4 chronic kidney disease, or unspecified chronic kidney disease: Secondary | ICD-10-CM | POA: Diagnosis not present

## 2016-07-19 DIAGNOSIS — R7303 Prediabetes: Secondary | ICD-10-CM | POA: Diagnosis not present

## 2016-07-19 DIAGNOSIS — E041 Nontoxic single thyroid nodule: Secondary | ICD-10-CM | POA: Diagnosis not present

## 2016-07-19 DIAGNOSIS — L299 Pruritus, unspecified: Secondary | ICD-10-CM | POA: Diagnosis not present

## 2016-07-21 ENCOUNTER — Other Ambulatory Visit: Payer: Self-pay | Admitting: Family Medicine

## 2016-07-21 DIAGNOSIS — E041 Nontoxic single thyroid nodule: Secondary | ICD-10-CM

## 2016-07-29 ENCOUNTER — Ambulatory Visit
Admission: RE | Admit: 2016-07-29 | Discharge: 2016-07-29 | Disposition: A | Discharge status: Home / Self Care | Payer: Medicare Other | Source: Ambulatory Visit | Attending: Family Medicine | Admitting: Family Medicine

## 2016-07-29 DIAGNOSIS — E041 Nontoxic single thyroid nodule: Secondary | ICD-10-CM

## 2016-07-29 DIAGNOSIS — E042 Nontoxic multinodular goiter: Secondary | ICD-10-CM | POA: Diagnosis not present

## 2016-08-08 DIAGNOSIS — J3089 Other allergic rhinitis: Secondary | ICD-10-CM | POA: Diagnosis not present

## 2016-08-08 DIAGNOSIS — J4540 Moderate persistent asthma, uncomplicated: Secondary | ICD-10-CM | POA: Diagnosis not present

## 2016-08-08 DIAGNOSIS — J069 Acute upper respiratory infection, unspecified: Secondary | ICD-10-CM | POA: Diagnosis not present

## 2016-08-08 DIAGNOSIS — H1045 Other chronic allergic conjunctivitis: Secondary | ICD-10-CM | POA: Diagnosis not present

## 2016-08-10 DIAGNOSIS — R300 Dysuria: Secondary | ICD-10-CM | POA: Diagnosis not present

## 2016-08-10 DIAGNOSIS — N3001 Acute cystitis with hematuria: Secondary | ICD-10-CM | POA: Diagnosis not present

## 2016-08-10 DIAGNOSIS — N898 Other specified noninflammatory disorders of vagina: Secondary | ICD-10-CM | POA: Diagnosis not present

## 2016-08-10 DIAGNOSIS — Z1635 Resistance to multiple antimicrobial drugs: Secondary | ICD-10-CM | POA: Diagnosis not present

## 2016-10-03 DIAGNOSIS — R350 Frequency of micturition: Secondary | ICD-10-CM | POA: Diagnosis not present

## 2016-10-03 DIAGNOSIS — Z85528 Personal history of other malignant neoplasm of kidney: Secondary | ICD-10-CM | POA: Diagnosis not present

## 2016-10-07 DIAGNOSIS — J3089 Other allergic rhinitis: Secondary | ICD-10-CM | POA: Diagnosis not present

## 2016-10-07 DIAGNOSIS — H1045 Other chronic allergic conjunctivitis: Secondary | ICD-10-CM | POA: Diagnosis not present

## 2016-10-07 DIAGNOSIS — J4540 Moderate persistent asthma, uncomplicated: Secondary | ICD-10-CM | POA: Diagnosis not present

## 2016-10-07 DIAGNOSIS — L299 Pruritus, unspecified: Secondary | ICD-10-CM | POA: Diagnosis not present

## 2016-12-05 DIAGNOSIS — R921 Mammographic calcification found on diagnostic imaging of breast: Secondary | ICD-10-CM | POA: Diagnosis not present

## 2016-12-05 DIAGNOSIS — Z1231 Encounter for screening mammogram for malignant neoplasm of breast: Secondary | ICD-10-CM | POA: Diagnosis not present

## 2016-12-05 DIAGNOSIS — R922 Inconclusive mammogram: Secondary | ICD-10-CM | POA: Diagnosis not present

## 2016-12-05 DIAGNOSIS — Z803 Family history of malignant neoplasm of breast: Secondary | ICD-10-CM | POA: Diagnosis not present

## 2016-12-08 DIAGNOSIS — Z803 Family history of malignant neoplasm of breast: Secondary | ICD-10-CM | POA: Diagnosis not present

## 2016-12-08 DIAGNOSIS — Z1231 Encounter for screening mammogram for malignant neoplasm of breast: Secondary | ICD-10-CM | POA: Diagnosis not present

## 2016-12-08 DIAGNOSIS — R921 Mammographic calcification found on diagnostic imaging of breast: Secondary | ICD-10-CM | POA: Diagnosis not present

## 2016-12-08 DIAGNOSIS — R922 Inconclusive mammogram: Secondary | ICD-10-CM | POA: Diagnosis not present

## 2016-12-27 NOTE — Progress Notes (Signed)
Cardiology Office Note:    Date:  12/28/2016   ID:  Mackenzie King, Mackenzie King 1940/09/30, MRN [id number]  PCP:  [name], MD  Cardiologist:  Fransico Him, MD   Referring MD: [name], MD   Chief Complaint  Patient presents with  . Sleep Apnea  . Hypertension    History of Present Illness:    Mackenzie King is a 76 y.o. female with a hx of with a history of mild OSA (AHI 6.35/hr with excessive daytime sleepiness and initial epworth sleepiness score of 15) on CPAP and  HTN.  She is here today for followup and is doing well. She continues to do well with her full face mask and  feels the pressure is adequate.  She wakes up at night to urinate due to diuretics about 3 times daily which is unchanged. She doesn't really feel rested in the am because she gets up so much.  She has no significant daytime sleepiness and does not nap unless she has not slept well the night before. She denies any significant problems with nasal congestion unless she has allergies. She does have some problems with mouth dryness but she drinks water at night and uses Biotene mouth wash. She does know if she snores or awaken gasping for breath.  Past Medical History:  Diagnosis Date  . Abdominal distention   . Asthma   . Asymptomatic gallstones   . 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 CKD (chronic kidney disease), stage III    Dr Florene Glen  . Colon polyps    hyperplastic  . Dermatitis    intermittent Dr Shelia Media  . Dyslipidemia   . Facet arthropathy, cervical ([place])   . GERD (gastroesophageal reflux disease)   . Hearing loss   . Hypertension   . Nasal congestion   . Obstructive sleep apnea   . Osteoarthritis   . Osteopenia    mild  . Plantar fasciitis    s/p shock tx Dr Paulla Dolly  . Pure hypercholesterolemia   . Restless legs syndrome (RLS)   . Wheezing     Past Surgical History:  Procedure Laterality Date  . BREAST SURGERY  1996   breast biopsy - benign calcifications  . BREAST SURGERY   1977   adenofibroma removed - 3 biopsy's  . CHOLECYSTECTOMY  03/19/2012   Procedure: LAPAROSCOPIC CHOLECYSTECTOMY WITH INTRAOPERATIVE CHOLANGIOGRAM;  Surgeon: Adin Hector, MD;  Location: [facility];  Service: General;  Laterality: N/A;  . CHOLECYSTECTOMY  03/19/2012   Procedure: CHOLECYSTECTOMY;  Surgeon: Adin Hector, MD;  Location: [facility];  Service: General;  Laterality: N/A;  converted to open @ 1137  . EYE SURGERY  02/2012   bilateral cataracts with lens implants  . FOOT NEUROMA SURGERY     right foot  . LIPOMA EXCISION    . 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 TUBAL LIGATION  1971    Current Medications: Current Meds  Medication Sig  . albuterol (PROVENTIL HFA;VENTOLIN HFA) 108 (90 BASE) MCG/ACT inhaler Inhale 2 puffs into the lungs every 6 (six) hours as needed. For shortness of breath  . CALCIUM PO Take by mouth as directed.  . cetirizine (ZYRTEC) 10 MG tablet Take 10 mg by mouth at bedtime.   . Cholecalciferol (VITAMIN D) 2000 UNITS tablet Take 4,000 Units by mouth at bedtime.   . fluticasone (FLONASE) 50 MCG/ACT nasal spray Place 2 sprays  into the nose daily as needed. For allergies  . furosemide (LASIX) 40 MG tablet Take 40 mg by mouth as needed for fluid or edema.  . hydrOXYzine (ATARAX/VISTARIL) 25 MG tablet Take 25 mg by mouth at bedtime as needed. Take 2 tablets once per day at bedtime as needed.  Marland Kitchen lisinopril (PRINIVIL,ZESTRIL) 20 MG tablet Take 40 mg by mouth at bedtime.   Marland Kitchen oxybutynin (DITROPAN) 5 MG tablet Take 5 mg by mouth as needed for bladder spasms.  . pravastatin (PRAVACHOL) 40 MG tablet Take 40 mg by mouth daily.   . SYMBICORT 160-4.5 MCG/ACT inhaler Inhale 2 puffs into the lungs daily.   . traMADol (ULTRAM) 50 MG tablet Take 50 mg by mouth daily.      Allergies:   Crestor [rosuvastatin]; Sulfa antibiotics; and Ciprofloxacin   Social History   Social History  . Marital status: Widowed    Spouse name: N/A   . Number of children: N/A  . Years of education: N/A   Social History Main Topics  . Smoking status: Never Smoker  . Smokeless tobacco: Never Used  . Alcohol use Yes     Comment: rare  . Drug use: No  . Sexual activity: Not Asked   Other Topics Concern  . None   Social History Narrative  . None     Family History: The patient's family history includes Alzheimer's disease in her mother; Cancer in her mother, paternal grandfather, and sister; Colon polyps in her father; Hypertension in her mother; Stroke in her father; Transient ischemic attack in her mother.  ROS:   Please see the history of present illness.     All other systems reviewed and are negative.  EKGs/Labs/Other Studies Reviewed:    The following studies were reviewed today: CPAP download  EKG:  EKG is not ordered today.    Recent Labs: No results found for requested labs within last 8760 hours.   Recent Lipid Panel No results found for: CHOL, TRIG, HDL, CHOLHDL, VLDL, LDLCALC, LDLDIRECT  Physical Exam:    VS:  BP 134/70   Pulse 68   Ht 5\' 2"  (1.575 m)   Wt 163 lb (73.9 kg)   SpO2 96%   BMI 29.81 kg/m     Wt Readings from Last 3 Encounters:  12/28/16 163 lb (73.9 kg)  12/28/15 170 lb 9.6 oz (77.4 kg)  10/08/15 168 lb (76.2 kg)     GEN:  Well nourished, well developed in no acute distress HEENT: Normal NECK: No JVD; No carotid bruits LYMPHATICS: No lymphadenopathy CARDIAC: RRR, no murmurs, rubs, gallops RESPIRATORY:  Clear to auscultation without rales, wheezing or rhonchi  ABDOMEN: Soft, non-tender, non-distended MUSCULOSKELETAL:  No edema; No deformity  SKIN: Warm and dry NEUROLOGIC:  Alert and oriented x 3 PSYCHIATRIC:  Normal affect   ASSESSMENT:    1. Obstructive sleep apnea   2. Essential hypertension   3. Obesity (BMI 30-39.9)    PLAN:    In order of problems listed above:  OSA - the patient is tolerating PAP therapy well without any problems. The PAP download was reviewed  today and showed an AHI of 0.6/hr on 8 cm H2O with 87% compliance in using more than 4 hours nightly.  The patient has been using and benefiting from CPAP use and will continue to benefit from therapy. I encouraged to use the Biotene mouth wash twice daily. HTN - her BP is adequately controlled on exam today.  She will continue ACE I. Obesity -  I have encouraged her to continue her walking and cut back on carbs and portions.    Medication Adjustments/Labs and Tests Ordered: Current medicines are reviewed at length with the patient today.  Concerns regarding medicines are outlined above.  No orders of the defined types were placed in this encounter.  No orders of the defined types were placed in this encounter.   Signed, Fransico Him, MD  12/28/2016 8:41 AM    [place]

## 2016-12-28 ENCOUNTER — Encounter: Payer: Self-pay | Admitting: Cardiology

## 2016-12-28 ENCOUNTER — Ambulatory Visit (INDEPENDENT_AMBULATORY_CARE_PROVIDER_SITE_OTHER): Payer: Medicare Other | Admitting: Cardiology

## 2016-12-28 VITALS — BP 134/70 | HR 68 | Ht 62.0 in | Wt 163.0 lb

## 2016-12-28 DIAGNOSIS — I10 Essential (primary) hypertension: Secondary | ICD-10-CM

## 2016-12-28 DIAGNOSIS — G4733 Obstructive sleep apnea (adult) (pediatric): Secondary | ICD-10-CM | POA: Diagnosis not present

## 2016-12-28 DIAGNOSIS — E669 Obesity, unspecified: Secondary | ICD-10-CM | POA: Diagnosis not present

## 2016-12-28 NOTE — Patient Instructions (Signed)

## 2017-02-03 DIAGNOSIS — Z0000 Encounter for general adult medical examination without abnormal findings: Secondary | ICD-10-CM | POA: Diagnosis not present

## 2017-02-03 DIAGNOSIS — J4520 Mild intermittent asthma, uncomplicated: Secondary | ICD-10-CM | POA: Diagnosis not present

## 2017-02-03 DIAGNOSIS — N183 Chronic kidney disease, stage 3 (moderate): Secondary | ICD-10-CM | POA: Diagnosis not present

## 2017-02-03 DIAGNOSIS — R7303 Prediabetes: Secondary | ICD-10-CM | POA: Diagnosis not present

## 2017-02-03 DIAGNOSIS — L299 Pruritus, unspecified: Secondary | ICD-10-CM | POA: Diagnosis not present

## 2017-02-03 DIAGNOSIS — M797 Fibromyalgia: Secondary | ICD-10-CM | POA: Diagnosis not present

## 2017-02-03 DIAGNOSIS — Z23 Encounter for immunization: Secondary | ICD-10-CM | POA: Diagnosis not present

## 2017-02-03 DIAGNOSIS — N3281 Overactive bladder: Secondary | ICD-10-CM | POA: Diagnosis not present

## 2017-02-03 DIAGNOSIS — I129 Hypertensive chronic kidney disease with stage 1 through stage 4 chronic kidney disease, or unspecified chronic kidney disease: Secondary | ICD-10-CM | POA: Diagnosis not present

## 2017-02-03 DIAGNOSIS — E785 Hyperlipidemia, unspecified: Secondary | ICD-10-CM | POA: Diagnosis not present

## 2017-02-03 DIAGNOSIS — R609 Edema, unspecified: Secondary | ICD-10-CM | POA: Diagnosis not present

## 2017-02-15 ENCOUNTER — Encounter: Payer: Self-pay | Admitting: Podiatry

## 2017-02-15 ENCOUNTER — Other Ambulatory Visit: Payer: Self-pay | Admitting: Podiatry

## 2017-02-15 ENCOUNTER — Ambulatory Visit (INDEPENDENT_AMBULATORY_CARE_PROVIDER_SITE_OTHER): Payer: Medicare Other

## 2017-02-15 ENCOUNTER — Ambulatory Visit (INDEPENDENT_AMBULATORY_CARE_PROVIDER_SITE_OTHER): Payer: Medicare Other | Admitting: Podiatry

## 2017-02-15 DIAGNOSIS — M7661 Achilles tendinitis, right leg: Secondary | ICD-10-CM

## 2017-02-15 DIAGNOSIS — M722 Plantar fascial fibromatosis: Secondary | ICD-10-CM | POA: Diagnosis not present

## 2017-02-15 DIAGNOSIS — M79671 Pain in right foot: Secondary | ICD-10-CM

## 2017-02-15 MED ORDER — TRIAMCINOLONE ACETONIDE 10 MG/ML IJ SUSP
10.0000 mg | Freq: Once | INTRAMUSCULAR | Status: AC
  Administered 2017-02-15: 10 mg

## 2017-02-15 NOTE — Patient Instructions (Addendum)
Plantar Fasciitis (Heel Spur Syndrome) with Rehab The plantar fascia is a fibrous, ligament-like, soft-tissue structure that spans the bottom of the foot. Plantar fasciitis is a condition that causes pain in the foot due to inflammation of the tissue. SYMPTOMS   Pain and tenderness on the underneath side of the foot.  Pain that worsens with standing or walking. CAUSES  Plantar fasciitis is caused by irritation and injury to the plantar fascia on the underneath side of the foot. Common mechanisms of injury include:  Direct trauma to bottom of the foot.  Damage to a small nerve that runs under the foot where the main fascia attaches to the heel bone.  Stress placed on the plantar fascia due to bone spurs. RISK INCREASES WITH:   Activities that place stress on the plantar fascia (running, jumping, pivoting, or cutting).  Poor strength and flexibility.  Improperly fitted shoes.  Tight calf muscles.  Flat feet.  Failure to warm-up properly before activity.  Obesity. PREVENTION  Warm up and stretch properly before activity.  Allow for adequate recovery between workouts.  Maintain physical fitness:  Strength, flexibility, and endurance.  Cardiovascular fitness.  Maintain a health body weight.  Avoid stress on the plantar fascia.  Wear properly fitted shoes, including arch supports for individuals who have flat feet.  PROGNOSIS  If treated properly, then the symptoms of plantar fasciitis usually resolve without surgery. However, occasionally surgery is necessary.  RELATED COMPLICATIONS   Recurrent symptoms that may result in a chronic condition.  Problems of the lower back that are caused by compensating for the injury, such as limping.  Pain or weakness of the foot during push-off following surgery.  Chronic inflammation, scarring, and partial or complete fascia tear, occurring more often from repeated injections.  TREATMENT  Treatment initially involves the  use of ice and medication to help reduce pain and inflammation. The use of strengthening and stretching exercises may help reduce pain with activity, especially stretches of the Achilles tendon. These exercises may be performed at home or with a therapist. Your caregiver may recommend that you use heel cups of arch supports to help reduce stress on the plantar fascia. Occasionally, corticosteroid injections are given to reduce inflammation. If symptoms persist for greater than 6 months despite non-surgical (conservative), then surgery may be recommended.   MEDICATION   If pain medication is necessary, then nonsteroidal anti-inflammatory medications, such as aspirin and ibuprofen, or other minor pain relievers, such as acetaminophen, are often recommended.  Do not take pain medication within 7 days before surgery.  Prescription pain relievers may be given if deemed necessary by your caregiver. Use only as directed and only as much as you need.  Corticosteroid injections may be given by your caregiver. These injections should be reserved for the most serious cases, because they may only be given a certain number of times.  HEAT AND COLD  Cold treatment (icing) relieves pain and reduces inflammation. Cold treatment should be applied for 10 to 15 minutes every 2 to 3 hours for inflammation and pain and immediately after any activity that aggravates your symptoms. Use ice packs or massage the area with a piece of ice (ice massage).  Heat treatment may be used prior to performing the stretching and strengthening activities prescribed by your caregiver, physical therapist, or athletic trainer. Use a heat pack or soak the injury in warm water.  SEEK IMMEDIATE MEDICAL CARE IF:  Treatment seems to offer no benefit, or the condition worsens.  Any medications   produce adverse side effects.  EXERCISES- RANGE OF MOTION (ROM) AND STRETCHING EXERCISES - Plantar Fasciitis (Heel Spur Syndrome) These exercises  may help you when beginning to rehabilitate your injury. Your symptoms may resolve with or without further involvement from your physician, physical therapist or athletic trainer. While completing these exercises, remember:   Restoring tissue flexibility helps normal motion to return to the joints. This allows healthier, less painful movement and activity.  An effective stretch should be held for at least 30 seconds.  A stretch should never be painful. You should only feel a gentle lengthening or release in the stretched tissue.  RANGE OF MOTION - Toe Extension, Flexion  Sit with your right / left leg crossed over your opposite knee.  Grasp your toes and gently pull them back toward the top of your foot. You should feel a stretch on the bottom of your toes and/or foot.  Hold this stretch for 10 seconds.  Now, gently pull your toes toward the bottom of your foot. You should feel a stretch on the top of your toes and or foot.  Hold this stretch for 10 seconds. Repeat  times. Complete this stretch 3 times per day.   RANGE OF MOTION - Ankle Dorsiflexion, Active Assisted  Remove shoes and sit on a chair that is preferably not on a carpeted surface.  Place right / left foot under knee. Extend your opposite leg for support.  Keeping your heel down, slide your right / left foot back toward the chair until you feel a stretch at your ankle or calf. If you do not feel a stretch, slide your bottom forward to the edge of the chair, while still keeping your heel down.  Hold this stretch for 10 seconds. Repeat 3 times. Complete this stretch 2 times per day.   STRETCH  Gastroc, Standing  Place hands on wall.  Extend right / left leg, keeping the front knee somewhat bent.  Slightly point your toes inward on your back foot.  Keeping your right / left heel on the floor and your knee straight, shift your weight toward the wall, not allowing your back to arch.  You should feel a gentle stretch  in the right / left calf. Hold this position for 10 seconds. Repeat 3 times. Complete this stretch 2 times per day.  STRETCH  Soleus, Standing  Place hands on wall.  Extend right / left leg, keeping the other knee somewhat bent.  Slightly point your toes inward on your back foot.  Keep your right / left heel on the floor, bend your back knee, and slightly shift your weight over the back leg so that you feel a gentle stretch deep in your back calf.  Hold this position for 10 seconds. Repeat 3 times. Complete this stretch 2 times per day.  STRETCH  Gastrocsoleus, Standing  Note: This exercise can place a lot of stress on your foot and ankle. Please complete this exercise only if specifically instructed by your caregiver.   Place the ball of your right / left foot on a step, keeping your other foot firmly on the same step.  Hold on to the wall or a rail for balance.  Slowly lift your other foot, allowing your body weight to press your heel down over the edge of the step.  You should feel a stretch in your right / left calf.  Hold this position for 10 seconds.  Repeat this exercise with a slight bend in your right /   left knee. Repeat 3 times. Complete this stretch 2 times per day.   STRENGTHENING EXERCISES - Plantar Fasciitis (Heel Spur Syndrome)  These exercises may help you when beginning to rehabilitate your injury. They may resolve your symptoms with or without further involvement from your physician, physical therapist or athletic trainer. While completing these exercises, remember:   Muscles can gain both the endurance and the strength needed for everyday activities through controlled exercises.  Complete these exercises as instructed by your physician, physical therapist or athletic trainer. Progress the resistance and repetitions only as guided.  STRENGTH - Towel Curls  Sit in a chair positioned on a non-carpeted surface.  Place your foot on a towel, keeping your heel  on the floor.  Pull the towel toward your heel by only curling your toes. Keep your heel on the floor. Repeat 3 times. Complete this exercise 2 times per day.  STRENGTH - Ankle Inversion  Secure one end of a rubber exercise band/tubing to a fixed object (table, pole). Loop the other end around your foot just before your toes.  Place your fists between your knees. This will focus your strengthening at your ankle.  Slowly, pull your big toe up and in, making sure the band/tubing is positioned to resist the entire motion.  Hold this position for 10 seconds.  Have your muscles resist the band/tubing as it slowly pulls your foot back to the starting position. Repeat 3 times. Complete this exercises 2 times per day.  Document Released: 05/09/2005 Document Revised: 08/01/2011 Document Reviewed: 08/21/2008 [employer] Patient Information 2014 [employer]. Achilles Tendinitis  with Rehab Achilles tendinitis is a disorder of the Achilles tendon. The Achilles tendon connects the large calf muscles (Gastrocnemius and Soleus) to the heel bone (calcaneus). This tendon is sometimes called the heel cord. It is important for pushing-off and standing on your toes and is important for walking, running, or jumping. Tendinitis is often caused by overuse and repetitive microtrauma. SYMPTOMS  Pain, tenderness, swelling, warmth, and redness may occur over the Achilles tendon even at rest.  Pain with pushing off, or flexing or extending the ankle.  Pain that is worsened after or during activity. CAUSES   Overuse sometimes seen with rapid increase in exercise programs or in sports requiring running and jumping.  Poor physical conditioning (strength and flexibility or endurance).  Running sports, especially training running down hills.  Inadequate warm-up before practice or play or failure to stretch before participation.  Injury to the tendon. PREVENTION   Warm up and stretch before practice or  competition.  Allow time for adequate rest and recovery between practices and competition.  Keep up conditioning.  Keep up ankle and leg flexibility.  Improve or keep muscle strength and endurance.  Improve cardiovascular fitness.  Use proper technique.  Use proper equipment (shoes, skates).  To help prevent recurrence, taping, protective strapping, or an adhesive bandage may be recommended for several weeks after healing is complete. PROGNOSIS   Recovery may take weeks to several months to heal.  Longer recovery is expected if symptoms have been prolonged.  Recovery is usually quicker if the inflammation is due to a direct blow as compared with overuse or sudden strain. RELATED COMPLICATIONS   Healing time will be prolonged if the condition is not correctly treated. The injury must be given plenty of time to heal.  Symptoms can reoccur if activity is resumed too soon.  Untreated, tendinitis may increase the risk of tendon rupture requiring additional time for recovery   and possibly surgery. TREATMENT   The first treatment consists of rest anti-inflammatory medication, and ice to relieve the pain.  Stretching and strengthening exercises after resolution of pain will likely help reduce the risk of recurrence. Referral to a physical therapist or athletic trainer for further evaluation and treatment may be helpful.  A walking boot or cast may be recommended to rest the Achilles tendon. This can help break the cycle of inflammation and microtrauma.  Arch supports (orthotics) may be prescribed or recommended by your caregiver as an adjunct to therapy and rest.  Surgery to remove the inflamed tendon lining or degenerated tendon tissue is rarely necessary and has shown less than predictable results. MEDICATION   Nonsteroidal anti-inflammatory medications, such as aspirin and ibuprofen, may be used for pain and inflammation relief. Do not take within 7 days before surgery. Take  these as directed by your caregiver. Contact your caregiver immediately if any bleeding, stomach upset, or signs of allergic reaction occur. Other minor pain relievers, such as acetaminophen, may also be used.  Pain relievers may be prescribed as necessary by your caregiver. Do not take prescription pain medication for longer than 4 to 7 days. Use only as directed and only as much as you need.  Cortisone injections are rarely indicated. Cortisone injections may weaken tendons and predispose to rupture. It is better to give the condition more time to heal than to use them. HEAT AND COLD  Cold is used to relieve pain and reduce inflammation for acute and chronic Achilles tendinitis. Cold should be applied for 10 to 15 minutes every 2 to 3 hours for inflammation and pain and immediately after any activity that aggravates your symptoms. Use ice packs or an ice massage.  Heat may be used before performing stretching and strengthening activities prescribed by your caregiver. Use a heat pack or a warm soak. SEEK MEDICAL CARE IF:  Symptoms get worse or do not improve in 2 weeks despite treatment.  New, unexplained symptoms develop. Drugs used in treatment may produce side effects.  EXERCISES:  RANGE OF MOTION (ROM) AND STRETCHING EXERCISES - Achilles Tendinitis  These exercises may help you when beginning to rehabilitate your injury. Your symptoms may resolve with or without further involvement from your physician, physical therapist or athletic trainer. While completing these exercises, remember:   Restoring tissue flexibility helps normal motion to return to the joints. This allows healthier, less painful movement and activity.  An effective stretch should be held for at least 30 seconds.  A stretch should never be painful. You should only feel a gentle lengthening or release in the stretched tissue.  STRETCH  Gastroc, Standing   Place hands on wall.  Extend right / left leg, keeping the  front knee somewhat bent.  Slightly point your toes inward on your back foot.  Keeping your right / left heel on the floor and your knee straight, shift your weight toward the wall, not allowing your back to arch.  You should feel a gentle stretch in the right / left calf. Hold this position for 10 seconds. Repeat 3 times. Complete this stretch 2 times per day.  STRETCH  Soleus, Standing   Place hands on wall.  Extend right / left leg, keeping the other knee somewhat bent.  Slightly point your toes inward on your back foot.  Keep your right / left heel on the floor, bend your back knee, and slightly shift your weight over the back leg so that you feel a   gentle stretch deep in your back calf.  Hold this position for 10 seconds. Repeat 3 times. Complete this stretch 2 times per day.  STRETCH  Gastrocsoleus, Standing  Note: This exercise can place a lot of stress on your foot and ankle. Please complete this exercise only if specifically instructed by your caregiver.   Place the ball of your right / left foot on a step, keeping your other foot firmly on the same step.  Hold on to the wall or a rail for balance.  Slowly lift your other foot, allowing your body weight to press your heel down over the edge of the step.  You should feel a stretch in your right / left calf.  Hold this position for 10 seconds.  Repeat this exercise with a slight bend in your knee. Repeat 3 times. Complete this stretch 2 times per day.   STRENGTHENING EXERCISES - Achilles Tendinitis These exercises may help you when beginning to rehabilitate your injury. They may resolve your symptoms with or without further involvement from your physician, physical therapist or athletic trainer. While completing these exercises, remember:   Muscles can gain both the endurance and the strength needed for everyday activities through controlled exercises.  Complete these exercises as instructed by your physician,  physical therapist or athletic trainer. Progress the resistance and repetitions only as guided.  You may experience muscle soreness or fatigue, but the pain or discomfort you are trying to eliminate should never worsen during these exercises. If this pain does worsen, stop and make certain you are following the directions exactly. If the pain is still present after adjustments, discontinue the exercise until you can discuss the trouble with your clinician.  STRENGTH - Plantar-flexors   Sit with your right / left leg extended. Holding onto both ends of a rubber exercise band/tubing, loop it around the ball of your foot. Keep a slight tension in the band.  Slowly push your toes away from you, pointing them downward.  Hold this position for 10 seconds. Return slowly, controlling the tension in the band/tubing. Repeat 3 times. Complete this exercise 2 times per day.   STRENGTH - Plantar-flexors   Stand with your feet shoulder width apart. Steady yourself with a wall or table using as little support as needed.  Keeping your weight evenly spread over the width of your feet, rise up on your toes.*  Hold this position for 10 seconds. Repeat 3 times. Complete this exercise 2 times per day.  *If this is too easy, shift your weight toward your right / left leg until you feel challenged. Ultimately, you may be asked to do this exercise with your right / left foot only.  STRENGTH  Plantar-flexors, Eccentric  Note: This exercise can place a lot of stress on your foot and ankle. Please complete this exercise only if specifically instructed by your caregiver.   Place the balls of your feet on a step. With your hands, use only enough support from a wall or rail to keep your balance.  Keep your knees straight and rise up on your toes.  Slowly shift your weight entirely to your right / left toes and pick up your opposite foot. Gently and with controlled movement, lower your weight through your right /  left foot so that your heel drops below the level of the step. You will feel a slight stretch in the back of your calf at the end position.  Use the healthy leg to help rise up onto   the balls of both feet, then lower weight only on the right / left leg again. Build up to 15 repetitions. Then progress to 3 consecutive sets of 15 repetitions.*  After completing the above exercise, complete the same exercise with a slight knee bend (about 30 degrees). Again, build up to 15 repetitions. Then progress to 3 consecutive sets of 15 repetitions.* Perform this exercise 2 times per day.  *When you easily complete 3 sets of 15, your physician, physical therapist or athletic trainer may advise you to add resistance by wearing a backpack filled with additional weight.  STRENGTH - Plantar Flexors, Seated   Sit on a chair that allows your feet to rest flat on the ground. If necessary, sit at the edge of the chair.  Keeping your toes firmly on the ground, lift your right / left heel as far as you can without increasing any discomfort in your ankle. Repeat 3 times. Complete this exercise 2 times a day.  

## 2017-02-15 NOTE — Progress Notes (Signed)
Subjective:    Patient ID: Mackenzie King, female   DOB: 76 y.o.   MRN: [id number]   HPI patient presents with 2 separate problems with pain on both the back and bottom of the heel and states that it's been getting gradually worse over the last month. Patient does not smoke and likes to be active    Review of Systems  All other systems reviewed and are negative.       Objective:  Physical Exam  Constitutional: She appears well-developed and well-nourished.  Cardiovascular: Intact distal pulses.   Pulmonary/Chest: Effort normal.  Musculoskeletal: Normal range of motion.  Neurological: She is alert.  Skin: Skin is warm.  Nursing note and vitals reviewed.  neurovascular status intact muscle strength was adequate range of motion was found to be within normal limits with mild equinus condition noted. Patient does have quite a bit of discomfort plantar aspect right heel insertional point tendon into the calcaneus with mild discomfort in the posterior heel medial side localized with no increased edema or erythema.     Assessment:  Probability for acute plantar fasciitis with possibility for minor Achilles tendinitis which may be compensatory secondary to plantar pain       Plan:    H&P condition reviewed and today were focusing on the plantar heel and I injected the fascia 3 mg Kenalog 5 mg Xylocaine and then gave instructions for physical therapy anti-inflammatories and dispensed fascial brace with instructions on usage. Reappoint to recheck again in 2 weeks and may require treatment for the posterior heel  X-rays indicate small spur with no indication stress fracture advanced arthritis

## 2017-02-27 ENCOUNTER — Other Ambulatory Visit: Payer: Self-pay | Admitting: Urology

## 2017-02-27 ENCOUNTER — Ambulatory Visit (HOSPITAL_COMMUNITY)
Admission: RE | Admit: 2017-02-27 | Discharge: 2017-02-27 | Disposition: A | Discharge status: Home / Self Care | Payer: Medicare Other | Source: Ambulatory Visit | Attending: Urology | Admitting: Urology

## 2017-02-27 DIAGNOSIS — Z85528 Personal history of other malignant neoplasm of kidney: Secondary | ICD-10-CM

## 2017-02-27 DIAGNOSIS — R918 Other nonspecific abnormal finding of lung field: Secondary | ICD-10-CM | POA: Diagnosis not present

## 2017-02-27 DIAGNOSIS — C649 Malignant neoplasm of unspecified kidney, except renal pelvis: Secondary | ICD-10-CM | POA: Diagnosis not present

## 2017-02-27 DIAGNOSIS — R109 Unspecified abdominal pain: Secondary | ICD-10-CM | POA: Diagnosis not present

## 2017-03-01 ENCOUNTER — Ambulatory Visit (INDEPENDENT_AMBULATORY_CARE_PROVIDER_SITE_OTHER): Payer: Medicare Other | Admitting: Podiatry

## 2017-03-01 DIAGNOSIS — M722 Plantar fascial fibromatosis: Secondary | ICD-10-CM | POA: Diagnosis not present

## 2017-03-01 MED ORDER — TRIAMCINOLONE ACETONIDE 10 MG/ML IJ SUSP
10.0000 mg | Freq: Once | INTRAMUSCULAR | Status: AC
  Administered 2017-03-01: 10 mg

## 2017-03-01 NOTE — Progress Notes (Signed)
Subjective:    Patient ID: Mackenzie King, female   DOB: 76 y.o.   MRN: [id number]   HPI patient states her heel has improved but is still painful    ROS      Objective:  Physical Exam neurovascular status intact with pain in the right plantar heel which is improved but it is still sore with palpation     Assessment:    Plantar fasciitis right inflammation fluid still noted but improved     Plan:    Advised on physical therapy and did a small proximal injection 3 mg Kenalog 5 mg Xylocaine which was tolerated well and advised on heat ice therapy

## 2017-03-06 DIAGNOSIS — Z85528 Personal history of other malignant neoplasm of kidney: Secondary | ICD-10-CM | POA: Diagnosis not present

## 2017-03-06 DIAGNOSIS — R350 Frequency of micturition: Secondary | ICD-10-CM | POA: Diagnosis not present

## 2017-03-25 DIAGNOSIS — N390 Urinary tract infection, site not specified: Secondary | ICD-10-CM | POA: Diagnosis not present

## 2017-04-20 DIAGNOSIS — R300 Dysuria: Secondary | ICD-10-CM | POA: Diagnosis not present

## 2017-04-20 DIAGNOSIS — N3001 Acute cystitis with hematuria: Secondary | ICD-10-CM | POA: Diagnosis not present

## 2017-05-29 DIAGNOSIS — J4540 Moderate persistent asthma, uncomplicated: Secondary | ICD-10-CM | POA: Diagnosis not present

## 2017-05-29 DIAGNOSIS — L299 Pruritus, unspecified: Secondary | ICD-10-CM | POA: Diagnosis not present

## 2017-05-29 DIAGNOSIS — J3089 Other allergic rhinitis: Secondary | ICD-10-CM | POA: Diagnosis not present

## 2017-05-29 DIAGNOSIS — H1045 Other chronic allergic conjunctivitis: Secondary | ICD-10-CM | POA: Diagnosis not present

## 2017-06-01 DIAGNOSIS — R921 Mammographic calcification found on diagnostic imaging of breast: Secondary | ICD-10-CM | POA: Diagnosis not present

## 2017-06-01 DIAGNOSIS — H524 Presbyopia: Secondary | ICD-10-CM | POA: Diagnosis not present

## 2017-06-01 DIAGNOSIS — H2513 Age-related nuclear cataract, bilateral: Secondary | ICD-10-CM | POA: Diagnosis not present

## 2017-08-04 DIAGNOSIS — N183 Chronic kidney disease, stage 3 (moderate): Secondary | ICD-10-CM | POA: Diagnosis not present

## 2017-08-04 DIAGNOSIS — M797 Fibromyalgia: Secondary | ICD-10-CM | POA: Diagnosis not present

## 2017-08-04 DIAGNOSIS — R609 Edema, unspecified: Secondary | ICD-10-CM | POA: Diagnosis not present

## 2017-08-04 DIAGNOSIS — N3281 Overactive bladder: Secondary | ICD-10-CM | POA: Diagnosis not present

## 2017-08-04 DIAGNOSIS — R7303 Prediabetes: Secondary | ICD-10-CM | POA: Diagnosis not present

## 2017-08-04 DIAGNOSIS — K219 Gastro-esophageal reflux disease without esophagitis: Secondary | ICD-10-CM | POA: Diagnosis not present

## 2017-08-04 DIAGNOSIS — M7661 Achilles tendinitis, right leg: Secondary | ICD-10-CM | POA: Diagnosis not present

## 2017-08-04 DIAGNOSIS — E785 Hyperlipidemia, unspecified: Secondary | ICD-10-CM | POA: Diagnosis not present

## 2017-08-04 DIAGNOSIS — I129 Hypertensive chronic kidney disease with stage 1 through stage 4 chronic kidney disease, or unspecified chronic kidney disease: Secondary | ICD-10-CM | POA: Diagnosis not present

## 2017-12-07 DIAGNOSIS — R922 Inconclusive mammogram: Secondary | ICD-10-CM | POA: Diagnosis not present

## 2017-12-07 DIAGNOSIS — M8589 Other specified disorders of bone density and structure, multiple sites: Secondary | ICD-10-CM | POA: Diagnosis not present

## 2017-12-07 DIAGNOSIS — Z780 Asymptomatic menopausal state: Secondary | ICD-10-CM | POA: Diagnosis not present

## 2018-01-01 ENCOUNTER — Ambulatory Visit (INDEPENDENT_AMBULATORY_CARE_PROVIDER_SITE_OTHER): Payer: Medicare Other | Admitting: Cardiology

## 2018-01-01 ENCOUNTER — Encounter (INDEPENDENT_AMBULATORY_CARE_PROVIDER_SITE_OTHER): Payer: Self-pay

## 2018-01-01 ENCOUNTER — Encounter: Payer: Self-pay | Admitting: Cardiology

## 2018-01-01 VITALS — BP 134/66 | HR 70 | Ht 62.0 in | Wt 167.0 lb

## 2018-01-01 DIAGNOSIS — E669 Obesity, unspecified: Secondary | ICD-10-CM

## 2018-01-01 DIAGNOSIS — G4733 Obstructive sleep apnea (adult) (pediatric): Secondary | ICD-10-CM | POA: Diagnosis not present

## 2018-01-01 DIAGNOSIS — I10 Essential (primary) hypertension: Secondary | ICD-10-CM

## 2018-01-01 NOTE — Patient Instructions (Addendum)
Medication Instructions:  Your physician recommends that you continue on your current medications as directed. Please refer to the Current Medication list given to you today.  Follow-Up: You are scheduled with Dr. Radford Pax, on 11/4 at 1 pm   Any Other Special Instructions Will Be Listed Below (If Applicable).  [facility] will contact you about your new CPAP.   If you need a refill on your cardiac medications before your next appointment, please call your pharmacy.

## 2018-01-01 NOTE — Progress Notes (Signed)
Cardiology Office Note:    Date:  01/01/2018   ID:  Mackenzie, King 03-Mar-1941, MRN [id number]  PCP:  Mackenzie Stains, MD  Cardiologist:  No primary care provider on file.    Referring MD: Mackenzie Stains, MD   Chief Complaint  Patient presents with  . Sleep Apnea  . Hypertension    History of Present Illness:    Mackenzie King is a 77 y.o. female with a hx of mild OSA (AHI 6.35/hr with excessive daytime sleepiness and initial epworth sleepiness score of 15) on CPAP and  HTN. She is doing well with her CPAP device.  She tolerates the mask and feels the pressure is adequate.  Since going on CPAP she feels rested in the am and has no significant daytime sleepiness.  She denies any significant mouth or nasal dryness or nasal congestion.  She does not think that he snores.     Past Medical History:  Diagnosis Date  . Abdominal distention   . Asthma   . Asymptomatic gallstones   . 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 CKD (chronic kidney disease), stage III (HCC)    Mackenzie King  . Colon polyps    hyperplastic  . Dermatitis    intermittent Mackenzie King  . Dyslipidemia   . Facet arthropathy, cervical   . GERD (gastroesophageal reflux disease)   . Hearing loss   . Hypertension   . Nasal congestion   . Obstructive sleep apnea   . Osteoarthritis   . Osteopenia    mild  . Plantar fasciitis    s/p shock tx Mackenzie King  . Pure hypercholesterolemia   . Restless legs syndrome (RLS)   . Wheezing     Past Surgical History:  Procedure Laterality Date  . BREAST SURGERY  1996   breast biopsy - benign calcifications  . BREAST SURGERY  1977   adenofibroma removed - 3 biopsy's  . CHOLECYSTECTOMY  03/19/2012   Procedure: LAPAROSCOPIC CHOLECYSTECTOMY WITH INTRAOPERATIVE CHOLANGIOGRAM;  Surgeon: Mackenzie Hector, MD;  Location: [facility];  Service: General;  Laterality: N/A;  . CHOLECYSTECTOMY  03/19/2012   Procedure: CHOLECYSTECTOMY;  Surgeon: Mackenzie Hector, MD;  Location: [facility]  [facility];  Service: General;  Laterality: N/A;  converted to open @ 1137  . EYE SURGERY  02/2012   bilateral cataracts with lens implants  . FOOT NEUROMA SURGERY     right foot  . LIPOMA EXCISION    . 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 TUBAL LIGATION  1971    Current Medications: Current Meds  Medication Sig  . albuterol (PROVENTIL HFA;VENTOLIN HFA) 108 (90 BASE) MCG/ACT inhaler Inhale 2 puffs into the lungs every 6 (six) hours as needed. For shortness of breath  . CALCIUM PO Take by mouth as directed.  . cetirizine (ZYRTEC) 10 MG tablet Take 10 mg by mouth at bedtime.   . Cholecalciferol (VITAMIN D) 2000 UNITS tablet Take 4,000 Units by mouth at bedtime.   . fluticasone (FLONASE) 50 MCG/ACT nasal spray Place 2 sprays into the nose daily as needed. For allergies  . furosemide (LASIX) 40 MG tablet Take 40 mg by mouth as needed for fluid or edema.  . hydrOXYzine (ATARAX/VISTARIL) 25 MG tablet Take 25 mg by mouth at bedtime as needed. Take 2 tablets once per day at bedtime as needed.  Marland Kitchen oxybutynin (DITROPAN) 5 MG tablet Take 5 mg by  mouth as needed for bladder spasms.  . pravastatin (PRAVACHOL) 40 MG tablet Take 40 mg by mouth daily.   . SYMBICORT 160-4.5 MCG/ACT inhaler Inhale 2 puffs into the lungs daily.   . [DISCONTINUED] lisinopril (PRINIVIL,ZESTRIL) 20 MG tablet Take 40 mg by mouth at bedtime.      Allergies:   Crestor [rosuvastatin]; Sulfa antibiotics; and Ciprofloxacin   Social History   Socioeconomic History  . Marital status: Widowed    Spouse name: Not on file  . Number of children: Not on file  . Years of education: Not on file  . Highest education level: Not on file  Occupational History  . Not on file  Social Needs  . Financial resource strain: Not on file  . Food insecurity:    Worry: Not on file    Inability: Not on file  . Transportation needs:    Medical: Not on file    Non-medical: Not on file  Tobacco Use  .  Smoking status: Never Smoker  . Smokeless tobacco: Never Used  Substance and Sexual Activity  . Alcohol use: Yes    Comment: rare  . Drug use: No  . Sexual activity: Not on file  Lifestyle  . Physical activity:    Days per week: Not on file    Minutes per session: Not on file  . Stress: Not on file  Relationships  . Social connections:    Talks on phone: Not on file    Gets together: Not on file    Attends religious service: Not on file    Active member of club or organization: Not on file    Attends meetings of clubs or organizations: Not on file    Relationship status: Not on file  Other Topics Concern  . Not on file  Social History Narrative  . Not on file     Family History: The patient's family history includes Alzheimer's disease in her mother; Cancer in her mother, paternal grandfather, and sister; Colon polyps in her father; Hypertension in her mother; Stroke in her father; Transient ischemic attack in her mother.  ROS:   Please see the history of present illness.    ROS  All other systems reviewed and negative.   EKGs/Labs/Other Studies Reviewed:    The following studies were reviewed today: PAP download  EKG:  EKG is not ordered today.    Recent Labs: No results found for requested labs within last 8760 hours.   Recent Lipid Panel No results found for: CHOL, TRIG, HDL, CHOLHDL, VLDL, LDLCALC, LDLDIRECT  Physical Exam:    VS:  BP 134/66   Pulse 70   Ht 5\' 2"  (1.575 m)   Wt 167 lb (75.8 kg)   SpO2 98%   BMI 30.54 kg/m     Wt Readings from Last 3 Encounters:  01/01/18 167 lb (75.8 kg)  12/28/16 163 lb (73.9 kg)  12/28/15 170 lb 9.6 oz (77.4 kg)     GEN:  Well nourished, well developed in no acute distress HEENT: Normal NECK: No JVD; No carotid bruits LYMPHATICS: No lymphadenopathy CARDIAC: RRR, no murmurs, rubs, gallops RESPIRATORY:  Clear to auscultation without rales, wheezing or rhonchi  ABDOMEN: Soft, non-tender,  non-distended MUSCULOSKELETAL:  No edema; No deformity  SKIN: Warm and dry NEUROLOGIC:  Alert and oriented x 3 PSYCHIATRIC:  Normal affect   ASSESSMENT:    1. Obstructive sleep apnea   2. Essential hypertension   3. Obesity (BMI 30-39.9)    PLAN:  In order of problems listed above:  1.  OSA - the patient is tolerating PAP therapy well without any problems. The PAP download was reviewed today and showed an AHI of 0.6/hr on 8 cm H2O with 87% compliance in using more than 4 hours nightly.  The patient has been using and benefiting from PAP use and will continue to benefit from therapy.  She would like to have a new device as hers is 77 years old.  I am going to order her a ResMed CPAP at 8 cm H2O with heated humidity.  I will see her back in 10 weeks to document compliance per insurance requirements.  2.  HTN - BP is well controlled on exam today.  She will continue on lisinopril 40 mg daily.  It is stable at 1.121 08/04/2017  3.  Obesity - I have encouraged her to get into a routine exercise program and cut back on carbs and portions.    Medication Adjustments/Labs and Tests Ordered: Current medicines are reviewed at length with the patient today.  Concerns regarding medicines are outlined above.  No orders of the defined types were placed in this encounter.  No orders of the defined types were placed in this encounter.   Signed, Fransico Him, MD  01/01/2018 2:24 PM    [place]

## 2018-01-02 ENCOUNTER — Telehealth: Payer: Self-pay | Admitting: *Deleted

## 2018-01-02 NOTE — Telephone Encounter (Signed)
-----   Message from Theodoro Parma, RN sent at 01/01/2018  2:39 PM EDT ----- Regarding: DME order DME order placed. Please send to [facility].  Follow-up appointment scheduled 11/4.  Thanks!  Valetta Fuller

## 2018-01-02 NOTE — Telephone Encounter (Signed)
Order faxed to [facility]. 

## 2018-01-16 DIAGNOSIS — H4322 Crystalline deposits in vitreous body, left eye: Secondary | ICD-10-CM | POA: Diagnosis not present

## 2018-01-16 DIAGNOSIS — H43811 Vitreous degeneration, right eye: Secondary | ICD-10-CM | POA: Diagnosis not present

## 2018-01-16 DIAGNOSIS — Z961 Presence of intraocular lens: Secondary | ICD-10-CM | POA: Diagnosis not present

## 2018-01-16 DIAGNOSIS — H35439 Paving stone degeneration of retina, unspecified eye: Secondary | ICD-10-CM | POA: Diagnosis not present

## 2018-01-23 DIAGNOSIS — H01003 Unspecified blepharitis right eye, unspecified eyelid: Secondary | ICD-10-CM | POA: Diagnosis not present

## 2018-01-30 NOTE — Telephone Encounter (Signed)
Patient has a 10 week follow up appointment scheduled for 11/4 2019. Patient understands she needs to keep this appointment for insurance compliance.

## 2018-03-07 DIAGNOSIS — N3281 Overactive bladder: Secondary | ICD-10-CM | POA: Diagnosis not present

## 2018-03-07 DIAGNOSIS — Z23 Encounter for immunization: Secondary | ICD-10-CM | POA: Diagnosis not present

## 2018-03-07 DIAGNOSIS — E041 Nontoxic single thyroid nodule: Secondary | ICD-10-CM | POA: Diagnosis not present

## 2018-03-07 DIAGNOSIS — K219 Gastro-esophageal reflux disease without esophagitis: Secondary | ICD-10-CM | POA: Diagnosis not present

## 2018-03-07 DIAGNOSIS — I129 Hypertensive chronic kidney disease with stage 1 through stage 4 chronic kidney disease, or unspecified chronic kidney disease: Secondary | ICD-10-CM | POA: Diagnosis not present

## 2018-03-07 DIAGNOSIS — J4520 Mild intermittent asthma, uncomplicated: Secondary | ICD-10-CM | POA: Diagnosis not present

## 2018-03-07 DIAGNOSIS — F325 Major depressive disorder, single episode, in full remission: Secondary | ICD-10-CM | POA: Diagnosis not present

## 2018-03-07 DIAGNOSIS — L299 Pruritus, unspecified: Secondary | ICD-10-CM | POA: Diagnosis not present

## 2018-03-07 DIAGNOSIS — N183 Chronic kidney disease, stage 3 (moderate): Secondary | ICD-10-CM | POA: Diagnosis not present

## 2018-03-07 DIAGNOSIS — R7303 Prediabetes: Secondary | ICD-10-CM | POA: Diagnosis not present

## 2018-03-07 DIAGNOSIS — E785 Hyperlipidemia, unspecified: Secondary | ICD-10-CM | POA: Diagnosis not present

## 2018-03-07 DIAGNOSIS — Z0000 Encounter for general adult medical examination without abnormal findings: Secondary | ICD-10-CM | POA: Diagnosis not present

## 2018-03-12 DIAGNOSIS — Z85528 Personal history of other malignant neoplasm of kidney: Secondary | ICD-10-CM | POA: Diagnosis not present

## 2018-03-12 DIAGNOSIS — R350 Frequency of micturition: Secondary | ICD-10-CM | POA: Diagnosis not present

## 2018-03-26 ENCOUNTER — Encounter (INDEPENDENT_AMBULATORY_CARE_PROVIDER_SITE_OTHER): Payer: Self-pay

## 2018-03-26 ENCOUNTER — Encounter: Payer: Self-pay | Admitting: Cardiology

## 2018-03-26 ENCOUNTER — Ambulatory Visit (INDEPENDENT_AMBULATORY_CARE_PROVIDER_SITE_OTHER): Payer: Medicare Other | Admitting: Cardiology

## 2018-03-26 VITALS — BP 132/74 | HR 75 | Ht 62.0 in | Wt 169.6 lb

## 2018-03-26 DIAGNOSIS — E669 Obesity, unspecified: Secondary | ICD-10-CM | POA: Diagnosis not present

## 2018-03-26 DIAGNOSIS — I10 Essential (primary) hypertension: Secondary | ICD-10-CM

## 2018-03-26 DIAGNOSIS — G4733 Obstructive sleep apnea (adult) (pediatric): Secondary | ICD-10-CM

## 2018-03-26 NOTE — Patient Instructions (Signed)

## 2018-03-26 NOTE — Progress Notes (Signed)
Cardiology Office Note:    Date:  03/26/2018   ID:  Thana, Ramp 24-Apr-1941, MRN [id number]  PCP:  [name], MD  Cardiologist:  No primary care provider on file.    Referring MD: [name], MD   Chief Complaint  Patient presents with  . Sleep Apnea  . Hypertension    History of Present Illness:    Mackenzie King is a 77 y.o. female with a hx of mildOSA(AHI 6.35/hr with excessive daytime sleepiness and initial epworth sleepiness score of 15) on CPAP andHTN. She is doing well with her CPAP device and thinks that she has gotten used to it.  She tolerates the mask and feels the pressure is adequate.  Since going on CPAP she feels rested in the am and has no significant daytime sleepiness.  She denies any significant mouth or nasal dryness or nasal congestion.  She does not think that he snores.     Past Medical History:  Diagnosis Date  . Abdominal distention   . Asthma   . Asymptomatic gallstones   . 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 CKD (chronic kidney disease), stage III (HCC)    Dr Florene Glen  . Colon polyps    hyperplastic  . Dermatitis    intermittent Dr Shelia Media  . Dyslipidemia   . Facet arthropathy, cervical   . GERD (gastroesophageal reflux disease)   . Hearing loss   . Hypertension   . Nasal congestion   . Obstructive sleep apnea   . Osteoarthritis   . Osteopenia    mild  . Plantar fasciitis    s/p shock tx Dr Paulla Dolly  . Pure hypercholesterolemia   . Restless legs syndrome (RLS)   . Wheezing     Past Surgical History:  Procedure Laterality Date  . BREAST SURGERY  1996   breast biopsy - benign calcifications  . BREAST SURGERY  1977   adenofibroma removed - 3 biopsy's  . CHOLECYSTECTOMY  03/19/2012   Procedure: LAPAROSCOPIC CHOLECYSTECTOMY WITH INTRAOPERATIVE CHOLANGIOGRAM;  Surgeon: Adin Hector, MD;  Location: [facility];  Service: General;  Laterality: N/A;  . CHOLECYSTECTOMY  03/19/2012   Procedure: CHOLECYSTECTOMY;   Surgeon: Adin Hector, MD;  Location: [facility];  Service: General;  Laterality: N/A;  converted to open @ 1137  . EYE SURGERY  02/2012   bilateral cataracts with lens implants  . FOOT NEUROMA SURGERY     right foot  . LIPOMA EXCISION    . 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 TUBAL LIGATION  1971    Current Medications: Current Meds  Medication Sig  . acetaminophen (TYLENOL 8 HOUR ARTHRITIS PAIN) 650 MG CR tablet Take 1,300 mg by mouth daily.  Marland Kitchen albuterol (PROVENTIL HFA;VENTOLIN HFA) 108 (90 BASE) MCG/ACT inhaler Inhale 2 puffs into the lungs every 6 (six) hours as needed. For shortness of breath  . amLODipine (NORVASC) 2.5 MG tablet Take 2.5 mg by mouth daily.  . calcium gluconate 500 MG tablet Take 2 tablets by mouth daily.  . Carboxymeth-Glycerin-Polysorb (REFRESH OPTIVE MEGA-3 OP) Apply 1-2 drops to eye 3 (three) times daily.  . cetirizine (ZYRTEC) 10 MG tablet Take 10 mg by mouth at bedtime.   . Cholecalciferol (VITAMIN D) 2000 UNITS tablet Take 4,000 Units by mouth at bedtime.   . fluticasone (FLONASE) 50 MCG/ACT nasal spray Place 2 sprays into the nose daily as needed. For  allergies  . furosemide (LASIX) 40 MG tablet Take 40 mg by mouth as needed for fluid or edema.  . hydrOXYzine (ATARAX/VISTARIL) 25 MG tablet Take 25 mg by mouth at bedtime as needed. Take 2 tablets once per day at bedtime as needed.  Marland Kitchen lisinopril (PRINIVIL,ZESTRIL) 40 MG tablet Take 40 mg by mouth daily.  . Multiple Vitamins-Minerals (CENTRUM SILVER 50+WOMEN PO) Take 1 tablet by mouth daily.  Marland Kitchen oxybutynin (DITROPAN-XL) 10 MG 24 hr tablet Take 1 tablet by mouth daily.  . pravastatin (PRAVACHOL) 40 MG tablet Take 40 mg by mouth daily.   . ranitidine (ZANTAC) 300 MG tablet Take 300 mg by mouth daily.  . sodium chloride (MURO 128) 5 % ophthalmic ointment Place 1 application into the right eye at bedtime.     Allergies:   Crestor [rosuvastatin]; Sulfa antibiotics; and  Ciprofloxacin   Social History   Socioeconomic History  . Marital status: Widowed    Spouse name: Not on file  . Number of children: Not on file  . Years of education: Not on file  . Highest education level: Not on file  Occupational History  . Not on file  Social Needs  . Financial resource strain: Not on file  . Food insecurity:    Worry: Not on file    Inability: Not on file  . Transportation needs:    Medical: Not on file    Non-medical: Not on file  Tobacco Use  . Smoking status: Never Smoker  . Smokeless tobacco: Never Used  Substance and Sexual Activity  . Alcohol use: Yes    Comment: rare  . Drug use: No  . Sexual activity: Not on file  Lifestyle  . Physical activity:    Days per week: Not on file    Minutes per session: Not on file  . Stress: Not on file  Relationships  . Social connections:    Talks on phone: Not on file    Gets together: Not on file    Attends religious service: Not on file    Active member of club or organization: Not on file    Attends meetings of clubs or organizations: Not on file    Relationship status: Not on file  Other Topics Concern  . Not on file  Social History Narrative  . Not on file     Family History: The patient's family history includes Alzheimer's disease in her mother; Cancer in her mother, paternal grandfather, and sister; Colon polyps in her father; Hypertension in her mother; Stroke in her father; Transient ischemic attack in her mother.  ROS:   Please see the history of present illness.    ROS  All other systems reviewed and negative.   EKGs/Labs/Other Studies Reviewed:    The following studies were reviewed today: PAP download  EKG:  EKG is not ordered today.  Recent Labs: No results found for requested labs within last 8760 hours.   Recent Lipid Panel No results found for: CHOL, TRIG, HDL, CHOLHDL, VLDL, LDLCALC, LDLDIRECT  Physical Exam:    VS:  BP 132/74   Pulse 75   Ht 5\' 2"  (1.575 m)   Wt  169 lb 9.6 oz (76.9 kg)   SpO2 99%   BMI 31.02 kg/m     Wt Readings from Last 3 Encounters:  03/26/18 169 lb 9.6 oz (76.9 kg)  01/01/18 167 lb (75.8 kg)  12/28/16 163 lb (73.9 kg)     GEN:  Well nourished, well developed in  no acute distress HEENT: Normal NECK: No JVD; No carotid bruits LYMPHATICS: No lymphadenopathy CARDIAC: RRR, no murmurs, rubs, gallops RESPIRATORY:  Clear to auscultation without rales, wheezing or rhonchi  ABDOMEN: Soft, non-tender, non-distended MUSCULOSKELETAL:  No edema; No deformity  SKIN: Warm and dry NEUROLOGIC:  Alert and oriented x 3 PSYCHIATRIC:  Normal affect   ASSESSMENT:    1. Obstructive sleep apnea   2. Essential hypertension   3. Obesity (BMI 30-39.9)    PLAN:    In order of problems listed above:  1.  OSA - the patient is tolerating PAP therapy well without any problems. The PAP download was reviewed today and showed an AHI of 0.7/hr on 8 cm H2O with 100% compliance in using more than 4 hours nightly.  The patient has been using and benefiting from PAP use and will continue to benefit from therapy.   2.  HTN - BP is controlled on exam today.  She will continue on amlodipine 2.5mg  daily, Lisinopril 40mg  daily.    3.  Obesity - I have encouraged her to get into a routine exercise program and cut back on carbs and portions.    Medication Adjustments/Labs and Tests Ordered: Current medicines are reviewed at length with the patient today.  Concerns regarding medicines are outlined above.  No orders of the defined types were placed in this encounter.  No orders of the defined types were placed in this encounter.   Signed, Fransico Him, MD  03/26/2018 1:28 PM    [place]

## 2018-04-24 DIAGNOSIS — H00022 Hordeolum internum right lower eyelid: Secondary | ICD-10-CM | POA: Diagnosis not present

## 2018-04-30 DIAGNOSIS — Z905 Acquired absence of kidney: Secondary | ICD-10-CM | POA: Diagnosis not present

## 2018-04-30 DIAGNOSIS — N390 Urinary tract infection, site not specified: Secondary | ICD-10-CM | POA: Diagnosis not present

## 2018-04-30 DIAGNOSIS — Z85528 Personal history of other malignant neoplasm of kidney: Secondary | ICD-10-CM | POA: Diagnosis not present

## 2018-05-01 DIAGNOSIS — H00022 Hordeolum internum right lower eyelid: Secondary | ICD-10-CM | POA: Diagnosis not present

## 2018-05-30 DIAGNOSIS — J3089 Other allergic rhinitis: Secondary | ICD-10-CM | POA: Diagnosis not present

## 2018-05-30 DIAGNOSIS — L299 Pruritus, unspecified: Secondary | ICD-10-CM | POA: Diagnosis not present

## 2018-05-30 DIAGNOSIS — H1045 Other chronic allergic conjunctivitis: Secondary | ICD-10-CM | POA: Diagnosis not present

## 2018-05-30 DIAGNOSIS — J4540 Moderate persistent asthma, uncomplicated: Secondary | ICD-10-CM | POA: Diagnosis not present

## 2018-07-05 DIAGNOSIS — R399 Unspecified symptoms and signs involving the genitourinary system: Secondary | ICD-10-CM | POA: Diagnosis not present

## 2018-07-05 DIAGNOSIS — N3000 Acute cystitis without hematuria: Secondary | ICD-10-CM | POA: Diagnosis not present

## 2018-07-05 DIAGNOSIS — I10 Essential (primary) hypertension: Secondary | ICD-10-CM | POA: Diagnosis not present

## 2018-09-06 DIAGNOSIS — N183 Chronic kidney disease, stage 3 (moderate): Secondary | ICD-10-CM | POA: Diagnosis not present

## 2018-09-06 DIAGNOSIS — I129 Hypertensive chronic kidney disease with stage 1 through stage 4 chronic kidney disease, or unspecified chronic kidney disease: Secondary | ICD-10-CM | POA: Diagnosis not present

## 2018-09-06 DIAGNOSIS — E785 Hyperlipidemia, unspecified: Secondary | ICD-10-CM | POA: Diagnosis not present

## 2018-10-23 DIAGNOSIS — R300 Dysuria: Secondary | ICD-10-CM | POA: Diagnosis not present

## 2018-10-25 DIAGNOSIS — H1859 Other hereditary corneal dystrophies: Secondary | ICD-10-CM | POA: Diagnosis not present

## 2018-10-25 DIAGNOSIS — H04123 Dry eye syndrome of bilateral lacrimal glands: Secondary | ICD-10-CM | POA: Diagnosis not present

## 2018-11-12 DIAGNOSIS — H1859 Other hereditary corneal dystrophies: Secondary | ICD-10-CM | POA: Diagnosis not present

## 2018-11-12 DIAGNOSIS — H04123 Dry eye syndrome of bilateral lacrimal glands: Secondary | ICD-10-CM | POA: Diagnosis not present

## 2018-11-12 DIAGNOSIS — H16223 Keratoconjunctivitis sicca, not specified as Sjogren's, bilateral: Secondary | ICD-10-CM | POA: Diagnosis not present

## 2018-11-30 DIAGNOSIS — L821 Other seborrheic keratosis: Secondary | ICD-10-CM | POA: Diagnosis not present

## 2018-11-30 DIAGNOSIS — L720 Epidermal cyst: Secondary | ICD-10-CM | POA: Diagnosis not present

## 2018-12-10 DIAGNOSIS — Z1231 Encounter for screening mammogram for malignant neoplasm of breast: Secondary | ICD-10-CM | POA: Diagnosis not present

## 2018-12-27 DIAGNOSIS — H16223 Keratoconjunctivitis sicca, not specified as Sjogren's, bilateral: Secondary | ICD-10-CM | POA: Diagnosis not present

## 2018-12-27 DIAGNOSIS — H04123 Dry eye syndrome of bilateral lacrimal glands: Secondary | ICD-10-CM | POA: Diagnosis not present

## 2019-02-15 DIAGNOSIS — E785 Hyperlipidemia, unspecified: Secondary | ICD-10-CM | POA: Diagnosis not present

## 2019-02-15 DIAGNOSIS — J4520 Mild intermittent asthma, uncomplicated: Secondary | ICD-10-CM | POA: Diagnosis not present

## 2019-02-15 DIAGNOSIS — I129 Hypertensive chronic kidney disease with stage 1 through stage 4 chronic kidney disease, or unspecified chronic kidney disease: Secondary | ICD-10-CM | POA: Diagnosis not present

## 2019-02-15 DIAGNOSIS — N183 Chronic kidney disease, stage 3 (moderate): Secondary | ICD-10-CM | POA: Diagnosis not present

## 2019-02-15 DIAGNOSIS — F324 Major depressive disorder, single episode, in partial remission: Secondary | ICD-10-CM | POA: Diagnosis not present

## 2019-02-21 DIAGNOSIS — R300 Dysuria: Secondary | ICD-10-CM | POA: Diagnosis not present

## 2019-02-21 DIAGNOSIS — N390 Urinary tract infection, site not specified: Secondary | ICD-10-CM | POA: Diagnosis not present

## 2019-03-08 DIAGNOSIS — R350 Frequency of micturition: Secondary | ICD-10-CM | POA: Diagnosis not present

## 2019-03-08 DIAGNOSIS — Z85528 Personal history of other malignant neoplasm of kidney: Secondary | ICD-10-CM | POA: Diagnosis not present

## 2019-03-18 DIAGNOSIS — G4733 Obstructive sleep apnea (adult) (pediatric): Secondary | ICD-10-CM | POA: Diagnosis not present

## 2019-03-18 DIAGNOSIS — E041 Nontoxic single thyroid nodule: Secondary | ICD-10-CM | POA: Diagnosis not present

## 2019-03-18 DIAGNOSIS — N183 Chronic kidney disease, stage 3 unspecified: Secondary | ICD-10-CM | POA: Diagnosis not present

## 2019-03-18 DIAGNOSIS — Z23 Encounter for immunization: Secondary | ICD-10-CM | POA: Diagnosis not present

## 2019-03-18 DIAGNOSIS — E785 Hyperlipidemia, unspecified: Secondary | ICD-10-CM | POA: Diagnosis not present

## 2019-03-18 DIAGNOSIS — Z0000 Encounter for general adult medical examination without abnormal findings: Secondary | ICD-10-CM | POA: Diagnosis not present

## 2019-03-18 DIAGNOSIS — J4520 Mild intermittent asthma, uncomplicated: Secondary | ICD-10-CM | POA: Diagnosis not present

## 2019-03-18 DIAGNOSIS — I129 Hypertensive chronic kidney disease with stage 1 through stage 4 chronic kidney disease, or unspecified chronic kidney disease: Secondary | ICD-10-CM | POA: Diagnosis not present

## 2019-03-18 DIAGNOSIS — R7303 Prediabetes: Secondary | ICD-10-CM | POA: Diagnosis not present

## 2019-03-18 DIAGNOSIS — R609 Edema, unspecified: Secondary | ICD-10-CM | POA: Diagnosis not present

## 2019-03-22 DIAGNOSIS — E785 Hyperlipidemia, unspecified: Secondary | ICD-10-CM | POA: Diagnosis not present

## 2019-03-22 DIAGNOSIS — F324 Major depressive disorder, single episode, in partial remission: Secondary | ICD-10-CM | POA: Diagnosis not present

## 2019-03-22 DIAGNOSIS — J4520 Mild intermittent asthma, uncomplicated: Secondary | ICD-10-CM | POA: Diagnosis not present

## 2019-03-22 DIAGNOSIS — I129 Hypertensive chronic kidney disease with stage 1 through stage 4 chronic kidney disease, or unspecified chronic kidney disease: Secondary | ICD-10-CM | POA: Diagnosis not present

## 2019-04-10 ENCOUNTER — Telehealth: Payer: Self-pay | Admitting: *Deleted

## 2019-04-10 NOTE — Telephone Encounter (Signed)

## 2019-04-11 DIAGNOSIS — J4520 Mild intermittent asthma, uncomplicated: Secondary | ICD-10-CM | POA: Diagnosis not present

## 2019-04-11 DIAGNOSIS — F324 Major depressive disorder, single episode, in partial remission: Secondary | ICD-10-CM | POA: Diagnosis not present

## 2019-04-11 DIAGNOSIS — E785 Hyperlipidemia, unspecified: Secondary | ICD-10-CM | POA: Diagnosis not present

## 2019-04-11 DIAGNOSIS — I129 Hypertensive chronic kidney disease with stage 1 through stage 4 chronic kidney disease, or unspecified chronic kidney disease: Secondary | ICD-10-CM | POA: Diagnosis not present

## 2019-04-11 NOTE — Progress Notes (Addendum)
Virtual Visit via Telephone  Note   This visit type was conducted due to national recommendations for restrictions regarding the COVID-19 Pandemic (e.g. social distancing) in an effort to limit this patient's exposure and mitigate transmission in our community.  Due to her co-morbid illnesses, this patient is at least at moderate risk for complications without adequate follow up.  This format is felt to be most appropriate for this patient at this time.  All issues noted in this document were discussed and addressed.  A limited physical exam was performed with this format.  Please refer to the patient's chart for her consent to telehealth for [facility].   Evaluation Performed:  Follow-up visit  This visit type was conducted due to national recommendations for restrictions regarding the COVID-19 Pandemic (e.g. social distancing).  This format is felt to be most appropriate for this patient at this time.  All issues noted in this document were discussed and addressed.  No physical exam was performed (except for noted visual exam findings with Video Visits).  Please refer to the patient's chart (MyChart message for video visits and phone note for telephone visits) for the patient's consent to telehealth for [facility].  Date:  04/12/2019   ID:  Mackenzie King, Mackenzie King 07/03/1940, MRN [id number]  Patient Location:  Home  Provider location:   [place]  PCP:  [name], MD  Sleep Medicine:  Fransico Him, MD Electrophysiologist:  None   Chief Complaint:  OSA, HTN  History of Present Illness:    Mackenzie King is a 78 y.o. female who presents via audio/video conferencing for a telehealth visit today.    [name] is a 78 y.o. female with a hx of mildOSA(AHI 6.35/hr with excessive daytime sleepiness and initial epworth sleepiness score of 15) on CPAP andHTN. She is doing well with her CPAP device and thinks that she has gotten used to it.  She tolerates the mask and feels  the pressure is adequate.  Since going on CPAP she feels rested in the am and has no significant daytime sleepiness.  She denies any significant mouth or nasal dryness or nasal congestion.  She does not think that he snores.    The patient does not have symptoms concerning for COVID-19 infection (fever, chills, cough, or new shortness of breath).   Prior CV studies:   The following studies were reviewed today:  PAP compliance download  Past Medical History:  Diagnosis Date  . Abdominal distention   . Asthma   . Asymptomatic gallstones   . 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 CKD (chronic kidney disease), stage III    Dr Florene Glen  . Colon polyps    hyperplastic  . Dermatitis    intermittent Dr Shelia Media  . Dyslipidemia   . Facet arthropathy, cervical   . GERD (gastroesophageal reflux disease)   . Hearing loss   . Hypertension   . Nasal congestion   . Obstructive sleep apnea   . Osteoarthritis   . Osteopenia    mild  . Plantar fasciitis    s/p shock tx Dr Paulla Dolly  . Pure hypercholesterolemia   . Restless legs syndrome (RLS)   . Wheezing    Past Surgical History:  Procedure Laterality Date  . BREAST SURGERY  1996   breast biopsy - benign calcifications  . BREAST SURGERY  1977   adenofibroma removed - 3 biopsy's  . CHOLECYSTECTOMY  03/19/2012   Procedure: LAPAROSCOPIC CHOLECYSTECTOMY WITH  INTRAOPERATIVE CHOLANGIOGRAM;  Surgeon: Adin Hector, MD;  Location: [facility];  Service: General;  Laterality: N/A;  . CHOLECYSTECTOMY  03/19/2012   Procedure: CHOLECYSTECTOMY;  Surgeon: Adin Hector, MD;  Location: [facility];  Service: General;  Laterality: N/A;  converted to open @ 1137  . EYE SURGERY  02/2012   bilateral cataracts with lens implants  . FOOT NEUROMA SURGERY     right foot  . LIPOMA EXCISION    . 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 TUBAL LIGATION  1971     Current Meds  Medication Sig  . acetaminophen (TYLENOL  8 HOUR ARTHRITIS PAIN) 650 MG CR tablet Take 650 mg by mouth daily.   Marland Kitchen albuterol (PROVENTIL HFA;VENTOLIN HFA) 108 (90 BASE) MCG/ACT inhaler Inhale 2 puffs into the lungs every 6 (six) hours as needed. For shortness of breath  . b complex vitamins tablet Take 1 tablet by mouth daily.  . cetirizine (ZYRTEC) 10 MG tablet Take 10 mg by mouth at bedtime.   . Cholecalciferol (VITAMIN D) 2000 UNITS tablet Take 2,000 Units by mouth at bedtime.   . Coenzyme Q10 (COQ10) 100 MG CAPS Take 1 capsule by mouth daily.  . Cranberry-Vitamin C-Vitamin E (CRANBERRY PLUS VITAMIN C) 4200-20-3 MG-MG-UNIT CAPS Take 2 capsules by mouth daily.  . famotidine (PEPCID) 20 MG tablet Take 40 mg by mouth as needed for heartburn or indigestion.  . fluticasone (FLONASE) 50 MCG/ACT nasal spray Place 2 sprays into the nose daily as needed. For allergies  . furosemide (LASIX) 40 MG tablet Take 40 mg by mouth as needed for fluid or edema.  Marland Kitchen lisinopril (PRINIVIL,ZESTRIL) 40 MG tablet Take 40 mg by mouth daily.  . Melatonin 12 MG TABS Take 1 tablet by mouth as needed.  . Multiple Vitamins-Minerals (CENTRUM SILVER 50+WOMEN PO) Take 1 tablet by mouth daily.  . pravastatin (PRAVACHOL) 40 MG tablet Take 40 mg by mouth daily.   . Probiotic Product (PHILLIPS COLON HEALTH PO) Take 1 tablet by mouth daily.  . RESTASIS 0.05 % ophthalmic emulsion Apply 1 drop to eye 2 (two) times daily.  . solifenacin (VESICARE) 5 MG tablet Take 5 mg by mouth daily.  . SYMBICORT 160-4.5 MCG/ACT inhaler Place 2 puffs into both nostrils 2 (two) times daily.     Allergies:   Crestor [rosuvastatin], Sulfa antibiotics, and Ciprofloxacin   Social History   Tobacco Use  . Smoking status: Never Smoker  . Smokeless tobacco: Never Used  Substance Use Topics  . Alcohol use: Yes    Comment: rare  . Drug use: No     Family Hx: The patient's family history includes Alzheimer's disease in her mother; Cancer in her mother, paternal grandfather, and sister; Colon  polyps in her father; Hypertension in her mother; Stroke in her father; Transient ischemic attack in her mother.  ROS:   Please see the history of present illness.     All other systems reviewed and are negative.   Labs/Other Tests and Data Reviewed:    Recent Labs: No results found for requested labs within last 8760 hours.   Recent Lipid Panel No results found for: CHOL, TRIG, HDL, CHOLHDL, LDLCALC, LDLDIRECT  Wt Readings from Last 3 Encounters:  04/12/19 171 lb 8 oz (77.8 kg)  03/26/18 169 lb 9.6 oz (76.9 kg)  01/01/18 167 lb (75.8 kg)     Objective:    Vital Signs:  BP 125/76  Pulse 73   Ht 5\' 2"  (1.575 m)   Wt 171 lb 8 oz (77.8 kg)   BMI 31.37 kg/m    CONSTITUTIONAL:  Well nourished, well developed female in no acute distress.  EYES: anicteric MOUTH: oral mucosa is pink RESPIRATORY: Normal respiratory effort, symmetric expansion CARDIOVASCULAR: No peripheral edema SKIN: No rash, lesions or ulcers MUSCULOSKELETAL: no digital cyanosis NEURO: Cranial Nerves II-XII grossly intact, moves all extremities PSYCH: Intact judgement and insight.  A&O x 3, Mood/affect appropriate   ASSESSMENT & PLAN:    1.  OSA - The patient is tolerating PAP therapy well without any problems. The PAP download was reviewed today and showed an AHI of 0.8/hr on 8 cm H2O with 70% compliance in using more than 4 hours nightly.  The patient has been using and benefiting from PAP use and will continue to benefit from therapy. She would like to try a mask that does not make so many marks over her nose and face.  I have recommended that she try a Respironic Dreamwear under the nose FFM which I will arrange for her DME to fit.  2.  HTN -BP controlled on exam -continue amlodipine 2.5mg  daily and Lisinopril 40mg  daily -creatinine stable at 0.98  3.  Obesity -I have encouraged her to get into a routine exercise program and cut back on carbs and portions.   COVID-19 Education: The signs and  symptoms of COVID-19 were discussed with the patient and how to seek care for testing (follow up with PCP or arrange E-visit).  The importance of social distancing was discussed today.  Patient Risk:   After full review of this patient's clinical status, I feel that they are at least moderate risk at this time.  Time:   Today, I have spent 20 minutes directly with the patient on telemedicine discussing medical problems including OSA, HTN, obesity.  We also reviewed the symptoms of COVID 19 and the ways to protect against contracting the virus with telehealth technology.  I spent an additional 5 minutes reviewing patient's chart including PAP compliance download and labs.  Medication Adjustments/Labs and Tests Ordered: Current medicines are reviewed at length with the patient today.  Concerns regarding medicines are outlined above.  Tests Ordered: No orders of the defined types were placed in this encounter.  Medication Changes: No orders of the defined types were placed in this encounter.   Disposition:  Follow up in 1 year(s)  Signed, Fransico Him, MD  04/12/2019 10:47 AM    [facility]

## 2019-04-12 ENCOUNTER — Telehealth: Payer: Self-pay | Admitting: *Deleted

## 2019-04-12 ENCOUNTER — Encounter: Payer: Self-pay | Admitting: Cardiology

## 2019-04-12 ENCOUNTER — Telehealth (INDEPENDENT_AMBULATORY_CARE_PROVIDER_SITE_OTHER): Payer: Medicare Other | Admitting: Cardiology

## 2019-04-12 ENCOUNTER — Other Ambulatory Visit: Payer: Self-pay

## 2019-04-12 VITALS — BP 125/76 | HR 73 | Ht 62.0 in | Wt 171.5 lb

## 2019-04-12 DIAGNOSIS — I10 Essential (primary) hypertension: Secondary | ICD-10-CM | POA: Diagnosis not present

## 2019-04-12 DIAGNOSIS — G4733 Obstructive sleep apnea (adult) (pediatric): Secondary | ICD-10-CM

## 2019-04-12 DIAGNOSIS — E669 Obesity, unspecified: Secondary | ICD-10-CM

## 2019-04-12 NOTE — Patient Instructions (Signed)
Medication Instructions:  The current medical regimen is effective;  continue present plan and medications.  *If you need a refill on your cardiac medications before your next appointment, please call your pharmacy*  Follow-Up: At [facility], you and your health needs are our priority.  As part of our continuing mission to provide you with exceptional heart care, we have created designated Provider Care Teams.  These Care Teams include your primary Cardiologist (physician) and Advanced Practice Providers (APPs -  Physician Assistants and Nurse Practitioners) who all work together to provide you with the care you need, when you need it.  Your next appointment:   1 year  The format for your next appointment:   In Person  Provider:   You may see Fransico Him, MD or one of the following Advanced Practice Providers on your designated Care Team:    Melina Copa, PA-C  Ermalinda Barrios, PA-C   Thank you for choosing [facility]!!

## 2019-04-12 NOTE — Telephone Encounter (Signed)
-----   Message from Shellia Cleverly, RN sent at 04/12/2019 10:56 AM EST -----  ----- Message ----- From: Sueanne Margarita, MD Sent: 04/12/2019  10:47 AM EST To: Shellia Cleverly, RN  Followup with me in 1 year.  Please send a not to [name] to order a Respironics full face Dreamwear under the nose mask.  She will need to go to DME to get it fitted.

## 2019-04-12 NOTE — Telephone Encounter (Signed)
Order placed to [employer] via community message to order a Respironics full face Dreamwear under the nose mask. She will need to go to DME to get it fitted.

## 2019-04-23 DIAGNOSIS — H16223 Keratoconjunctivitis sicca, not specified as Sjogren's, bilateral: Secondary | ICD-10-CM | POA: Diagnosis not present

## 2019-04-23 DIAGNOSIS — H18593 Other hereditary corneal dystrophies, bilateral: Secondary | ICD-10-CM | POA: Diagnosis not present

## 2019-04-23 DIAGNOSIS — H04123 Dry eye syndrome of bilateral lacrimal glands: Secondary | ICD-10-CM | POA: Diagnosis not present

## 2019-06-06 DIAGNOSIS — E785 Hyperlipidemia, unspecified: Secondary | ICD-10-CM | POA: Diagnosis not present

## 2019-06-06 DIAGNOSIS — J4520 Mild intermittent asthma, uncomplicated: Secondary | ICD-10-CM | POA: Diagnosis not present

## 2019-06-06 DIAGNOSIS — I129 Hypertensive chronic kidney disease with stage 1 through stage 4 chronic kidney disease, or unspecified chronic kidney disease: Secondary | ICD-10-CM | POA: Diagnosis not present

## 2019-06-06 DIAGNOSIS — F324 Major depressive disorder, single episode, in partial remission: Secondary | ICD-10-CM | POA: Diagnosis not present

## 2019-06-11 DIAGNOSIS — J4540 Moderate persistent asthma, uncomplicated: Secondary | ICD-10-CM | POA: Diagnosis not present

## 2019-06-11 DIAGNOSIS — L299 Pruritus, unspecified: Secondary | ICD-10-CM | POA: Diagnosis not present

## 2019-06-11 DIAGNOSIS — H1045 Other chronic allergic conjunctivitis: Secondary | ICD-10-CM | POA: Diagnosis not present

## 2019-06-11 DIAGNOSIS — J3089 Other allergic rhinitis: Secondary | ICD-10-CM | POA: Diagnosis not present

## 2019-07-09 DIAGNOSIS — Z1159 Encounter for screening for other viral diseases: Secondary | ICD-10-CM | POA: Diagnosis not present

## 2019-07-12 DIAGNOSIS — Z86010 Personal history of colonic polyps: Secondary | ICD-10-CM | POA: Diagnosis not present

## 2019-07-12 DIAGNOSIS — K5730 Diverticulosis of large intestine without perforation or abscess without bleeding: Secondary | ICD-10-CM | POA: Diagnosis not present

## 2019-07-19 DIAGNOSIS — R300 Dysuria: Secondary | ICD-10-CM | POA: Diagnosis not present

## 2019-07-19 DIAGNOSIS — N3001 Acute cystitis with hematuria: Secondary | ICD-10-CM | POA: Diagnosis not present

## 2019-08-09 DIAGNOSIS — N183 Chronic kidney disease, stage 3 unspecified: Secondary | ICD-10-CM | POA: Diagnosis not present

## 2019-08-09 DIAGNOSIS — J4520 Mild intermittent asthma, uncomplicated: Secondary | ICD-10-CM | POA: Diagnosis not present

## 2019-08-09 DIAGNOSIS — I129 Hypertensive chronic kidney disease with stage 1 through stage 4 chronic kidney disease, or unspecified chronic kidney disease: Secondary | ICD-10-CM | POA: Diagnosis not present

## 2019-08-09 DIAGNOSIS — E785 Hyperlipidemia, unspecified: Secondary | ICD-10-CM | POA: Diagnosis not present

## 2019-08-09 DIAGNOSIS — F324 Major depressive disorder, single episode, in partial remission: Secondary | ICD-10-CM | POA: Diagnosis not present

## 2019-08-19 DIAGNOSIS — N183 Chronic kidney disease, stage 3 unspecified: Secondary | ICD-10-CM | POA: Diagnosis not present

## 2019-08-19 DIAGNOSIS — I129 Hypertensive chronic kidney disease with stage 1 through stage 4 chronic kidney disease, or unspecified chronic kidney disease: Secondary | ICD-10-CM | POA: Diagnosis not present

## 2019-09-16 DIAGNOSIS — M62838 Other muscle spasm: Secondary | ICD-10-CM | POA: Diagnosis not present

## 2019-09-16 DIAGNOSIS — R7303 Prediabetes: Secondary | ICD-10-CM | POA: Diagnosis not present

## 2019-09-16 DIAGNOSIS — I129 Hypertensive chronic kidney disease with stage 1 through stage 4 chronic kidney disease, or unspecified chronic kidney disease: Secondary | ICD-10-CM | POA: Diagnosis not present

## 2019-09-16 DIAGNOSIS — R29818 Other symptoms and signs involving the nervous system: Secondary | ICD-10-CM | POA: Diagnosis not present

## 2019-09-16 DIAGNOSIS — R6889 Other general symptoms and signs: Secondary | ICD-10-CM | POA: Diagnosis not present

## 2019-09-16 DIAGNOSIS — N183 Chronic kidney disease, stage 3 unspecified: Secondary | ICD-10-CM | POA: Diagnosis not present

## 2019-09-16 DIAGNOSIS — E785 Hyperlipidemia, unspecified: Secondary | ICD-10-CM | POA: Diagnosis not present

## 2019-09-16 DIAGNOSIS — K5901 Slow transit constipation: Secondary | ICD-10-CM | POA: Diagnosis not present

## 2019-09-16 DIAGNOSIS — R609 Edema, unspecified: Secondary | ICD-10-CM | POA: Diagnosis not present

## 2019-09-16 DIAGNOSIS — Z23 Encounter for immunization: Secondary | ICD-10-CM | POA: Diagnosis not present

## 2019-09-23 ENCOUNTER — Encounter: Payer: Self-pay | Admitting: Physical Therapy

## 2019-09-23 ENCOUNTER — Ambulatory Visit: Payer: Medicare Other | Attending: Family Medicine | Admitting: Physical Therapy

## 2019-09-23 ENCOUNTER — Other Ambulatory Visit: Payer: Self-pay

## 2019-09-23 DIAGNOSIS — M542 Cervicalgia: Secondary | ICD-10-CM | POA: Diagnosis not present

## 2019-09-23 DIAGNOSIS — R293 Abnormal posture: Secondary | ICD-10-CM | POA: Diagnosis not present

## 2019-09-23 DIAGNOSIS — M62838 Other muscle spasm: Secondary | ICD-10-CM | POA: Diagnosis not present

## 2019-09-23 NOTE — Therapy (Signed)
[place], [place], [address] Phone: [phone]   Fax:  [phone]  Physical Therapy Evaluation  Patient Details  Name: Mackenzie King MRN: [id number] Date of Birth: November 29, 1940 Referring Provider (PT): [name], MD    Encounter Date: 09/23/2019  PT End of Session - 09/23/19 1202    Visit Number  1    Number of Visits  13    Date for PT Re-Evaluation  11/04/19    Authorization Type  MCR: Kx mod at 15th visit    Progress Note Due on Visit  10    PT Start Time  1146    PT Stop Time  1230    PT Time Calculation (min)  44 min    Activity Tolerance  Patient tolerated treatment well    Behavior During Therapy  [facility] for tasks assessed/performed       Past Medical History:  Diagnosis Date  . Abdominal distention   . Asthma   . Asymptomatic gallstones   . 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 CKD (chronic kidney disease), stage III    Dr Florene Glen  . Colon polyps    hyperplastic  . Dermatitis    intermittent Dr Shelia Media  . Dyslipidemia   . Facet arthropathy, cervical   . GERD (gastroesophageal reflux disease)   . Hearing loss   . Hypertension   . Nasal congestion   . Obstructive sleep apnea   . Osteoarthritis   . Osteopenia    mild  . Plantar fasciitis    s/p shock tx Dr Paulla Dolly  . Pure hypercholesterolemia   . Restless legs syndrome (RLS)   . Wheezing     Past Surgical History:  Procedure Laterality Date  . BREAST SURGERY  1996   breast biopsy - benign calcifications  . BREAST SURGERY  1977   adenofibroma removed - 3 biopsy's  . CHOLECYSTECTOMY  03/19/2012   Procedure: LAPAROSCOPIC CHOLECYSTECTOMY WITH INTRAOPERATIVE CHOLANGIOGRAM;  Surgeon: Adin Hector, MD;  Location: [facility];  Service: General;  Laterality: N/A;  . CHOLECYSTECTOMY  03/19/2012   Procedure: CHOLECYSTECTOMY;  Surgeon: Adin Hector, MD;  Location: [facility];  Service: General;  Laterality: N/A;  converted to  open @ 1137  . EYE SURGERY  02/2012   bilateral cataracts with lens implants  . FOOT NEUROMA SURGERY     right foot  . LIPOMA EXCISION    . 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 TUBAL LIGATION  1971    There were no vitals filed for this visit.   Subjective Assessment - 09/23/19 1152    Subjective  pt is a 79 y.o F with CC of  chronic L sided neck pain that started over year ag with no preceding causing, and reports associated stiffness in the mid back with prolonged standing progressing in th elast 6 months.  She reports having fall  which she describes partially as a trip and partially losing balance that occurred on last Friday, but hasn't had any other falls in the last 6 months. She denies any N/T or HA.    How long can you sit comfortably?  unlimited    How long can you stand comfortably?  unlimited    How long can you walk comfortably?  unlimited    Currently in Pain?  Yes    Pain Score  0-No pain   at  worst 8/10   Pain Location  Neck    Pain Orientation  Left    Pain Descriptors / Indicators  Tightness    Pain Type  Chronic pain    Pain Onset  More than a month ago    Pain Frequency  Once a week    Aggravating Factors   unsure    Pain Relieving Factors  moving head    Multiple Pain Sites  Yes    Pain Score  5    Pain Location  Back    Pain Orientation  Right;Left;Mid    Pain Descriptors / Indicators  Aching    Pain Type  Chronic pain    Pain Frequency  Intermittent    Aggravating Factors   prlonged standing with bil UE use, vacuuming    Pain Relieving Factors  sitting and resting    Effect of Pain on Daily Activities  limited enduranc with prlonged standing with UE use         [facility] PT Assessment - 09/23/19 0001      Assessment   Medical Diagnosis  Other muscle spasm [id number]    Referring Provider (PT)  [name], MD     Onset Date/Surgical Date  --   neck over a year ago, thoracic pain 6 months   Hand Dominance   Right      Precautions   Precautions  None      Restrictions   Weight Bearing Restrictions  No      Balance Screen   Has the patient fallen in the past 6 months  Yes    How many times?  1    Has the patient had a decrease in activity level because of a fear of falling?   No    Is the patient reluctant to leave their home because of a fear of falling?   No      Home Environment   Living Environment  Private residence    Living Arrangements  Alone      Prior Function   Level of Independence  Independent      Observation/Other Assessments   Focus on Therapeutic Outcomes (FOTO)   50% limited   predicted 40% limited     Posture/Postural Control   Posture/Postural Control  Postural limitations    Postural Limitations  Rounded Shoulders;Forward head      ROM / Strength   AROM / PROM / Strength  AROM;Strength;PROM      AROM   AROM Assessment Site  Cervical    Cervical Flexion  62   end range tightness on the R    Cervical Extension  38   end range tightness on the R    Cervical - Right Side Bend  10   pain on ipsilateral side but not concordant pain   Cervical - Left Side Bend  22    Cervical - Right Rotation  54   end range tightness on the R    Cervical - Left Rotation  61      Strength   Overall Strength  Within functional limits for tasks performed    Strength Assessment Site  Shoulder    Right/Left Shoulder  Right;Left      Palpation   Palpation comment  TTP along bil upper trap/ levator scapulae and sub-occipitals R>L                Objective measurements completed on examination: See above findings.      [place]  Adult PT Treatment/Exercise - 09/23/19 0001      Exercises   Exercises  Neck      Manual Therapy   Manual therapy comments  MTPR along bil upper trap/ levator scapuale x 1 ea.      Neck Exercises: Stretches   Upper Trapezius Stretch  2 reps;30 seconds    Levator Stretch  2 reps;30 seconds             PT Education - 09/23/19  1202    Education Details  evaluation findings, POC, goals, HEP with proper form/ rationale    Person(s) Educated  Patient    Methods  Explanation;Verbal cues;Handout    Comprehension  Verbalized understanding;Verbal cues required       PT Short Term Goals - 09/23/19 1300      PT SHORT TERM GOAL #1   Title  pt to be I with initial HEP    Time  3    Period  Weeks    Status  New    Target Date  10/14/19        PT Long Term Goals - 09/23/19 1300      PT LONG TERM GOAL #1   Title  pt to be able to verbalize/ demo efficient posture and lifting mechanics to reduce and prevent neck pain/ stiffness    Time  6    Period  Weeks    Status  New    Target Date  11/04/19      PT LONG TERM GOAL #2   Title  increase neck rotatoin/ sidebending by >/= 6 degrees with </= 1/10 pain for functional ROM required for driving and ADLS    Time  6    Period  Weeks    Status  New    Target Date  11/04/19      PT LONG TERM GOAL #3   Title  pt to be able to do standing ADLs at home maintaining efficient posture / positioning with </= 1/10 pain in the mid back for QOL    Time  6    Period  Weeks    Status  New    Target Date  11/04/19      PT LONG TERM GOAL #4   Title  increase FOTO score to </= 40% limited to demo improvement in function    Time  6    Period  Weeks    Status  New    Target Date  11/04/19      PT LONG TERM GOAL #5   Title  pt to be I with all HEP given to maintain and progress current level of function    Time  6    Period  Weeks    Status  New    Target Date  11/04/19             Plan - 09/23/19 1254    Clinical Impression Statement  pt presents to OPPT with CC of chronic neck pain/ stiffness with on specific MOI, and mid thoracic soreness with prolonged standing/ UE use. She demonstrates functional cervical ROM with end ranges soreness noted with flexion/ extension and bil rotation.  TTP along bil upper trap/levator scapuale and sub-occipitals with mulitple  trigger points which she responded well to MTPR and stretching. She would benefit from physical therapy to decrease muscle spasm, improve cerivcal ROM, and maximize funciton by addressing the deficits listed.    Stability/Clinical Decision Making  Stable/Uncomplicated    Clinical  Decision Making  Low    Rehab Potential  Good    PT Frequency  2x / week    PT Duration  6 weeks    PT Treatment/Interventions  ADLs/Self Care Home Management;Cryotherapy;Electrical Stimulation;Iontophoresis 4mg /ml Dexamethasone;Moist Heat;Traction;Ultrasound;Gait training;Therapeutic activities;Therapeutic exercise;Balance training;Neuromuscular re-education;Manual techniques;Dry needling;Passive range of motion;Taping;Patient/family education    PT Next Visit Plan  review/ update HEP: Discuss DN, STW along bil upper trap/ levator scapuale, sub-occipital release, posterior shoulder strengthening, posture enducation/ handout    PT Home Exercise Plan  [id number] - supine chin tuck, upper trap/ levator scapulae stretch, scapular retraction.    Consulted and Agree with Plan of Care  Patient       Patient will benefit from skilled therapeutic intervention in order to improve the following deficits and impairments:  Improper body mechanics, Increased muscle spasms, Pain, Postural dysfunction, Decreased activity tolerance, Decreased range of motion  Visit Diagnosis: Cervicalgia  Other muscle spasm  Abnormal posture     Problem List Patient Active Problem List   Diagnosis Date Noted  . Hypertension   . Obstructive sleep apnea 05/09/2013  . Obesity (BMI 30-39.9) 05/09/2013  . Gallstones 02/15/2012    Starr Lake PT, DPT, LAT, ATC  09/23/19  1:06 PM      [facility] [address] [place], [place], [address] Phone: [phone]   Fax:  [phone]  Name: Mackenzie King MRN: [id number] Date of Birth: 04-Dec-1940

## 2019-10-01 ENCOUNTER — Encounter: Payer: Self-pay | Admitting: Physical Therapy

## 2019-10-01 ENCOUNTER — Other Ambulatory Visit: Payer: Self-pay

## 2019-10-01 ENCOUNTER — Ambulatory Visit: Payer: Medicare Other | Admitting: Physical Therapy

## 2019-10-01 DIAGNOSIS — R293 Abnormal posture: Secondary | ICD-10-CM | POA: Diagnosis not present

## 2019-10-01 DIAGNOSIS — M62838 Other muscle spasm: Secondary | ICD-10-CM

## 2019-10-01 DIAGNOSIS — M542 Cervicalgia: Secondary | ICD-10-CM | POA: Diagnosis not present

## 2019-10-01 NOTE — Patient Instructions (Signed)
Access Code: [email or web address] URL: [email or web address] Date: 10/01/2019 Prepared by: Hessie Diener  Exercises Doorway Pec Stretch at 90 Degrees Abduction - 1 x daily - 7 x weekly - 1 sets - 3 reps - 10 hold Thoracic Extension Mobilization with Noodle - 1 x daily - 7 x weekly - 1 sets - 10 reps - 5 hold Sidelying Thoracic Rotation with Open Book - 1 x daily - 7 x weekly - 1 sets - 10 reps - 5 hold

## 2019-10-01 NOTE — Therapy (Signed)
[place], [place], [address] Phone: [phone]   Fax:  [phone]  Physical Therapy Treatment  Patient Details  Name: Mackenzie King MRN: [id number] Date of Birth: 1941/05/12 Referring Provider (PT): [name], MD    Encounter Date: 10/01/2019  PT End of Session - 10/01/19 1320    Visit Number  2    Number of Visits  13    Date for PT Re-Evaluation  11/04/19    Authorization Type  MCR: Kx mod at 15th visit    PT Start Time  1315    PT Stop Time  1400    PT Time Calculation (min)  45 min       Past Medical History:  Diagnosis Date  . Abdominal distention   . Asthma   . Asymptomatic gallstones   . 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 CKD (chronic kidney disease), stage III    Dr Florene Glen  . Colon polyps    hyperplastic  . Dermatitis    intermittent Dr Shelia Media  . Dyslipidemia   . Facet arthropathy, cervical   . GERD (gastroesophageal reflux disease)   . Hearing loss   . Hypertension   . Nasal congestion   . Obstructive sleep apnea   . Osteoarthritis   . Osteopenia    mild  . Plantar fasciitis    s/p shock tx Dr Paulla Dolly  . Pure hypercholesterolemia   . Restless legs syndrome (RLS)   . Wheezing     Past Surgical History:  Procedure Laterality Date  . BREAST SURGERY  1996   breast biopsy - benign calcifications  . BREAST SURGERY  1977   adenofibroma removed - 3 biopsy's  . CHOLECYSTECTOMY  03/19/2012   Procedure: LAPAROSCOPIC CHOLECYSTECTOMY WITH INTRAOPERATIVE CHOLANGIOGRAM;  Surgeon: Adin Hector, MD;  Location: [facility];  Service: General;  Laterality: N/A;  . CHOLECYSTECTOMY  03/19/2012   Procedure: CHOLECYSTECTOMY;  Surgeon: Adin Hector, MD;  Location: [facility];  Service: General;  Laterality: N/A;  converted to open @ 1137  . EYE SURGERY  02/2012   bilateral cataracts with lens implants  . FOOT NEUROMA SURGERY     right foot  . LIPOMA EXCISION    .  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 TUBAL LIGATION  1971    There were no vitals filed for this visit.  Subjective Assessment - 10/01/19 1319    Subjective  Going alright with the exercises. The chin tuck was awkward.    Currently in Pain?  No/denies                       [facility] Adult PT Treatment/Exercise - 10/01/19 0001      Neck Exercises: Seated   Postural Training  seated chin tucks, scap retractions       Neck Exercises: Supine   Neck Retraction  10 reps    Neck Retraction Limitations  with towel roll at occiput      Manual Therapy   Manual therapy comments  PROM SB and roation , sub occipital release       Neck Exercises: Stretches   Upper Trapezius Stretch  2 reps;30 seconds    Levator Stretch  2 reps;30 seconds    Corner Stretch  3 reps;10 seconds    Other Neck Stretches  open books , thoracic extension in chair-  2 different levels (pllows used for seat height)     Other Neck Stretches  supine with horizontal towel - UE flexion and Pec stretch elbows bent              PT Education - 10/01/19 1406    Education Details  HEP    Person(s) Educated  Patient    Methods  Explanation;Handout    Comprehension  Verbalized understanding       PT Short Term Goals - 09/23/19 1300      PT SHORT TERM GOAL #1   Title  pt to be I with initial HEP    Time  3    Period  Weeks    Status  New    Target Date  10/14/19        PT Long Term Goals - 09/23/19 1300      PT LONG TERM GOAL #1   Title  pt to be able to verbalize/ demo efficient posture and lifting mechanics to reduce and prevent neck pain/ stiffness    Time  6    Period  Weeks    Status  New    Target Date  11/04/19      PT LONG TERM GOAL #2   Title  increase neck rotatoin/ sidebending by >/= 6 degrees with </= 1/10 pain for functional ROM required for driving and ADLS    Time  6    Period  Weeks    Status  New    Target Date  11/04/19       PT LONG TERM GOAL #3   Title  pt to be able to do standing ADLs at home maintaining efficient posture / positioning with </= 1/10 pain in the mid back for QOL    Time  6    Period  Weeks    Status  New    Target Date  11/04/19      PT LONG TERM GOAL #4   Title  increase FOTO score to </= 40% limited to demo improvement in function    Time  6    Period  Weeks    Status  New    Target Date  11/04/19      PT LONG TERM GOAL #5   Title  pt to be I with all HEP given to maintain and progress current level of function    Time  6    Period  Weeks    Status  New    Target Date  11/04/19            Plan - 10/01/19 1321    Clinical Impression Statement  Pt reports no pain upin arrival. She notes pain with attempting good posture and with washing dishes. Reviewed HEP and progressed with cervical and thoracic mobility exercises. Manual for neck ROM and sub occipital release. She felt fine at end of session. Updated HEP.    PT Next Visit Plan  review/ update HEP: Discuss DN, STW along bil upper trap/ levator scapuale, sub-occipital release, posterior shoulder strengthening, posture enducation/ handout    PT Home Exercise Plan  [id number] - supine chin tuck, upper trap/ levator scapulae stretch, scapular retraction.[id number]: open books, pec stretch in doorway , thoracic extension over towel roll       Patient will benefit from skilled therapeutic intervention in order to improve the following deficits and impairments:  Improper body mechanics, Increased muscle spasms, Pain, Postural dysfunction, Decreased activity tolerance, Decreased range of motion  Visit Diagnosis: Cervicalgia  Other muscle spasm  Abnormal posture     Problem List Patient Active Problem List   Diagnosis Date Noted  . Hypertension   . Obstructive sleep apnea 05/09/2013  . Obesity (BMI 30-39.9) 05/09/2013  . Gallstones 02/15/2012    Dorene Ar, PTA 10/01/2019, 2:11 PM  [facility] [address] [place], [place], [address] Phone: [phone]   Fax:  [phone]  Name: Mackenzie King MRN: [id number] Date of Birth: Sep 23, 1940

## 2019-10-09 ENCOUNTER — Ambulatory Visit: Payer: Medicare Other | Admitting: Physical Therapy

## 2019-10-09 ENCOUNTER — Other Ambulatory Visit: Payer: Self-pay

## 2019-10-09 DIAGNOSIS — R293 Abnormal posture: Secondary | ICD-10-CM | POA: Diagnosis not present

## 2019-10-09 DIAGNOSIS — M542 Cervicalgia: Secondary | ICD-10-CM | POA: Diagnosis not present

## 2019-10-09 DIAGNOSIS — M62838 Other muscle spasm: Secondary | ICD-10-CM | POA: Diagnosis not present

## 2019-10-09 NOTE — Therapy (Signed)
[place], [place], [address] Phone: [phone]   Fax:  [phone]  Physical Therapy Treatment  Patient Details  Name: Mackenzie King MRN: [id number] Date of Birth: 1941/01/01 Referring Provider (PT): [name], MD    Encounter Date: 10/09/2019  PT End of Session - 10/09/19 1145    Visit Number  3    Number of Visits  13    Date for PT Re-Evaluation  11/04/19    Authorization Type  MCR: Kx mod at 15th visit    PT Start Time  [id number]    PT Stop Time  1225    PT Time Calculation (min)  38 min       Past Medical History:  Diagnosis Date  . Abdominal distention   . Asthma   . Asymptomatic gallstones   . 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 CKD (chronic kidney disease), stage III    Dr Florene Glen  . Colon polyps    hyperplastic  . Dermatitis    intermittent Dr Shelia Media  . Dyslipidemia   . Facet arthropathy, cervical   . GERD (gastroesophageal reflux disease)   . Hearing loss   . Hypertension   . Nasal congestion   . Obstructive sleep apnea   . Osteoarthritis   . Osteopenia    mild  . Plantar fasciitis    s/p shock tx Dr Paulla Dolly  . Pure hypercholesterolemia   . Restless legs syndrome (RLS)   . Wheezing     Past Surgical History:  Procedure Laterality Date  . BREAST SURGERY  1996   breast biopsy - benign calcifications  . BREAST SURGERY  1977   adenofibroma removed - 3 biopsy's  . CHOLECYSTECTOMY  03/19/2012   Procedure: LAPAROSCOPIC CHOLECYSTECTOMY WITH INTRAOPERATIVE CHOLANGIOGRAM;  Surgeon: Adin Hector, MD;  Location: [facility];  Service: General;  Laterality: N/A;  . CHOLECYSTECTOMY  03/19/2012   Procedure: CHOLECYSTECTOMY;  Surgeon: Adin Hector, MD;  Location: [facility];  Service: General;  Laterality: N/A;  converted to open @ 1137  . EYE SURGERY  02/2012   bilateral cataracts with lens implants  . FOOT NEUROMA SURGERY     right foot  . LIPOMA EXCISION    .  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 TUBAL LIGATION  1971    There were no vitals filed for this visit.  Subjective Assessment - 10/09/19 1149    Subjective  My back is better. I do not have any more cramps in my neck. Still do not think I can turn my head as far as I should.    Currently in Pain?  No/denies    Pain Score  5     Pain Location  Neck    Pain Orientation  Right    Pain Descriptors / Indicators  Tightness    Pain Type  Chronic pain    Aggravating Factors   turning head    Pain Relieving Factors  being still         [facility] PT Assessment - 10/09/19 0001      AROM   Cervical - Right Side Bend  18    Cervical - Left Side Bend  24    Cervical - Right Rotation  52   pain right neck   Cervical - Left Rotation  52  [facility] Adult PT Treatment/Exercise - 10/09/19 0001      Neck Exercises: Theraband   Rows  20 reps    Rows Limitations  Green       Neck Exercises: Supine   Neck Retraction  10 reps    Neck Retraction Limitations  into pillow    Other Supine Exercise  neck stab: chin tuck with arm circles, horizontal abduction , alternating UE flexion x 10 each     Other Supine Exercise  chin tuck with head lift from pillow 5 sec x 5       Neck Exercises: Stretches   Upper Trapezius Stretch  2 reps;30 seconds    Levator Stretch  2 reps;30 seconds    Corner Stretch  3 reps;10 seconds    Other Neck Stretches  open books , thoracic extension in chair- 2 different levels (pllows used for seat height)     Other Neck Stretches  supine with horizontal towel - UE flexion and Pec stretch elbows bent              PT Education - 10/09/19 1226    Education Details  HEP    Person(s) Educated  Patient    Methods  Explanation;Handout    Comprehension  Verbalized understanding       PT Short Term Goals - 09/23/19 1300      PT SHORT TERM GOAL #1   Title  pt to be I with initial HEP    Time  3     Period  Weeks    Status  New    Target Date  10/14/19        PT Long Term Goals - 09/23/19 1300      PT LONG TERM GOAL #1   Title  pt to be able to verbalize/ demo efficient posture and lifting mechanics to reduce and prevent neck pain/ stiffness    Time  6    Period  Weeks    Status  New    Target Date  11/04/19      PT LONG TERM GOAL #2   Title  increase neck rotatoin/ sidebending by >/= 6 degrees with </= 1/10 pain for functional ROM required for driving and ADLS    Time  6    Period  Weeks    Status  New    Target Date  11/04/19      PT LONG TERM GOAL #3   Title  pt to be able to do standing ADLs at home maintaining efficient posture / positioning with </= 1/10 pain in the mid back for QOL    Time  6    Period  Weeks    Status  New    Target Date  11/04/19      PT LONG TERM GOAL #4   Title  increase FOTO score to </= 40% limited to demo improvement in function    Time  6    Period  Weeks    Status  New    Target Date  11/04/19      PT LONG TERM GOAL #5   Title  pt to be I with all HEP given to maintain and progress current level of function    Time  6    Period  Weeks    Status  New    Target Date  11/04/19            Plan - 10/09/19 1158    Clinical Impression Statement  Pt reports less neck cramping however still has pain with cervical rotation. AROM for rotaton has decreased, side bend improved. Continued with postural ROM and strenthening. Began scapular bands and she tolerated session well.    PT Next Visit Plan  review/ update HEP: Discuss DN, STW along bil upper trap/ levator scapuale, sub-occipital release, posterior shoulder strengthening, posture enducation/ handout    PT Home Exercise Plan  [id number] - supine chin tuck, upper trap/ levator scapulae stretch, scapular retraction.[id number]: open books, pec stretch in doorway , thoracic extension over towel roll, green band row       Patient will benefit from skilled therapeutic intervention in  order to improve the following deficits and impairments:  Improper body mechanics, Increased muscle spasms, Pain, Postural dysfunction, Decreased activity tolerance, Decreased range of motion  Visit Diagnosis: Cervicalgia  Other muscle spasm  Abnormal posture     Problem List Patient Active Problem List   Diagnosis Date Noted  . Hypertension   . Obstructive sleep apnea 05/09/2013  . Obesity (BMI 30-39.9) 05/09/2013  . Gallstones 02/15/2012    Dorene Ar , PTA 10/09/2019, 1:01 PM  [facility] [address] [place], [place], [address] Phone: [phone]   Fax:  [phone]  Name: Mackenzie King MRN: [id number] Date of Birth: Jan 09, 1941

## 2019-10-09 NOTE — Patient Instructions (Signed)
Access Code: [email or web address] URL: [email or web address] Date: 10/09/2019 Prepared by: Hessie Diener  Exercises Standing Row with Anchored Resistance - 2 x daily - 7 x weekly - 3 sets - 10 reps - 5 hold

## 2019-10-11 ENCOUNTER — Encounter: Payer: Self-pay | Admitting: Physical Therapy

## 2019-10-11 ENCOUNTER — Other Ambulatory Visit: Payer: Self-pay

## 2019-10-11 ENCOUNTER — Ambulatory Visit: Payer: Medicare Other | Admitting: Physical Therapy

## 2019-10-11 DIAGNOSIS — R293 Abnormal posture: Secondary | ICD-10-CM

## 2019-10-11 DIAGNOSIS — M542 Cervicalgia: Secondary | ICD-10-CM

## 2019-10-11 DIAGNOSIS — M62838 Other muscle spasm: Secondary | ICD-10-CM | POA: Diagnosis not present

## 2019-10-11 NOTE — Therapy (Signed)
[place], [place], [address] Phone: [phone]   Fax:  [phone]  Physical Therapy Treatment  Patient Details  Name: Mackenzie King MRN: [id number] Date of Birth: 01/04/1941 Referring Provider (PT): [name], MD    Encounter Date: 10/11/2019  PT End of Session - 10/11/19 1050    Visit Number  4    Number of Visits  13    Date for PT Re-Evaluation  11/04/19    Authorization Type  MCR: Kx mod at 15th visit    Progress Note Due on Visit  10    PT Start Time  [id number]    PT Stop Time  1130    PT Time Calculation (min)  43 min    Activity Tolerance  Patient tolerated treatment well    Behavior During Therapy  [facility] for tasks assessed/performed       Past Medical History:  Diagnosis Date  . Abdominal distention   . Asthma   . Asymptomatic gallstones   . 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 CKD (chronic kidney disease), stage III    Dr Florene Glen  . Colon polyps    hyperplastic  . Dermatitis    intermittent Dr Shelia Media  . Dyslipidemia   . Facet arthropathy, cervical   . GERD (gastroesophageal reflux disease)   . Hearing loss   . Hypertension   . Nasal congestion   . Obstructive sleep apnea   . Osteoarthritis   . Osteopenia    mild  . Plantar fasciitis    s/p shock tx Dr Paulla Dolly  . Pure hypercholesterolemia   . Restless legs syndrome (RLS)   . Wheezing     Past Surgical History:  Procedure Laterality Date  . BREAST SURGERY  1996   breast biopsy - benign calcifications  . BREAST SURGERY  1977   adenofibroma removed - 3 biopsy's  . CHOLECYSTECTOMY  03/19/2012   Procedure: LAPAROSCOPIC CHOLECYSTECTOMY WITH INTRAOPERATIVE CHOLANGIOGRAM;  Surgeon: Adin Hector, MD;  Location: [facility];  Service: General;  Laterality: N/A;  . CHOLECYSTECTOMY  03/19/2012   Procedure: CHOLECYSTECTOMY;  Surgeon: Adin Hector, MD;  Location: [facility];  Service: General;  Laterality: N/A;  converted to  open @ 1137  . EYE SURGERY  02/2012   bilateral cataracts with lens implants  . FOOT NEUROMA SURGERY     right foot  . LIPOMA EXCISION    . 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 TUBAL LIGATION  1971    There were no vitals filed for this visit.  Subjective Assessment - 10/11/19 1051    Subjective  " I think everything is improving I haven't had any cramps in my neck. my back is doing much better and I feel my balance is better."    Currently in Pain?  No/denies         [facility] PT Assessment - 10/11/19 0001      Assessment   Medical Diagnosis  Other muscle spasm [id number]    Referring Provider (PT)  [name], MD                     [facility] Adult PT Treatment/Exercise - 10/11/19 1100      Neck Exercises: Machines for Strengthening   Nustep  L5x 5 min UE/LE      Neck Exercises: Theraband   Shoulder Extension  20  reps;Green    Rows  20 reps;Green    Horizontal ABduction  Green;15 reps      Manual Therapy   Manual therapy comments  MTPR along the bil levator scapulae      Neck Exercises: Stretches   Levator Stretch  1 rep;30 seconds;Left;Right          Balance Exercises - 10/11/19 1138      Balance Exercises: Standing   Standing Eyes Opened  Narrow base of support (BOS);2 reps;30 secs    Standing Eyes Closed  2 reps;30 secs;Narrow base of support (BOS)    Tandem Stance  Eyes open;Eyes closed;2 reps;30 secs    Gait with Head Turns  Forward;4 reps   pt exhibited bil deviation to the R          PT Short Term Goals - 09/23/19 1300      PT SHORT TERM GOAL #1   Title  pt to be I with initial HEP    Time  3    Period  Weeks    Status  New    Target Date  10/14/19        PT Long Term Goals - 09/23/19 1300      PT LONG TERM GOAL #1   Title  pt to be able to verbalize/ demo efficient posture and lifting mechanics to reduce and prevent neck pain/ stiffness    Time  6    Period  Weeks    Status  New     Target Date  11/04/19      PT LONG TERM GOAL #2   Title  increase neck rotatoin/ sidebending by >/= 6 degrees with </= 1/10 pain for functional ROM required for driving and ADLS    Time  6    Period  Weeks    Status  New    Target Date  11/04/19      PT LONG TERM GOAL #3   Title  pt to be able to do standing ADLs at home maintaining efficient posture / positioning with </= 1/10 pain in the mid back for QOL    Time  6    Period  Weeks    Status  New    Target Date  11/04/19      PT LONG TERM GOAL #4   Title  increase FOTO score to </= 40% limited to demo improvement in function    Time  6    Period  Weeks    Status  New    Target Date  11/04/19      PT LONG TERM GOAL #5   Title  pt to be I with all HEP given to maintain and progress current level of function    Time  6    Period  Weeks    Status  New    Target Date  11/04/19            Plan - 10/11/19 1149    Clinical Impression Statement  pt reports significant imrpovement in neck and back pain. continued working posterior shoulder strengthening and trigger point release techniques focusing on the bil levator scapulae. 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    PT Treatment/Interventions  ADLs/Self Care Home Management;Cryotherapy;Electrical Stimulation;Iontophoresis 4mg /ml Dexamethasone;Moist Heat;Traction;Ultrasound;Gait training;Therapeutic activities;Therapeutic exercise;Balance training;Neuromuscular re-education;Manual techniques;Dry needling;Passive range of motion;Taping;Patient/family education    PT Next Visit Plan  review/ update HEP: STW along bil upper trap/ levator scapuale, sub-occipital  release, posterior shoulder strengthening, posture enducation/ handout    PT Home Exercise Plan  [id number] - supine chin tuck, upper trap/ levator scapulae stretch, scapular retraction.[id number]: open books, pec stretch in doorway , thoracic extension over towel roll,  green band row    Consulted and Agree with Plan of Care  Patient       Patient will benefit from skilled therapeutic intervention in order to improve the following deficits and impairments:  Improper body mechanics, Increased muscle spasms, Pain, Postural dysfunction, Decreased activity tolerance, Decreased range of motion  Visit Diagnosis: Cervicalgia  Other muscle spasm  Abnormal posture     Problem List Patient Active Problem List   Diagnosis Date Noted  . Hypertension   . Obstructive sleep apnea 05/09/2013  . Obesity (BMI 30-39.9) 05/09/2013  . Gallstones 02/15/2012   Starr Lake PT, DPT, LAT, ATC  10/11/19  11:53 AM      [facility] [address] [place], [place], [address] Phone: [phone]   Fax:  [phone]  Name: AMIRRAH SAVARESE MRN: [id number] Date of Birth: 12/30/1940

## 2019-10-14 ENCOUNTER — Other Ambulatory Visit: Payer: Self-pay

## 2019-10-14 ENCOUNTER — Ambulatory Visit: Payer: Medicare Other | Admitting: Physical Therapy

## 2019-10-14 DIAGNOSIS — M62838 Other muscle spasm: Secondary | ICD-10-CM | POA: Diagnosis not present

## 2019-10-14 DIAGNOSIS — M542 Cervicalgia: Secondary | ICD-10-CM | POA: Diagnosis not present

## 2019-10-14 DIAGNOSIS — R293 Abnormal posture: Secondary | ICD-10-CM | POA: Diagnosis not present

## 2019-10-14 NOTE — Therapy (Signed)
[place], [place], [address] Phone: [phone]   Fax:  [phone]  Physical Therapy Treatment  Patient Details  Name: Mackenzie King MRN: [id number] Date of Birth: 01/25/1941 Referring Provider (PT): [name], MD    Encounter Date: 10/14/2019  PT End of Session - 10/14/19 1146    Visit Number  5    Number of Visits  13    Date for PT Re-Evaluation  11/04/19    Progress Note Due on Visit  10    PT Start Time  1147    PT Stop Time  1227    PT Time Calculation (min)  40 min    Equipment Utilized During Treatment  --   parallel bars   Activity Tolerance  Patient tolerated treatment well    Behavior During Therapy  [facility] for tasks assessed/performed       Past Medical History:  Diagnosis Date  . Abdominal distention   . Asthma   . Asymptomatic gallstones   . 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 CKD (chronic kidney disease), stage III    Dr Florene Glen  . Colon polyps    hyperplastic  . Dermatitis    intermittent Dr Shelia Media  . Dyslipidemia   . Facet arthropathy, cervical   . GERD (gastroesophageal reflux disease)   . Hearing loss   . Hypertension   . Nasal congestion   . Obstructive sleep apnea   . Osteoarthritis   . Osteopenia    mild  . Plantar fasciitis    s/p shock tx Dr Paulla Dolly  . Pure hypercholesterolemia   . Restless legs syndrome (RLS)   . Wheezing     Past Surgical History:  Procedure Laterality Date  . BREAST SURGERY  1996   breast biopsy - benign calcifications  . BREAST SURGERY  1977   adenofibroma removed - 3 biopsy's  . CHOLECYSTECTOMY  03/19/2012   Procedure: LAPAROSCOPIC CHOLECYSTECTOMY WITH INTRAOPERATIVE CHOLANGIOGRAM;  Surgeon: Adin Hector, MD;  Location: [facility];  Service: General;  Laterality: N/A;  . CHOLECYSTECTOMY  03/19/2012   Procedure: CHOLECYSTECTOMY;  Surgeon: Adin Hector, MD;  Location: [facility];  Service: General;  Laterality: N/A;   converted to open @ 1137  . EYE SURGERY  02/2012   bilateral cataracts with lens implants  . FOOT NEUROMA SURGERY     right foot  . LIPOMA EXCISION    . 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 TUBAL LIGATION  1971    There were no vitals filed for this visit.  Subjective Assessment - 10/14/19 1150    Subjective  " I feel pretty good. I had one time where I felt like that crick was coming back in my neck but I did my exercises and it helped"    Currently in Pain?  Yes    Pain Score  0-No pain    Pain Type  Chronic pain    Pain Onset  More than a month ago         [facility] PT Assessment - 10/14/19 0001      Assessment   Medical Diagnosis  Other muscle spasm [id number]    Referring Provider (PT)  [name], MD                     [facility] Adult PT Treatment/Exercise - 10/14/19 0001  Neck Exercises: Machines for Strengthening   UBE (Upper Arm Bike)  L2 x 4 min   fwd/ bwd x 2 min     Neck Exercises: Theraband   Shoulder Extension  20 reps;Blue    Rows  Blue;20 reps    Shoulder External Rotation  Blue;15 reps    Shoulder Internal Rotation  15 reps;Blue   bil     Neck Exercises: Stretches   Upper Trapezius Stretch  30 seconds;1 rep;Left;Right    Levator Stretch  1 rep;Left;Right;30 seconds          Balance Exercises - 10/14/19 1233      Balance Exercises: Standing   Standing Eyes Opened  Narrow base of support (BOS);Foam/compliant surface;2 reps;30 secs    Standing Eyes Closed  Foam/compliant surface;2 reps;30 secs;Narrow base of support (BOS)    Tandem Stance  Eyes closed;2 reps;30 secs   in //   Rockerboard  Anterior/posterior;EO   to promote ankle strategy   Gait with Head Turns  Forward;4 reps    Tandem Gait  Forward;Retro;4 reps   in //   Marching  Foam/compliant surface;Static;10 reps   x 2 sets in // cues for control avoid stomping         PT Short Term Goals - 09/23/19 1300      PT SHORT  TERM GOAL #1   Title  pt to be I with initial HEP    Time  3    Period  Weeks    Status  New    Target Date  10/14/19        PT Long Term Goals - 09/23/19 1300      PT LONG TERM GOAL #1   Title  pt to be able to verbalize/ demo efficient posture and lifting mechanics to reduce and prevent neck pain/ stiffness    Time  6    Period  Weeks    Status  New    Target Date  11/04/19      PT LONG TERM GOAL #2   Title  increase neck rotatoin/ sidebending by >/= 6 degrees with </= 1/10 pain for functional ROM required for driving and ADLS    Time  6    Period  Weeks    Status  New    Target Date  11/04/19      PT LONG TERM GOAL #3   Title  pt to be able to do standing ADLs at home maintaining efficient posture / positioning with </= 1/10 pain in the mid back for QOL    Time  6    Period  Weeks    Status  New    Target Date  11/04/19      PT LONG TERM GOAL #4   Title  increase FOTO score to </= 40% limited to demo improvement in function    Time  6    Period  Weeks    Status  New    Target Date  11/04/19      PT LONG TERM GOAL #5   Title  pt to be I with all HEP given to maintain and progress current level of function    Time  6    Period  Weeks    Status  New    Target Date  11/04/19            Plan - 10/14/19 1236    Clinical Impression Statement  continued working on posterior shoulder strengthening and upper trap/  levator scapuale stretching. She did well with balance training in // focusing on ankle balance strategies which she did well with intermittent cues, and tandem walking forward /backward.    PT Treatment/Interventions  ADLs/Self Care Home Management;Cryotherapy;Electrical Stimulation;Iontophoresis 4mg /ml Dexamethasone;Moist Heat;Traction;Ultrasound;Gait training;Therapeutic activities;Therapeutic exercise;Balance training;Neuromuscular re-education;Manual techniques;Dry needling;Passive range of motion;Taping;Patient/family education    PT Next Visit Plan   review/ update HEP: STW PRN, posterior shoulder strengthening, static/ dynamic balance and reactive activities.    PT Home Exercise Plan  [id number] - supine chin tuck, upper trap/ levator scapulae stretch, scapular retraction.[id number]: open books, pec stretch in doorway , thoracic extension over towel roll, green band row       Patient will benefit from skilled therapeutic intervention in order to improve the following deficits and impairments:     Visit Diagnosis: Cervicalgia  Other muscle spasm  Abnormal posture     Problem List Patient Active Problem List   Diagnosis Date Noted  . Hypertension   . Obstructive sleep apnea 05/09/2013  . Obesity (BMI 30-39.9) 05/09/2013  . Gallstones 02/15/2012    Starr Lake PT, DPT, LAT, ATC  10/14/19  12:44 PM      [facility] [address] [place], [place], [address] Phone: [phone]   Fax:  [phone]  Name: Mackenzie King MRN: [id number] Date of Birth: 01/28/1941

## 2019-10-16 ENCOUNTER — Ambulatory Visit: Payer: Medicare Other | Admitting: Physical Therapy

## 2019-10-16 ENCOUNTER — Other Ambulatory Visit: Payer: Self-pay

## 2019-10-16 ENCOUNTER — Encounter: Payer: Self-pay | Admitting: Physical Therapy

## 2019-10-16 DIAGNOSIS — M62838 Other muscle spasm: Secondary | ICD-10-CM | POA: Diagnosis not present

## 2019-10-16 DIAGNOSIS — M542 Cervicalgia: Secondary | ICD-10-CM | POA: Diagnosis not present

## 2019-10-16 DIAGNOSIS — R293 Abnormal posture: Secondary | ICD-10-CM

## 2019-10-16 NOTE — Therapy (Signed)
[place], [place], [address] Phone: [phone]   Fax:  [phone]  Physical Therapy Treatment  Patient Details  Name: Mackenzie King MRN: [id number] Date of Birth: 11-08-1940 Referring Provider (PT): [name], MD    Encounter Date: 10/16/2019  PT End of Session - 10/16/19 1306    Visit Number  6    Number of Visits  13    Date for PT Re-Evaluation  11/04/19    Authorization Type  MCR: Kx mod at 15th visit    PT Start Time  1230    PT Stop Time  1315    PT Time Calculation (min)  45 min       Past Medical History:  Diagnosis Date  . Abdominal distention   . Asthma   . Asymptomatic gallstones   . 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 CKD (chronic kidney disease), stage III    Dr Florene Glen  . Colon polyps    hyperplastic  . Dermatitis    intermittent Dr Shelia Media  . Dyslipidemia   . Facet arthropathy, cervical   . GERD (gastroesophageal reflux disease)   . Hearing loss   . Hypertension   . Nasal congestion   . Obstructive sleep apnea   . Osteoarthritis   . Osteopenia    mild  . Plantar fasciitis    s/p shock tx Dr Paulla Dolly  . Pure hypercholesterolemia   . Restless legs syndrome (RLS)   . Wheezing     Past Surgical History:  Procedure Laterality Date  . BREAST SURGERY  1996   breast biopsy - benign calcifications  . BREAST SURGERY  1977   adenofibroma removed - 3 biopsy's  . CHOLECYSTECTOMY  03/19/2012   Procedure: LAPAROSCOPIC CHOLECYSTECTOMY WITH INTRAOPERATIVE CHOLANGIOGRAM;  Surgeon: Adin Hector, MD;  Location: [facility];  Service: General;  Laterality: N/A;  . CHOLECYSTECTOMY  03/19/2012   Procedure: CHOLECYSTECTOMY;  Surgeon: Adin Hector, MD;  Location: [facility];  Service: General;  Laterality: N/A;  converted to open @ 1137  . EYE SURGERY  02/2012   bilateral cataracts with lens implants  . FOOT NEUROMA SURGERY     right foot  . LIPOMA EXCISION    .  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 TUBAL LIGATION  1971    There were no vitals filed for this visit.  Subjective Assessment - 10/16/19 1306    Subjective  No pain today.    Currently in Pain?  No/denies                        [facility] Adult PT Treatment/Exercise - 10/16/19 0001      Neck Exercises: Machines for Strengthening   UBE (Upper Arm Bike)  L2 x 4 min   fwd/ bwd x 2 min     Neck Exercises: Theraband   Shoulder Extension  20 reps;Blue    Rows  Blue;20 reps    Shoulder External Rotation  15 reps;Green    Shoulder External Rotation Limitations  bilat    Shoulder Internal Rotation  15 reps;Blue   bil     Neck Exercises: Seated   Postural Training  seated chin tucks, scap retractions     Other Seated Exercise  horizontal abduction  x 20 red       Neck Exercises: Stretches   Upper Trapezius  Stretch  30 seconds;1 rep;Left;Right    Levator Stretch  1 rep;Left;Right;30 seconds          Balance Exercises - 10/16/19 1414      Balance Exercises: Standing   Standing Eyes Opened  Narrow base of support (BOS);Foam/compliant surface;2 reps;30 secs   added head turns   Standing Eyes Closed  Foam/compliant surface;2 reps;30 secs;Narrow base of support (BOS)    Tandem Stance  Eyes closed;2 reps;30 secs   in //, added head turns   Rockerboard  Anterior/posterior;EO   to promote ankle strategy, with and without UE   Tandem Gait  Forward;Retro;4 reps   in //   Marching  Foam/compliant surface;Static;10 reps   x 2 sets in // cues for control avoid stomping         PT Short Term Goals - 10/16/19 1307      PT SHORT TERM GOAL #1   Title  pt to be I with initial HEP    Baseline  compliant    Time  3    Period  Weeks    Status  Achieved        PT Long Term Goals - 09/23/19 1300      PT LONG TERM GOAL #1   Title  pt to be able to verbalize/ demo efficient posture and lifting mechanics to reduce and  prevent neck pain/ stiffness    Time  6    Period  Weeks    Status  New    Target Date  11/04/19      PT LONG TERM GOAL #2   Title  increase neck rotatoin/ sidebending by >/= 6 degrees with </= 1/10 pain for functional ROM required for driving and ADLS    Time  6    Period  Weeks    Status  New    Target Date  11/04/19      PT LONG TERM GOAL #3   Title  pt to be able to do standing ADLs at home maintaining efficient posture / positioning with </= 1/10 pain in the mid back for QOL    Time  6    Period  Weeks    Status  New    Target Date  11/04/19      PT LONG TERM GOAL #4   Title  increase FOTO score to </= 40% limited to demo improvement in function    Time  6    Period  Weeks    Status  New    Target Date  11/04/19      PT LONG TERM GOAL #5   Title  pt to be I with all HEP given to maintain and progress current level of function    Time  6    Period  Weeks    Status  New    Target Date  11/04/19            Plan - 10/16/19 1412    Clinical Impression Statement  Continued with balance training and scapular stabilization as well as neck stretching. She reports pain with neck motions is more intermittent.    PT Next Visit Plan  review/ update HEP: STW PRN, posterior shoulder strengthening, static/ dynamic balance and reactive activities.    PT Home Exercise Plan  [id number] - supine chin tuck, upper trap/ levator scapulae stretch, scapular retraction.[id number]: open books, pec stretch in doorway , thoracic extension over towel roll, green band row       Patient  will benefit from skilled therapeutic intervention in order to improve the following deficits and impairments:  Improper body mechanics, Increased muscle spasms, Pain, Postural dysfunction, Decreased activity tolerance, Decreased range of motion  Visit Diagnosis: Cervicalgia  Other muscle spasm  Abnormal posture     Problem List Patient Active Problem List   Diagnosis Date Noted  . Hypertension   .  Obstructive sleep apnea 05/09/2013  . Obesity (BMI 30-39.9) 05/09/2013  . Gallstones 02/15/2012    Dorene Ar, PTA 10/16/2019, 2:15 PM  [facility] [address] [place], [place], [address] Phone: [phone]   Fax:  [phone]  Name: Mackenzie King MRN: [id number] Date of Birth: 1941-03-26

## 2019-10-23 ENCOUNTER — Other Ambulatory Visit: Payer: Self-pay

## 2019-10-23 ENCOUNTER — Ambulatory Visit: Payer: Medicare Other | Attending: Family Medicine | Admitting: Physical Therapy

## 2019-10-23 ENCOUNTER — Encounter: Payer: Self-pay | Admitting: Physical Therapy

## 2019-10-23 DIAGNOSIS — M62838 Other muscle spasm: Secondary | ICD-10-CM | POA: Insufficient documentation

## 2019-10-23 DIAGNOSIS — R293 Abnormal posture: Secondary | ICD-10-CM | POA: Diagnosis present

## 2019-10-23 DIAGNOSIS — M542 Cervicalgia: Secondary | ICD-10-CM | POA: Insufficient documentation

## 2019-10-23 NOTE — Therapy (Signed)
[place], [place], [address] Phone: [phone]   Fax:  [phone]  Physical Therapy Treatment  Patient Details  Name: Mackenzie King MRN: [id number] Date of Birth: 1940/10/20 Referring Provider (PT): [name], MD    Encounter Date: 10/23/2019  PT End of Session - 10/23/19 1319    Visit Number  7    Number of Visits  13    Date for PT Re-Evaluation  11/04/19    Authorization Type  MCR: Kx mod at 15th visit    PT Start Time  80    PT Stop Time  1402    PT Time Calculation (min)  47 min       Past Medical History:  Diagnosis Date   Abdominal distention    Asthma    Asymptomatic gallstones    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)   CKD (chronic kidney disease), stage III    Dr Florene Glen   Colon polyps    hyperplastic   Dermatitis    intermittent Dr Shelia Media   Dyslipidemia    Facet arthropathy, cervical    GERD (gastroesophageal reflux disease)    Hearing loss    Hypertension    Nasal congestion    Obstructive sleep apnea    Osteoarthritis    Osteopenia    mild   Plantar fasciitis    s/p shock tx Dr Paulla Dolly   Pure hypercholesterolemia    Restless legs syndrome (RLS)    Wheezing     Past Surgical History:  Procedure Laterality Date   BREAST SURGERY  1996   breast biopsy - benign calcifications   BREAST SURGERY  1977   adenofibroma removed - 3 biopsy's   CHOLECYSTECTOMY  03/19/2012   Procedure: LAPAROSCOPIC CHOLECYSTECTOMY WITH INTRAOPERATIVE CHOLANGIOGRAM;  Surgeon: Adin Hector, MD;  Location: [facility];  Service: General;  Laterality: N/A;   CHOLECYSTECTOMY  03/19/2012   Procedure: CHOLECYSTECTOMY;  Surgeon: Adin Hector, MD;  Location: [facility];  Service: General;  Laterality: N/A;  converted to open @ [place]  02/2012   bilateral cataracts with lens implants   FOOT NEUROMA SURGERY     right foot   LIPOMA EXCISION     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  Subjective Assessment - 10/23/19 1318    Subjective  No pain. I haven't been doing my exercises this week.    Currently in Pain?  No/denies         [facility] PT Assessment - 10/23/19 0001      AROM   Cervical - Right Side Bend  26    Cervical - Left Side Bend  26    Cervical - Right Rotation  55    Cervical - Left Rotation  55                    [facility] Adult PT Treatment/Exercise - 10/23/19 0001      Self-Care   Self-Care  Lifting;Posture    Lifting  Reviewed lifting mechanics and standing spinal alignment for ADLs lke washing dishes , laundry     Posture  reviewed spinal slignement with sitting and standing       Neck Exercises: Machines for Strengthening   UBE (Upper Arm Bike)  L2 x  4 min   fwd/ bwd x 2 min     Neck Exercises: Theraband   Shoulder Extension  20 reps;Blue    Rows  Blue;20 reps    Shoulder External Rotation  15 reps;Green    Shoulder External Rotation Limitations  bilat    Shoulder Internal Rotation  15 reps;Blue   bil     Neck Exercises: Seated   Postural Training  seated chin tucks, scap retractions     Other Seated Exercise  horizontal abduction  x 20 red       Neck Exercises: Stretches   Upper Trapezius Stretch  30 seconds;1 rep;Left;Right    Levator Stretch  1 rep;Left;Right;30 seconds          Balance Exercises - 10/23/19 1348      Balance Exercises: Standing   Standing Eyes Opened  Narrow base of support (BOS);Foam/compliant surface;2 reps;30 secs   added head turns   Standing Eyes Closed  Foam/compliant surface;2 reps;30 secs;Narrow base of support (BOS)    Tandem Stance  Eyes closed;2 reps;30 secs   in //, added head turns   Rockerboard  Anterior/posterior;EO   to promote ankle strategy, with and without UE   Tandem Gait  Forward;Retro;4 reps   in //         PT Short Term  Goals - 10/16/19 1307      PT SHORT TERM GOAL #1   Title  pt to be I with initial HEP    Baseline  compliant    Time  3    Period  Weeks    Status  Achieved        PT Long Term Goals - 10/23/19 1354      PT LONG TERM GOAL #1   Title  pt to be able to verbalize/ demo efficient posture and lifting mechanics to reduce and prevent neck pain/ stiffness    Time  6    Period  Weeks    Status  On-going      PT LONG TERM GOAL #2   Title  increase neck rotatoin/ sidebending by >/= 6 degrees with </= 1/10 pain for functional ROM required for driving and ADLS    Time  6    Period  Weeks    Status  Partially Met      PT LONG TERM GOAL #3   Title  pt to be able to do standing ADLs at home maintaining efficient posture / positioning with </= 1/10 pain in the mid back for QOL    Time  6    Period  Weeks    Status  Achieved      PT LONG TERM GOAL #4   Title  increase FOTO score to </= 40% limited to demo improvement in function    Time  6    Period  Weeks    Status  Unable to assess      PT LONG TERM GOAL #5   Title  pt to be I with all HEP given to maintain and progress current level of function    Time  6    Period  Weeks    Status  On-going            Plan - 10/23/19 1349    Clinical Impression Statement  Pt reports significant improvement in neck pain. Cervical AROM improved for side bend and rotation.  She would like to use the rest of her visits to concentrate on her balance LTG#  3 met. LTG# 2 partially met.    PT Next Visit Plan  focus on balance , review neck HEP    PT Home Exercise Plan  [id number] - supine chin tuck, upper trap/ levator scapulae stretch, scapular retraction.[id number]: open books, pec stretch in doorway , thoracic extension over towel roll, green band row       Patient will benefit from skilled therapeutic intervention in order to improve the following deficits and impairments:  Improper body mechanics, Increased muscle spasms, Pain, Postural  dysfunction, Decreased activity tolerance, Decreased range of motion  Visit Diagnosis: Cervicalgia  Other muscle spasm  Abnormal posture     Problem List Patient Active Problem List   Diagnosis Date Noted   Hypertension    Obstructive sleep apnea 05/09/2013   Obesity (BMI 30-39.9) 05/09/2013   Gallstones 02/15/2012    Dorene Ar, PTA 10/23/2019, 2:06 PM  [facility] [address] [place], [place], [address] Phone: [phone]   Fax:  [phone]  Name: MYCHAELA LENNARTZ MRN: [id number] Date of Birth: 1940/07/25

## 2019-10-25 ENCOUNTER — Ambulatory Visit: Payer: Medicare Other | Admitting: Physical Therapy

## 2019-10-25 ENCOUNTER — Encounter: Payer: Self-pay | Admitting: Physical Therapy

## 2019-10-25 ENCOUNTER — Other Ambulatory Visit: Payer: Self-pay

## 2019-10-25 DIAGNOSIS — R293 Abnormal posture: Secondary | ICD-10-CM

## 2019-10-25 DIAGNOSIS — M542 Cervicalgia: Secondary | ICD-10-CM

## 2019-10-25 DIAGNOSIS — M62838 Other muscle spasm: Secondary | ICD-10-CM

## 2019-10-25 NOTE — Therapy (Addendum)
[facility], [place], [address] Phone: [phone]   Fax:  [phone]  Physical Therapy Treatment / Discharge  Patient Details  Name: Mackenzie King MRN: [id number] Date of Birth: April 04, 1941 Referring Provider (PT): [name], MD    Encounter Date: 10/25/2019  PT End of Session - 10/25/19 1149    Visit Number  8    Number of Visits  13    Date for PT Re-Evaluation  11/04/19    Authorization Type  MCR: Kx mod at 15th visit    PT Start Time  3903    PT Stop Time  1227    PT Time Calculation (min)  42 min       Past Medical History:  Diagnosis Date  . Abdominal distention   . Asthma   . Asymptomatic gallstones   . 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 CKD (chronic kidney disease), stage III    Dr Florene Glen  . Colon polyps    hyperplastic  . Dermatitis    intermittent Dr Shelia Media  . Dyslipidemia   . Facet arthropathy, cervical   . GERD (gastroesophageal reflux disease)   . Hearing loss   . Hypertension   . Nasal congestion   . Obstructive sleep apnea   . Osteoarthritis   . Osteopenia    mild  . Plantar fasciitis    s/p shock tx Dr Paulla Dolly  . Pure hypercholesterolemia   . Restless legs syndrome (RLS)   . Wheezing     Past Surgical History:  Procedure Laterality Date  . BREAST SURGERY  1996   breast biopsy - benign calcifications  . BREAST SURGERY  1977   adenofibroma removed - 3 biopsy's  . CHOLECYSTECTOMY  03/19/2012   Procedure: LAPAROSCOPIC CHOLECYSTECTOMY WITH INTRAOPERATIVE CHOLANGIOGRAM;  Surgeon: Adin Hector, MD;  Location: [facility];  Service: General;  Laterality: N/A;  . CHOLECYSTECTOMY  03/19/2012   Procedure: CHOLECYSTECTOMY;  Surgeon: Adin Hector, MD;  Location: [facility];  Service: General;  Laterality: N/A;  converted to open @ 1137  . EYE SURGERY  02/2012   bilateral cataracts with lens implants  . FOOT NEUROMA SURGERY     right foot  . LIPOMA EXCISION    .  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 TUBAL LIGATION  1971    There were no vitals filed for this visit.  Subjective Assessment - 10/25/19 1148    Subjective  No pain. It only hurts when I am trying to turn my head as far as I can when I am stretching.    Currently in Pain?  No/denies         [facility] PT Assessment - 10/25/19 0001      Standardized Balance Assessment   Standardized Balance Assessment  Berg Balance Test;Dynamic Gait Index      Berg Balance Test   Sit to Stand  Able to stand without using hands and stabilize independently    Standing Unsupported  Able to stand safely 2 minutes    Sitting with Back Unsupported but Feet Supported on Floor or Stool  Able to sit safely and securely 2 minutes    Stand to Sit  Sits safely with minimal use of hands    Transfers  Able to transfer safely, minor use of hands    Standing Unsupported with Eyes Closed  Able to stand 10 seconds  safely    Standing Unsupported with Feet Together  Able to place feet together independently and stand 1 minute safely    From Standing, Reach Forward with Outstretched Arm  Can reach confidently >25 cm (10")    From Standing Position, Pick up Object from Floor  Able to pick up shoe safely and easily    From Standing Position, Turn to Look Behind Over each Shoulder  Looks behind from both sides and weight shifts well    Turn 360 Degrees  Able to turn 360 degrees safely but slowly    Standing Unsupported, Alternately Place Feet on Step/Stool  Able to stand independently and safely and complete 8 steps in 20 seconds    Standing Unsupported, One Foot in Front  Able to place foot tandem independently and hold 30 seconds    Standing on One Leg  Able to lift leg independently and hold > 10 seconds    Total Score  54    Berg comment:  54/56      Dynamic Gait Index   Level Surface  Normal    Change in Gait Speed  Normal    Gait with Horizontal Head Turns  Normal    Gait  with Vertical Head Turns  Mild Impairment    Gait and Pivot Turn  Mild Impairment    Step Over Obstacle  Normal    Step Around Obstacles  Normal    Steps  Normal    Total Score  22    DGI comment:  22/24                      [facility] Adult PT Treatment/Exercise - 10/25/19 0001      Neck Exercises: Machines for Strengthening   UBE (Upper Arm Bike)  L2 x 4 min   fwd/ bwd x 2 min     Neck Exercises: Theraband   Shoulder Extension  20 reps;Blue    Rows  Blue;20 reps    Shoulder External Rotation  20 reps;Green    Shoulder External Rotation Limitations  bilat    Shoulder Internal Rotation  15 reps;Blue   bil     Neck Exercises: Seated   Postural Training  seated chin tucks, scap retractions       Neck Exercises: Stretches   Upper Trapezius Stretch  2 reps;20 seconds    Levator Stretch  2 reps;20 seconds               PT Short Term Goals - 10/16/19 1307      PT SHORT TERM GOAL #1   Title  pt to be I with initial HEP    Baseline  compliant    Time  3    Period  Weeks    Status  Achieved        PT Long Term Goals - 10/25/19 1238      PT LONG TERM GOAL #1   Title  pt to be able to verbalize/ demo efficient posture and lifting mechanics to reduce and prevent neck pain/ stiffness    Time  6    Period  Weeks    Status  Achieved      PT LONG TERM GOAL #2   Title  increase neck rotatoin/ sidebending by >/= 6 degrees with </= 1/10 pain for functional ROM required for driving and ADLS    Time  6    Period  Weeks    Status  Partially Met  PT LONG TERM GOAL #3   Title  pt to be able to do standing ADLs at home maintaining efficient posture / positioning with </= 1/10 pain in the mid back for QOL    Time  6    Period  Weeks    Status  Achieved      PT LONG TERM GOAL #4   Title  increase FOTO score to </= 40% limited to demo improvement in function    Time  6    Period  Weeks    Status  Achieved      PT LONG TERM GOAL #5   Title  pt to be I  with all HEP given to maintain and progress current level of function    Time  6    Period  Weeks    Status  Achieved            Plan - 10/25/19 1237    Clinical Impression Statement  Pt continues without neck pain and only reports a little stiffness when she first starts her neck stretches at home. Her DGI score today is 22/24 and Berg 54/56. She has met most LTGS and is agreeable with discharge to HEP.    PT Next Visit Plan  discharge to [facility] - supine chin tuck, upper trap/ levator scapulae stretch, scapular retraction.[id number]: open books, pec stretch in doorway , thoracic extension over towel roll, green band row       Patient will benefit from skilled therapeutic intervention in order to improve the following deficits and impairments:  Improper body mechanics, Increased muscle spasms, Pain, Postural dysfunction, Decreased activity tolerance, Decreased range of motion  Visit Diagnosis: Cervicalgia  Abnormal posture  Other muscle spasm     Problem List Patient Active Problem List   Diagnosis Date Noted  . Hypertension   . Obstructive sleep apnea 05/09/2013  . Obesity (BMI 30-39.9) 05/09/2013  . Gallstones 02/15/2012    Dorene Ar, PTA 10/25/2019, 12:41 PM  [facility] [address] [place], [place], [address] Phone: [phone]   Fax:  [phone]  Name: PSALMS OLARTE MRN: [id number] Date of Birth: 1940/08/01      PHYSICAL THERAPY DISCHARGE SUMMARY  Visits from Start of Care: 8  Current functional level related to goals / functional outcomes: See goals, see BERG and DGI   Remaining deficits: See assessment   Education / Equipment: HEP, theraband, posture, balance  Plan: Patient agrees to discharge.  Patient goals were met. Patient is being discharged due to meeting the stated rehab goals.  ?????         Kristoffer Leamon PT, DPT, LAT, ATC   10/28/19  8:30 AM

## 2019-10-28 DIAGNOSIS — H18593 Other hereditary corneal dystrophies, bilateral: Secondary | ICD-10-CM | POA: Diagnosis not present

## 2019-10-28 DIAGNOSIS — H35362 Drusen (degenerative) of macula, left eye: Secondary | ICD-10-CM | POA: Diagnosis not present

## 2019-10-28 DIAGNOSIS — H04123 Dry eye syndrome of bilateral lacrimal glands: Secondary | ICD-10-CM | POA: Diagnosis not present

## 2019-10-28 DIAGNOSIS — Z961 Presence of intraocular lens: Secondary | ICD-10-CM | POA: Diagnosis not present

## 2019-10-29 ENCOUNTER — Ambulatory Visit: Payer: Medicare Other | Admitting: Physical Therapy

## 2019-10-31 ENCOUNTER — Encounter: Payer: Medicare Other | Admitting: Physical Therapy

## 2019-12-12 DIAGNOSIS — Z1231 Encounter for screening mammogram for malignant neoplasm of breast: Secondary | ICD-10-CM | POA: Diagnosis not present

## 2019-12-17 DIAGNOSIS — M8589 Other specified disorders of bone density and structure, multiple sites: Secondary | ICD-10-CM | POA: Diagnosis not present

## 2020-01-03 DIAGNOSIS — R921 Mammographic calcification found on diagnostic imaging of breast: Secondary | ICD-10-CM | POA: Diagnosis not present

## 2020-01-06 DIAGNOSIS — H18459 Nodular corneal degeneration, unspecified eye: Secondary | ICD-10-CM | POA: Diagnosis not present

## 2020-01-29 ENCOUNTER — Other Ambulatory Visit: Payer: Self-pay | Admitting: Radiology

## 2020-01-29 DIAGNOSIS — F324 Major depressive disorder, single episode, in partial remission: Secondary | ICD-10-CM | POA: Diagnosis not present

## 2020-01-29 DIAGNOSIS — E785 Hyperlipidemia, unspecified: Secondary | ICD-10-CM | POA: Diagnosis not present

## 2020-01-29 DIAGNOSIS — R921 Mammographic calcification found on diagnostic imaging of breast: Secondary | ICD-10-CM | POA: Diagnosis not present

## 2020-01-29 DIAGNOSIS — N183 Chronic kidney disease, stage 3 unspecified: Secondary | ICD-10-CM | POA: Diagnosis not present

## 2020-01-29 DIAGNOSIS — I129 Hypertensive chronic kidney disease with stage 1 through stage 4 chronic kidney disease, or unspecified chronic kidney disease: Secondary | ICD-10-CM | POA: Diagnosis not present

## 2020-01-29 DIAGNOSIS — N6091 Unspecified benign mammary dysplasia of right breast: Secondary | ICD-10-CM | POA: Diagnosis not present

## 2020-01-29 DIAGNOSIS — J4520 Mild intermittent asthma, uncomplicated: Secondary | ICD-10-CM | POA: Diagnosis not present

## 2020-02-07 DIAGNOSIS — H18459 Nodular corneal degeneration, unspecified eye: Secondary | ICD-10-CM | POA: Diagnosis not present

## 2020-02-17 ENCOUNTER — Ambulatory Visit: Payer: Self-pay | Admitting: Surgery

## 2020-02-17 DIAGNOSIS — N6091 Unspecified benign mammary dysplasia of right breast: Secondary | ICD-10-CM

## 2020-02-17 NOTE — H&P (Signed)
Mackenzie King Appointment: 02/17/2020 1:50 PM Location: [facility] Patient #: [id number] DOB: 1940-10-13 Widowed / Language: Mackenzie Molt / Race: White Female  History of Present Illness Mackenzie Moores A. [name] MD; 02/17/2020 3:08 PM) Patient words: Patient presents for evaluation of abnormal right breast mammogram. Her recent screening and subsequent diagnostic mammogram showed a 5 cm cluster of right breast calcifications upper outer quadrant. Core biopsy showed atypical ductal hyperplasia. She has a history of left breast biopsy back in the 1990s for benign disease. There is no family history of breast cancer and she has been screened yearly. The patient denies any history of breast pain, breast masses or nipple discharge.  The patient is a 79 year old female.   Past Surgical History (Mackenzie King, [place]; 02/17/2020 2:04 PM) Breast Biopsy multiple Colon Polyp Removal - Colonoscopy Foot Surgery Right.  Diagnostic Studies History (Mackenzie King, [place]; 02/17/2020 2:04 PM) Colonoscopy within last year Mammogram within last year  Allergies (Mackenzie King, [name]; 02/17/2020 2:05 PM) Sulfa Antibiotics Cipro *FLUOROQUINOLONES* Allergies Reconciled  Medication History (Mackenzie King, [name]; 02/17/2020 2:06 PM) Lisinopril (40MG  Tablet, Oral) Active. Furosemide (20MG  Tablet, Oral) Active. Pravastatin Sodium (40MG  Tablet, Oral) Active. Symbicort (160-4.5MCG/ACT Aerosol, Inhalation) Active. ProAir HFA (108 (90 Base)MCG/ACT Aerosol Soln, Inhalation) Active. Solifenacin Succinate (5MG  Tablet, Oral) Active. hydroCHLOROthiazide (12.5MG  Tablet, Oral) Active. Medications Reconciled  Social History Mackenzie King, [name]; 02/17/2020 2:04 PM) Alcohol use Occasional alcohol use. No caffeine use No drug use Tobacco use Never smoker.  Family History Mackenzie King, [name]; 02/17/2020 2:04 PM) Breast Cancer Mother. Hypertension Mother.  Pregnancy / Birth History Mackenzie King, [name];  02/17/2020 2:04 PM) Age at menarche 74 years. Age of menopause 5-60 Contraceptive History Oral contraceptives. Gravida 4 Maternal age 64-25  Other Problems Mackenzie King, [place]; 02/17/2020 2:04 PM) Arthritis Asthma Bladder Problems Hemorrhoids High blood pressure     Review of Systems (Mackenzie King [name]; 02/17/2020 2:04 PM) General Not Present- Appetite Loss, Chills, Fatigue, Fever, Night Sweats, Weight Gain and Weight Loss. Skin Not Present- Change in Wart/Mole, Dryness, Hives, Jaundice, New Lesions, Non-Healing Wounds, Rash and Ulcer. HEENT Present- Hearing Loss and Wears glasses/contact lenses. Not Present- Earache, Hoarseness, Nose Bleed, Oral Ulcers, Ringing in the Ears, Seasonal Allergies, Sinus Pain, Sore Throat, Visual Disturbances and Yellow Eyes. Cardiovascular Not Present- Chest Pain, Difficulty Breathing Lying Down, Leg Cramps, Palpitations, Rapid Heart Rate, Shortness of Breath and Swelling of Extremities. Gastrointestinal Not Present- Abdominal Pain, Bloating, Bloody Stool, Change in Bowel Habits, Chronic diarrhea, Constipation, Difficulty Swallowing, Excessive gas, Gets full quickly at meals, Hemorrhoids, Indigestion, Nausea, Rectal Pain and Vomiting. Female Genitourinary Present- Frequency. Not Present- Nocturia, Painful Urination, Pelvic Pain and Urgency. Musculoskeletal Not Present- Back Pain, Joint Pain, Joint Stiffness, Muscle Pain, Muscle Weakness and Swelling of Extremities. Neurological Not Present- Decreased Memory, Fainting, Headaches, Numbness, Seizures, Tingling, Tremor, Trouble walking and Weakness. Psychiatric Not Present- Anxiety, Bipolar, Change in Sleep Pattern, Depression, Fearful and Frequent crying. Endocrine Present- Hair Changes. Not Present- Cold Intolerance, Excessive Hunger, Heat Intolerance, Hot flashes and New Diabetes. Hematology Not Present- Blood Thinners, Easy Bruising, Excessive bleeding, Gland problems, HIV and Persistent  Infections.  Vitals (Mackenzie King [name]; 02/17/2020 2:06 PM) 02/17/2020 2:06 PM Weight: 172.25 lb Height: 62in Body Surface Area: 1.79 m Body Mass Index: 31.5 kg/m  Temp.: 54F  Pulse: 76 (Regular)  BP: 126/76(Sitting, Left Arm, Standard)        Physical Exam (Mackenzie Westra A. Reynard Christoffersen MD; 02/17/2020 3:08 PM)  General Mental Status-Alert. General Appearance-Consistent with stated age. Hydration-Well hydrated.  Voice-Normal.  Head and Neck Head-normocephalic, atraumatic with no lesions or palpable masses. Trachea-midline. Thyroid Gland Characteristics - normal size and consistency.  Breast Breast - Left-Symmetric, Non Tender, No Biopsy scars, no Dimpling - Left, No Inflammation, No Lumpectomy scars, No Mastectomy scars, No Peau d' Orange. Breast - Right-Symmetric, Non Tender, No Biopsy scars, no Dimpling - Right, No Inflammation, No Lumpectomy scars, No Mastectomy scars, No Peau d' Orange. Breast Lump-No Palpable Breast Mass.  Cardiovascular Cardiovascular examination reveals -normal heart sounds, regular rate and rhythm with no murmurs and normal pedal pulses bilaterally.  Neurologic Neurologic evaluation reveals -alert and oriented x 3 with no impairment of recent or remote memory. Mental Status-Normal.  Lymphatic Head & Neck  General Head & Neck Lymphatics: Bilateral - Description - Normal. Axillary  General Axillary Region: Bilateral - Description - Normal. Tenderness - Non Tender.    Assessment & Plan (Mackenzie Elmore A. Araly Kaas MD; 02/17/2020 3:09 PM)  ATYPICAL DUCTAL HYPERPLASIA OF RIGHT BREAST (N60.91) Impression: Discussed seed localized right breast lumpectomy for atypical ductal hyperplasia and the potential risk of up to 20%. Observation was discussed. She is opted to undergo a right breast seed localized lumpectomy Risk of lumpectomy include bleeding, infection, seroma, more surgery, use of seed/wire, wound care, cosmetic deformity and  the need for other treatments, death , blood clots, death. Pt agrees to proceed.  Current Plans Pt Education - [facility] Education - Written Instructions given: discussed with patient and provided information. Pt Education - [facility] General Post-op HCI You are being scheduled for surgery- Our schedulers will call you.  You should hear from our office's scheduling department within 5 working days about the location, date, and time of surgery. We try to make accommodations for patient's preferences in scheduling surgery, but sometimes the OR schedule or the surgeon's schedule prevents [place] from making those accommodations.  If you have not heard from our office [phone]) in 5 working days, call the office and ask for your surgeon's nurse.  If you have other questions about your diagnosis, plan, or surgery, call the office and ask for your surgeon's nurse.  Pt Education - Pamphlet Given - Breast Biopsy: discussed with patient and provided information.

## 2020-02-19 DIAGNOSIS — H18459 Nodular corneal degeneration, unspecified eye: Secondary | ICD-10-CM | POA: Diagnosis not present

## 2020-02-25 DIAGNOSIS — E785 Hyperlipidemia, unspecified: Secondary | ICD-10-CM | POA: Diagnosis not present

## 2020-02-25 DIAGNOSIS — N183 Chronic kidney disease, stage 3 unspecified: Secondary | ICD-10-CM | POA: Diagnosis not present

## 2020-02-25 DIAGNOSIS — I129 Hypertensive chronic kidney disease with stage 1 through stage 4 chronic kidney disease, or unspecified chronic kidney disease: Secondary | ICD-10-CM | POA: Diagnosis not present

## 2020-02-25 DIAGNOSIS — J4520 Mild intermittent asthma, uncomplicated: Secondary | ICD-10-CM | POA: Diagnosis not present

## 2020-02-25 DIAGNOSIS — F324 Major depressive disorder, single episode, in partial remission: Secondary | ICD-10-CM | POA: Diagnosis not present

## 2020-03-11 DIAGNOSIS — Z23 Encounter for immunization: Secondary | ICD-10-CM | POA: Diagnosis not present

## 2020-03-18 DIAGNOSIS — N3281 Overactive bladder: Secondary | ICD-10-CM | POA: Diagnosis not present

## 2020-03-18 DIAGNOSIS — R7303 Prediabetes: Secondary | ICD-10-CM | POA: Diagnosis not present

## 2020-03-18 DIAGNOSIS — N183 Chronic kidney disease, stage 3 unspecified: Secondary | ICD-10-CM | POA: Diagnosis not present

## 2020-03-18 DIAGNOSIS — Z0000 Encounter for general adult medical examination without abnormal findings: Secondary | ICD-10-CM | POA: Diagnosis not present

## 2020-03-18 DIAGNOSIS — E785 Hyperlipidemia, unspecified: Secondary | ICD-10-CM | POA: Diagnosis not present

## 2020-03-18 DIAGNOSIS — I129 Hypertensive chronic kidney disease with stage 1 through stage 4 chronic kidney disease, or unspecified chronic kidney disease: Secondary | ICD-10-CM | POA: Diagnosis not present

## 2020-03-20 ENCOUNTER — Encounter (HOSPITAL_BASED_OUTPATIENT_CLINIC_OR_DEPARTMENT_OTHER): Payer: Self-pay | Admitting: Surgery

## 2020-03-20 ENCOUNTER — Other Ambulatory Visit: Payer: Self-pay

## 2020-03-23 ENCOUNTER — Other Ambulatory Visit (HOSPITAL_COMMUNITY)
Admission: RE | Admit: 2020-03-23 | Discharge: 2020-03-23 | Disposition: A | Discharge status: Home / Self Care | Payer: Medicare Other | Source: Ambulatory Visit | Attending: Surgery | Admitting: Surgery

## 2020-03-23 ENCOUNTER — Encounter (HOSPITAL_BASED_OUTPATIENT_CLINIC_OR_DEPARTMENT_OTHER)
Admission: RE | Admit: 2020-03-23 | Discharge: 2020-03-23 | Disposition: A | Discharge status: Home / Self Care | Payer: Medicare Other | Source: Ambulatory Visit | Attending: Surgery | Admitting: Surgery

## 2020-03-23 DIAGNOSIS — Z20822 Contact with and (suspected) exposure to covid-19: Secondary | ICD-10-CM | POA: Insufficient documentation

## 2020-03-23 DIAGNOSIS — Z79899 Other long term (current) drug therapy: Secondary | ICD-10-CM | POA: Diagnosis not present

## 2020-03-23 DIAGNOSIS — N6091 Unspecified benign mammary dysplasia of right breast: Secondary | ICD-10-CM | POA: Insufficient documentation

## 2020-03-23 DIAGNOSIS — Z01818 Encounter for other preprocedural examination: Secondary | ICD-10-CM | POA: Insufficient documentation

## 2020-03-23 DIAGNOSIS — D0511 Intraductal carcinoma in situ of right breast: Secondary | ICD-10-CM | POA: Diagnosis not present

## 2020-03-23 DIAGNOSIS — J45909 Unspecified asthma, uncomplicated: Secondary | ICD-10-CM | POA: Diagnosis not present

## 2020-03-23 DIAGNOSIS — Z803 Family history of malignant neoplasm of breast: Secondary | ICD-10-CM | POA: Diagnosis not present

## 2020-03-23 DIAGNOSIS — I10 Essential (primary) hypertension: Secondary | ICD-10-CM | POA: Diagnosis not present

## 2020-03-23 DIAGNOSIS — Z882 Allergy status to sulfonamides status: Secondary | ICD-10-CM | POA: Diagnosis not present

## 2020-03-23 DIAGNOSIS — Z7951 Long term (current) use of inhaled steroids: Secondary | ICD-10-CM | POA: Diagnosis not present

## 2020-03-23 DIAGNOSIS — Z881 Allergy status to other antibiotic agents status: Secondary | ICD-10-CM | POA: Diagnosis not present

## 2020-03-23 DIAGNOSIS — Z8249 Family history of ischemic heart disease and other diseases of the circulatory system: Secondary | ICD-10-CM | POA: Diagnosis not present

## 2020-03-23 LAB — COMPREHENSIVE METABOLIC PANEL
ALT: 16 U/L (ref 0–44)
AST: 17 U/L (ref 15–41)
Albumin: 3.6 g/dL (ref 3.5–5.0)
Alkaline Phosphatase: 70 U/L (ref 38–126)
Anion gap: 7 (ref 5–15)
BUN: 12 mg/dL (ref 8–23)
CO2: 26 mmol/L (ref 22–32)
Calcium: 9.2 mg/dL (ref 8.9–10.3)
Chloride: 104 mmol/L (ref 98–111)
Creatinine, Ser: 1.01 mg/dL — ABNORMAL HIGH (ref 0.44–1.00)
GFR, Estimated: 56 mL/min — ABNORMAL LOW (ref >60)
Glucose, Bld: 89 mg/dL (ref 70–99)
Potassium: 4.6 mmol/L (ref 3.5–5.1)
Sodium: 137 mmol/L (ref 135–145)
Total Bilirubin: 0.6 mg/dL (ref 0.3–1.2)
Total Protein: 5.7 g/dL — ABNORMAL LOW (ref 6.5–8.1)

## 2020-03-23 LAB — CBC WITH DIFFERENTIAL/PLATELET
Abs Immature Granulocytes: 0.03 10*3/uL (ref 0.00–0.07)
Basophils Absolute: 0 10*3/uL (ref 0.0–0.1)
Basophils Relative: 1 %
Eosinophils Absolute: 0.3 10*3/uL (ref 0.0–0.5)
Eosinophils Relative: 4 %
HCT: 36.7 % (ref 36.0–46.0)
Hemoglobin: 12 g/dL (ref 12.0–15.0)
Immature Granulocytes: 0 %
Lymphocytes Relative: 33 %
Lymphs Abs: 2.7 10*3/uL (ref 0.7–4.0)
MCH: 30.8 pg (ref 26.0–34.0)
MCHC: 32.7 g/dL (ref 30.0–36.0)
MCV: 94.1 fL (ref 80.0–100.0)
Monocytes Absolute: 1 10*3/uL (ref 0.1–1.0)
Monocytes Relative: 12 %
Neutro Abs: 4.2 10*3/uL (ref 1.7–7.7)
Neutrophils Relative %: 50 %
Platelets: 342 10*3/uL (ref 150–400)
RBC: 3.9 MIL/uL (ref 3.87–5.11)
RDW: 12.9 % (ref 11.5–15.5)
WBC: 8.3 10*3/uL (ref 4.0–10.5)
nRBC: 0 % (ref 0.0–0.2)

## 2020-03-23 NOTE — Progress Notes (Signed)

## 2020-03-24 LAB — SARS CORONAVIRUS 2 (TAT 6-24 HRS): SARS Coronavirus 2: NEGATIVE

## 2020-03-25 DIAGNOSIS — R928 Other abnormal and inconclusive findings on diagnostic imaging of breast: Secondary | ICD-10-CM | POA: Diagnosis not present

## 2020-03-26 ENCOUNTER — Ambulatory Visit (HOSPITAL_BASED_OUTPATIENT_CLINIC_OR_DEPARTMENT_OTHER): Payer: Medicare Other | Admitting: Certified Registered"

## 2020-03-26 ENCOUNTER — Encounter (HOSPITAL_BASED_OUTPATIENT_CLINIC_OR_DEPARTMENT_OTHER): Payer: Self-pay | Admitting: Surgery

## 2020-03-26 ENCOUNTER — Encounter (HOSPITAL_BASED_OUTPATIENT_CLINIC_OR_DEPARTMENT_OTHER)
Admission: RE | Disposition: A | Discharge status: Home / Self Care | Payer: Self-pay | Source: Home / Self Care | Attending: Surgery

## 2020-03-26 ENCOUNTER — Other Ambulatory Visit: Payer: Self-pay

## 2020-03-26 ENCOUNTER — Ambulatory Visit (HOSPITAL_BASED_OUTPATIENT_CLINIC_OR_DEPARTMENT_OTHER)
Admission: RE | Admit: 2020-03-26 | Discharge: 2020-03-26 | Disposition: A | Discharge status: Home / Self Care | Payer: Medicare Other | Attending: Surgery | Admitting: Surgery

## 2020-03-26 DIAGNOSIS — J45909 Unspecified asthma, uncomplicated: Secondary | ICD-10-CM | POA: Insufficient documentation

## 2020-03-26 DIAGNOSIS — Z79899 Other long term (current) drug therapy: Secondary | ICD-10-CM | POA: Insufficient documentation

## 2020-03-26 DIAGNOSIS — Z881 Allergy status to other antibiotic agents status: Secondary | ICD-10-CM | POA: Diagnosis not present

## 2020-03-26 DIAGNOSIS — R921 Mammographic calcification found on diagnostic imaging of breast: Secondary | ICD-10-CM | POA: Diagnosis not present

## 2020-03-26 DIAGNOSIS — D0511 Intraductal carcinoma in situ of right breast: Secondary | ICD-10-CM | POA: Insufficient documentation

## 2020-03-26 DIAGNOSIS — N6489 Other specified disorders of breast: Secondary | ICD-10-CM | POA: Diagnosis not present

## 2020-03-26 DIAGNOSIS — N6091 Unspecified benign mammary dysplasia of right breast: Secondary | ICD-10-CM

## 2020-03-26 DIAGNOSIS — Z8249 Family history of ischemic heart disease and other diseases of the circulatory system: Secondary | ICD-10-CM | POA: Insufficient documentation

## 2020-03-26 DIAGNOSIS — N6081 Other benign mammary dysplasias of right breast: Secondary | ICD-10-CM | POA: Diagnosis not present

## 2020-03-26 DIAGNOSIS — Z882 Allergy status to sulfonamides status: Secondary | ICD-10-CM | POA: Insufficient documentation

## 2020-03-26 DIAGNOSIS — Z9989 Dependence on other enabling machines and devices: Secondary | ICD-10-CM | POA: Diagnosis not present

## 2020-03-26 DIAGNOSIS — I10 Essential (primary) hypertension: Secondary | ICD-10-CM | POA: Diagnosis not present

## 2020-03-26 DIAGNOSIS — Z803 Family history of malignant neoplasm of breast: Secondary | ICD-10-CM | POA: Diagnosis not present

## 2020-03-26 DIAGNOSIS — G4733 Obstructive sleep apnea (adult) (pediatric): Secondary | ICD-10-CM | POA: Diagnosis not present

## 2020-03-26 DIAGNOSIS — N641 Fat necrosis of breast: Secondary | ICD-10-CM | POA: Diagnosis not present

## 2020-03-26 DIAGNOSIS — Z7951 Long term (current) use of inhaled steroids: Secondary | ICD-10-CM | POA: Diagnosis not present

## 2020-03-26 HISTORY — PX: BREAST LUMPECTOMY WITH RADIOACTIVE SEED LOCALIZATION: SHX6424

## 2020-03-26 HISTORY — DX: Unspecified benign mammary dysplasia of right breast: N60.91

## 2020-03-26 SURGERY — BREAST LUMPECTOMY WITH RADIOACTIVE SEED LOCALIZATION
Anesthesia: General | Site: Breast | Laterality: Right

## 2020-03-26 MED ORDER — CHLORHEXIDINE GLUCONATE CLOTH 2 % EX PADS: 6.0000 | MEDICATED_PAD | Freq: Once | CUTANEOUS | Status: DC

## 2020-03-26 MED ORDER — LIDOCAINE 2% (20 MG/ML) 5 ML SYRINGE
INTRAMUSCULAR | Status: DC | PRN
  Administered 2020-03-26: 60 mg via INTRAVENOUS

## 2020-03-26 MED ORDER — OXYCODONE HCL 5 MG/5ML PO SOLN: 5.0000 mg | Freq: Once | ORAL | Status: DC | PRN

## 2020-03-26 MED ORDER — HYDROCODONE-ACETAMINOPHEN 5-325 MG PO TABS: 1.0000 | ORAL_TABLET | Freq: Four times a day (QID) | ORAL | 0 refills | Status: DC | PRN

## 2020-03-26 MED ORDER — LACTATED RINGERS IV SOLN: INTRAVENOUS | Status: DC

## 2020-03-26 MED ORDER — ONDANSETRON HCL 4 MG/2ML IJ SOLN
INTRAMUSCULAR | Status: DC | PRN
  Administered 2020-03-26: 4 mg via INTRAVENOUS

## 2020-03-26 MED ORDER — CEFAZOLIN SODIUM-DEXTROSE 2-4 GM/100ML-% IV SOLN
2.0000 g | INTRAVENOUS | Status: AC
  Administered 2020-03-26: 2 g via INTRAVENOUS

## 2020-03-26 MED ORDER — IBUPROFEN 800 MG PO TABS: 800.0000 mg | ORAL_TABLET | Freq: Three times a day (TID) | ORAL | 0 refills | Status: DC | PRN

## 2020-03-26 MED ORDER — BUPIVACAINE HCL 0.25 % IJ SOLN
INTRAMUSCULAR | Status: DC | PRN
  Administered 2020-03-26: 20 mL

## 2020-03-26 MED ORDER — FENTANYL CITRATE (PF) 100 MCG/2ML IJ SOLN
INTRAMUSCULAR | Status: DC | PRN
  Administered 2020-03-26 (×2): 25 ug via INTRAVENOUS

## 2020-03-26 MED ORDER — OXYCODONE HCL 5 MG PO TABS: 5.0000 mg | ORAL_TABLET | Freq: Once | ORAL | Status: DC | PRN

## 2020-03-26 MED ORDER — FENTANYL CITRATE (PF) 100 MCG/2ML IJ SOLN
INTRAMUSCULAR | Status: AC
  Filled 2020-03-26: qty 2

## 2020-03-26 MED ORDER — CEFAZOLIN SODIUM-DEXTROSE 2-4 GM/100ML-% IV SOLN
INTRAVENOUS | Status: AC
  Filled 2020-03-26: qty 100

## 2020-03-26 MED ORDER — PROPOFOL 10 MG/ML IV BOLUS
INTRAVENOUS | Status: DC | PRN
  Administered 2020-03-26: 150 mg via INTRAVENOUS

## 2020-03-26 MED ORDER — DEXAMETHASONE SODIUM PHOSPHATE 10 MG/ML IJ SOLN
INTRAMUSCULAR | Status: DC | PRN
  Administered 2020-03-26: 5 mg via INTRAVENOUS

## 2020-03-26 MED ORDER — AMISULPRIDE (ANTIEMETIC) 5 MG/2ML IV SOLN: 10.0000 mg | Freq: Once | INTRAVENOUS | Status: DC | PRN

## 2020-03-26 MED ORDER — HYDROMORPHONE HCL 1 MG/ML IJ SOLN: 0.2500 mg | INTRAMUSCULAR | Status: DC | PRN

## 2020-03-26 MED ORDER — PROMETHAZINE HCL 25 MG/ML IJ SOLN: 6.2500 mg | INTRAMUSCULAR | Status: DC | PRN

## 2020-03-26 SURGICAL SUPPLY — 35 items
ADH SKN CLS APL DERMABOND .7 (GAUZE/BANDAGES/DRESSINGS) ×1
APL PRP STRL LF DISP 70% ISPRP (MISCELLANEOUS) ×1
BINDER BREAST LRG (GAUZE/BANDAGES/DRESSINGS) ×3 IMPLANT
BLADE SURG 15 STRL LF DISP TIS (BLADE) ×1 IMPLANT
BLADE SURG 15 STRL SS (BLADE) ×3
CHLORAPREP W/TINT 26 (MISCELLANEOUS) ×3 IMPLANT
COVER BACK TABLE 60X90IN (DRAPES) ×3 IMPLANT
COVER MAYO STAND STRL (DRAPES) ×3 IMPLANT
COVER PROBE W GEL 5X96 (DRAPES) ×3 IMPLANT
DERMABOND ADVANCED (GAUZE/BANDAGES/DRESSINGS) ×2
DERMABOND ADVANCED .7 DNX12 (GAUZE/BANDAGES/DRESSINGS) ×1 IMPLANT
DRAPE LAPAROTOMY 100X72 PEDS (DRAPES) ×3 IMPLANT
DRAPE UTILITY XL STRL (DRAPES) ×3 IMPLANT
ELECT COATED BLADE 2.86 ST (ELECTRODE) ×3 IMPLANT
ELECT REM PT RETURN 9FT ADLT (ELECTROSURGICAL) ×3
ELECTRODE REM PT RTRN 9FT ADLT (ELECTROSURGICAL) ×1 IMPLANT
GLOVE BIO SURGEON STRL SZ 6.5 (GLOVE) ×2 IMPLANT
GLOVE BIO SURGEONS STRL SZ 6.5 (GLOVE) ×1
GLOVE BIOGEL PI IND STRL 8 (GLOVE) ×1 IMPLANT
GLOVE BIOGEL PI INDICATOR 8 (GLOVE) ×2
GLOVE ECLIPSE 8.0 STRL XLNG CF (GLOVE) ×3 IMPLANT
GOWN STRL REUS W/ TWL LRG LVL3 (GOWN DISPOSABLE) ×2 IMPLANT
GOWN STRL REUS W/TWL LRG LVL3 (GOWN DISPOSABLE) ×6
KIT MARKER MARGIN INK (KITS) ×3 IMPLANT
NEEDLE HYPO 25X1 1.5 SAFETY (NEEDLE) ×3 IMPLANT
NS IRRIG 1000ML POUR BTL (IV SOLUTION) ×3 IMPLANT
PACK BASIN DAY SURGERY FS (CUSTOM PROCEDURE TRAY) ×3 IMPLANT
PENCIL SMOKE EVACUATOR (MISCELLANEOUS) ×3 IMPLANT
SLEEVE SCD COMPRESS KNEE MED (MISCELLANEOUS) ×3 IMPLANT
SPONGE LAP 4X18 RFD (DISPOSABLE) ×3 IMPLANT
SUT MNCRL AB 4-0 PS2 18 (SUTURE) ×3 IMPLANT
SUT VICRYL 3-0 CR8 SH (SUTURE) ×3 IMPLANT
SYR CONTROL 10ML LL (SYRINGE) ×3 IMPLANT
TOWEL GREEN STERILE FF (TOWEL DISPOSABLE) ×3 IMPLANT
TRAY FAXITRON CT DISP (TRAY / TRAY PROCEDURE) ×3 IMPLANT

## 2020-03-26 NOTE — Anesthesia Procedure Notes (Signed)
Procedure Name: LMA Insertion Date/Time: 03/26/2020 11:11 AM Performed by: Imagene Riches, CRNA Pre-anesthesia Checklist: Patient identified, Emergency Drugs available, Suction available and Patient being monitored Patient Re-evaluated:Patient Re-evaluated prior to induction Oxygen Delivery Method: Circle System Utilized Preoxygenation: Pre-oxygenation with 100% oxygen Induction Type: IV induction Ventilation: Mask ventilation without difficulty LMA: LMA inserted LMA Size: 4.0 Number of attempts: 1 Airway Equipment and Method: Bite block Placement Confirmation: positive ETCO2 Tube secured with: Tape Dental Injury: Teeth and Oropharynx as per pre-operative assessment

## 2020-03-26 NOTE — Anesthesia Preprocedure Evaluation (Signed)
Anesthesia Evaluation  Patient identified by MRN, date of birth, ID band Patient awake    Reviewed: Allergy & Precautions, H&P , NPO status , Patient's Chart, lab work & pertinent test results  Airway Mallampati: III  TM Distance: >3 FB Neck ROM: Full    Dental no notable dental hx. (+) Teeth Intact, Caps, Dental Advisory Given   Pulmonary neg pulmonary ROS, asthma , sleep apnea ,    Pulmonary exam normal breath sounds clear to auscultation       Cardiovascular hypertension, Pt. on medications Normal cardiovascular exam Rhythm:Regular Rate:Normal     Neuro/Psych negative neurological ROS  negative psych ROS   GI/Hepatic Neg liver ROS, GERD  ,  Endo/Other  negative endocrine ROS  Renal/GU Renal InsufficiencyRenal disease  negative genitourinary   Musculoskeletal  (+) Arthritis , Osteoarthritis,    Abdominal   Peds  Hematology negative hematology ROS (+)   Anesthesia Other Findings   Reproductive/Obstetrics negative OB ROS                             Anesthesia Physical  Anesthesia Plan  ASA: II  Anesthesia Plan: General   Post-op Pain Management:    Induction: Intravenous  PONV Risk Score and Plan: 3 and Ondansetron, Dexamethasone, Midazolam and Treatment may vary due to age or medical condition  Airway Management Planned: LMA  Additional Equipment:   Intra-op Plan:   Post-operative Plan: Extubation in OR  Informed Consent: I have reviewed the patients History and Physical, chart, labs and discussed the procedure including the risks, benefits and alternatives for the proposed anesthesia with the patient or authorized representative who has indicated his/her understanding and acceptance.     Dental advisory given  Plan Discussed with: CRNA  Anesthesia Plan Comments:         Anesthesia Quick Evaluation

## 2020-03-26 NOTE — Anesthesia Postprocedure Evaluation (Signed)
Anesthesia Post Note  Patient: Mackenzie King  Procedure(s) Performed: RIGHT BREAST LUMPECTOMY WITH RADIOACTIVE SEED LOCALIZATION (Right Breast)     Patient location during evaluation: PACU Anesthesia Type: General Level of consciousness: awake Pain management: pain level controlled Vital Signs Assessment: post-procedure vital signs reviewed and stable Respiratory status: spontaneous breathing and respiratory function stable Cardiovascular status: stable Postop Assessment: no apparent nausea or vomiting Anesthetic complications: no   No complications documented.  Last Vitals:  Vitals:   03/26/20 1215 03/26/20 1224  BP: (!) 146/64   Pulse: 60 (!) 58  Resp: 15 14  Temp:    SpO2: 100% 97%    Last Pain:  Vitals:   03/26/20 1230  TempSrc:   PainSc: 2                  Merlinda Frederick

## 2020-03-26 NOTE — Op Note (Signed)
Preoperative diagnosis: Right breast atypical ductal hyperplasia  Postoperative diagnosis: Same  Procedure: Right breast seed localized lumpectomy using 2 seeds to bracket the 1 clip upper outer quadrant right breast  Surgeon: Erroll Luna, MD  Anesthesia: General with local  EBL: Minimal  Specimen: Right breast tissue with both seeds and single biopsy marker verified by Faxitron  Drains: None  Indications for procedure: The patient is a 79 year old female who had microcalcifications a density in the right upper outer quadrant on her mammogram.  Core biopsy showed atypical ductal hyperplasia.  2 seeds were placed to bracket the area since it measured about 48 cm in maximal diameter.  We discussed rationale for excision to exclude malignancy.  Risk, benefits and other treatment options were discussed with the patient.The procedure has been discussed with the patient. Alternatives to surgery have been discussed with the patient.  Risks of surgery include bleeding,  Infection,  Seroma formation, death,  and the need for further surgery.   The patient understands and wishes to proceed.   Description of procedure: The patient was met in the holding area.  Neoprobe used to identify both seeds right breast upper outer quadrant.  Questions were answered.  She was then taken back to the operating.  She was placed supine upon the OR table.  Induction general esthesia, the right breast was prepped and draped in sterile fashion and timeout was performed.  Proper patient, site and procedure were verified.  Antibiotics were administered in a timely fashion.  Neoprobe used to identify the area in the right breast and both seeds could be detected.  Curvilinear incision was made along the lateral border of the nipple areolar complex.  A triangular shaped tissue was excised with both seeds bracketing the clip.  Faxitron revealed both seeds and single clip be present with the microcalcifications.  The sent to  pathology.  Gross margins appear negative.  The cavities made hemostatic with cautery.  We used local anesthetic to help infiltrate the cavity for pain control.  We then closed the cavity with 3-0 Vicryl and 4-0 Monocryl.  Dermabond was applied.  All counts found to be correct.  Breast binder placed.  The patient was awoke extubated taken to recovery in satisfactory condition.

## 2020-03-26 NOTE — Discharge Instructions (Signed)
[facility] °Office Phone Number [phone] ° °BREAST BIOPSY/ PARTIAL MASTECTOMY: POST OP INSTRUCTIONS ° °Always review your discharge instruction sheet given to you by the facility where your surgery was performed. ° °IF YOU HAVE DISABILITY OR FAMILY LEAVE FORMS, YOU MUST BRING THEM TO THE OFFICE FOR PROCESSING.  DO NOT GIVE THEM TO YOUR DOCTOR. ° °1. A prescription for pain medication may be given to you upon discharge.  Take your pain medication as prescribed, if needed.  If narcotic pain medicine is not needed, then you may take acetaminophen (Tylenol) or ibuprofen (Advil) as needed. °2. Take your usually prescribed medications unless otherwise directed °3. If you need a refill on your pain medication, please contact your pharmacy.  They will contact our office to request authorization.  Prescriptions will not be filled after 5pm or on week-ends. °4. You should eat very light the first 24 hours after surgery, such as soup, crackers, pudding, etc.  Resume your normal diet the day after surgery. °5. Most patients will experience some swelling and bruising in the breast.  Ice packs and a good support bra will help.  Swelling and bruising can take several days to resolve.  °6. It is common to experience some constipation if taking pain medication after surgery.  Increasing fluid intake and taking a stool softener will usually help or prevent this problem from occurring.  A mild laxative (Milk of Magnesia or Miralax) should be taken according to package directions if there are no bowel movements after 48 hours. °7. Unless discharge instructions indicate otherwise, you may remove your bandages 24-48 hours after surgery, and you may shower at that time.  You may have steri-strips (small skin tapes) in place directly over the incision.  These strips should be left on the skin for 7-10 days.  If your surgeon used skin glue on the incision, you may shower in 24 hours.  The glue will flake off over the  next 2-3 weeks.  Any sutures or staples will be removed at the office during your follow-up visit. °8. ACTIVITIES:  You may resume regular daily activities (gradually increasing) beginning the next day.  Wearing a good support bra or sports bra minimizes pain and swelling.  You may have sexual intercourse when it is comfortable. °a. You may drive when you no longer are taking prescription pain medication, you can comfortably wear a seatbelt, and you can safely maneuver your car and apply brakes. °b. RETURN TO WORK:  ______________________________________________________________________________________ °9. You should see your doctor in the office for a follow-up appointment approximately two weeks after your surgery.  Your doctor’s nurse will typically make your follow-up appointment when she calls you with your pathology report.  Expect your pathology report 2-3 business days after your surgery.  You may call to check if you do not hear from us after three days. °10. OTHER INSTRUCTIONS: _______________________________________________________________________________________________ _____________________________________________________________________________________________________________________________________ °_____________________________________________________________________________________________________________________________________ °_____________________________________________________________________________________________________________________________________ ° °WHEN TO CALL YOUR DOCTOR: °1. Fever over 101.0 °2. Nausea and/or vomiting. °3. Extreme swelling or bruising. °4. Continued bleeding from incision. °5. Increased pain, redness, or drainage from the incision. ° °The clinic staff is available to answer your questions during regular business hours.  Please don’t hesitate to call and ask to speak to one of the nurses for clinical concerns.  If you have a medical emergency, go to the nearest  emergency room or call 911.  A surgeon from [facility] is always on call at the hospital. ° °For further questions, please visit [email or web address]  ° ° ° ° °  Post Anesthesia Home Care Instructions ° °Activity: °Get plenty of rest for the remainder of the day. A responsible individual must stay with you for 24 hours following the procedure.  °For the next 24 hours, DO NOT: °-Drive a car °-Operate machinery °-Drink alcoholic beverages °-Take any medication unless instructed by your physician °-Make any legal decisions or sign important papers. ° °Meals: °Start with liquid foods such as gelatin or soup. Progress to regular foods as tolerated. Avoid greasy, spicy, heavy foods. If nausea and/or vomiting occur, drink only clear liquids until the nausea and/or vomiting subsides. Call your physician if vomiting continues. ° °Special Instructions/Symptoms: °Your throat may feel dry or sore from the anesthesia or the breathing tube placed in your throat during surgery. If this causes discomfort, gargle with warm salt water. The discomfort should disappear within 24 hours. ° °If you had a scopolamine patch placed behind your ear for the management of post- operative nausea and/or vomiting: ° °1. The medication in the patch is effective for 72 hours, after which it should be removed.  Wrap patch in a tissue and discard in the trash. Wash hands thoroughly with soap and water. °2. You may remove the patch earlier than 72 hours if you experience unpleasant side effects which may include dry mouth, dizziness or visual disturbances. °3. Avoid touching the patch. Wash your hands with soap and water after contact with the patch. °  ° °

## 2020-03-26 NOTE — H&P (Signed)
Mackenzie King  Location: [facility] Patient #: [id number] DOB: 09-26-1940 Widowed / Language: English / Race: White Female  History of Present IllnessPatient words: Patient presents for evaluation of abnormal right breast mammogram. Her recent screening and subsequent diagnostic mammogram showed a 5 cm cluster of right breast calcifications upper outer quadrant. Core biopsy showed atypical ductal hyperplasia. She has a history of left breast biopsy back in the 1990s for benign disease. There is no family history of breast cancer and she has been screened yearly. The patient denies any history of breast pain, breast masses or nipple discharge.  The patient is a 79 year old female.   Past Surgical History  Breast Biopsy multiple Colon Polyp Removal - Colonoscopy Foot Surgery Right.  Diagnostic Studies History  Colonoscopy within last year Mammogram within last year  Allergies Sulfa Antibiotics Cipro *FLUOROQUINOLONES* Allergies Reconciled  Medication History  Lisinopril (40MG  Tablet, Oral) Active. Furosemide (20MG  Tablet, Oral) Active. Pravastatin Sodium (40MG  Tablet, Oral) Active. Symbicort (160-4.5MCG/ACT Aerosol, Inhalation) Active. ProAir HFA (108 (90 Base)MCG/ACT Aerosol Soln, Inhalation) Active. Solifenacin Succinate (5MG  Tablet, Oral) Active. hydroCHLOROthiazide (12.5MG  Tablet, Oral) Active. Medications Reconciled  Social History () Alcohol use Occasional alcohol use. No caffeine use No drug use Tobacco use Never smoker.  Family History Breast Cancer Mother. Hypertension Mother.  Pregnancy / Birth History  Age at menarche 7 years. Age of menopause 39-60 Contraceptive History Oral contraceptives. Gravida 4 Maternal age 60-25  Other Problems Arthritis Asthma Bladder Problems Hemorrhoids High blood pressure     Review of Systems  General Not Present- Appetite Loss, Chills, Fatigue, Fever,  Night Sweats, Weight Gain and Weight Loss. Skin Not Present- Change in Wart/Mole, Dryness, Hives, Jaundice, New Lesions, Non-Healing Wounds, Rash and Ulcer. HEENT Present- Hearing Loss and Wears glasses/contact lenses. Not Present- Earache, Hoarseness, Nose Bleed, Oral Ulcers, Ringing in the Ears, Seasonal Allergies, Sinus Pain, Sore Throat, Visual Disturbances and Yellow Eyes. Cardiovascular Not Present- Chest Pain, Difficulty Breathing Lying Down, Leg Cramps, Palpitations, Rapid Heart Rate, Shortness of Breath and Swelling of Extremities. Gastrointestinal Not Present- Abdominal Pain, Bloating, Bloody Stool, Change in Bowel Habits, Chronic diarrhea, Constipation, Difficulty Swallowing, Excessive gas, Gets full quickly at meals, Hemorrhoids, Indigestion, Nausea, Rectal Pain and Vomiting. Female Genitourinary Present- Frequency. Not Present- Nocturia, Painful Urination, Pelvic Pain and Urgency. Musculoskeletal Not Present- Back Pain, Joint Pain, Joint Stiffness, Muscle Pain, Muscle Weakness and Swelling of Extremities. Neurological Not Present- Decreased Memory, Fainting, Headaches, Numbness, Seizures, Tingling, Tremor, Trouble walking and Weakness. Psychiatric Not Present- Anxiety, Bipolar, Change in Sleep Pattern, Depression, Fearful and Frequent crying. Endocrine Present- Hair Changes. Not Present- Cold Intolerance, Excessive Hunger, Heat Intolerance, Hot flashes and New Diabetes. Hematology Not Present- Blood Thinners, Easy Bruising, Excessive bleeding, Gland problems, HIV and Persistent Infections.  Vitals 02/17/2020 2:06 PM Weight: 172.25 lb Height: 62in Body Surface Area: 1.79 m Body Mass Index: 31.5 kg/m  Temp.: 10F  Pulse: 76 (Regular)  BP: 126/76(Sitting, Left Arm, Standard)        Physical Exam   General Mental Status-Alert. General Appearance-Consistent with stated age. Hydration-Well hydrated. Voice-Normal.  Head and  Neck Head-normocephalic, atraumatic with no lesions or palpable masses. Trachea-midline. Thyroid Gland Characteristics - normal size and consistency.  Breast Breast - Left-Symmetric, Non Tender, No Biopsy scars, no Dimpling - Left, No Inflammation, No Lumpectomy scars, No Mastectomy scars, No Peau d' Orange. Breast - Right-Symmetric, Non Tender, No Biopsy scars, no Dimpling - Right, No Inflammation, No Lumpectomy scars, No Mastectomy scars, No Peau d'  Orange. Breast Lump-No Palpable Breast Mass.  Cardiovascular Cardiovascular examination reveals -normal heart sounds, regular rate and rhythm with no murmurs and normal pedal pulses bilaterally.  Neurologic Neurologic evaluation reveals -alert and oriented x 3 with no impairment of recent or remote memory. Mental Status-Normal.  Lymphatic Head & Neck  General Head & Neck Lymphatics: Bilateral - Description - Normal. Axillary  General Axillary Region: Bilateral - Description - Normal. Tenderness - Non Tender.    Assessment & Plan  ATYPICAL DUCTAL HYPERPLASIA OF RIGHT BREAST (N60.91) Impression: Discussed seed localized right breast lumpectomy for atypical ductal hyperplasia and the potential risk of up to 20%. Observation was discussed. She is opted to undergo a right breast seed localized lumpectomy Risk of lumpectomy include bleeding, infection, seroma, more surgery, use of seed/wire, wound care, cosmetic deformity and the need for other treatments, death , blood clots, death. Pt agrees to proceed.  Current Plans Pt Education - [facility] Education - Written Instructions given: discussed with patient and provided information. Pt Education - [facility] General Post-op HCI You are being scheduled for surgery- Our schedulers will call you.  You should hear from our office's scheduling department within 5 working days about the location, date, and time of surgery. We try to make accommodations for patient's preferences  in scheduling surgery, but sometimes the OR schedule or the surgeon's schedule prevents [place] from making those accommodations.  If you have not heard from our office ([phone]) in 5 working days, call the office and ask for your surgeon's nurse.  If you have other questions about your diagnosis, plan, or surgery, call the office and ask for your surgeon's nurse.  Pt Education - Pamphlet Given - Breast Biopsy: discussed with patient and provided information.

## 2020-03-26 NOTE — Transfer of Care (Signed)
Immediate Anesthesia Transfer of Care Note  Patient: Mackenzie King  Procedure(s) Performed: RIGHT BREAST LUMPECTOMY WITH RADIOACTIVE SEED LOCALIZATION (Right Breast)  Patient Location: PACU  Anesthesia Type:General  Level of Consciousness: drowsy  Airway & Oxygen Therapy: Patient Spontanous Breathing and Patient connected to nasal cannula oxygen  Post-op Assessment: Report given to RN and Post -op Vital signs reviewed and stable  Post vital signs: Reviewed and stable  Last Vitals:  Vitals Value Taken Time  BP 148/64 03/26/20 1200  Temp    Pulse 63 03/26/20 1200  Resp 12 03/26/20 1200  SpO2 100 % 03/26/20 1200  Vitals shown include unvalidated device data.  Last Pain:  Vitals:   03/26/20 0931  TempSrc: Oral  PainSc: 0-No pain         Complications: No complications documented.

## 2020-03-27 ENCOUNTER — Encounter (HOSPITAL_BASED_OUTPATIENT_CLINIC_OR_DEPARTMENT_OTHER): Payer: Self-pay | Admitting: Surgery

## 2020-04-13 DIAGNOSIS — N183 Chronic kidney disease, stage 3 unspecified: Secondary | ICD-10-CM | POA: Diagnosis not present

## 2020-04-13 DIAGNOSIS — I129 Hypertensive chronic kidney disease with stage 1 through stage 4 chronic kidney disease, or unspecified chronic kidney disease: Secondary | ICD-10-CM | POA: Diagnosis not present

## 2020-04-13 DIAGNOSIS — F324 Major depressive disorder, single episode, in partial remission: Secondary | ICD-10-CM | POA: Diagnosis not present

## 2020-04-13 DIAGNOSIS — E785 Hyperlipidemia, unspecified: Secondary | ICD-10-CM | POA: Diagnosis not present

## 2020-04-13 DIAGNOSIS — K219 Gastro-esophageal reflux disease without esophagitis: Secondary | ICD-10-CM | POA: Diagnosis not present

## 2020-04-13 DIAGNOSIS — J4520 Mild intermittent asthma, uncomplicated: Secondary | ICD-10-CM | POA: Diagnosis not present

## 2020-04-14 ENCOUNTER — Other Ambulatory Visit: Payer: Self-pay

## 2020-04-14 ENCOUNTER — Encounter: Payer: Self-pay | Admitting: Cardiology

## 2020-04-14 ENCOUNTER — Telehealth (INDEPENDENT_AMBULATORY_CARE_PROVIDER_SITE_OTHER): Payer: Medicare Other | Admitting: Cardiology

## 2020-04-14 VITALS — BP 148/78 | HR 63 | Ht 61.75 in | Wt 172.8 lb

## 2020-04-14 DIAGNOSIS — G4733 Obstructive sleep apnea (adult) (pediatric): Secondary | ICD-10-CM

## 2020-04-14 DIAGNOSIS — I10 Essential (primary) hypertension: Secondary | ICD-10-CM | POA: Diagnosis not present

## 2020-04-14 DIAGNOSIS — E669 Obesity, unspecified: Secondary | ICD-10-CM

## 2020-04-14 NOTE — Progress Notes (Signed)
Virtual Visit via Telephone  Note   This visit type was conducted due to national recommendations for restrictions regarding the COVID-19 Pandemic (e.g. social distancing) in an effort to limit this patient's exposure and mitigate transmission in our community.  Due to her co-morbid illnesses, this patient is at least at moderate risk for complications without adequate follow up.  This format is felt to be most appropriate for this patient at this time.  All issues noted in this document were discussed and addressed.  A limited physical exam was performed with this format.  Please refer to the patient's chart for her consent to telehealth for [facility].   Evaluation Performed:  Follow-up visit  This visit type was conducted due to national recommendations for restrictions regarding the COVID-19 Pandemic (e.g. social distancing).  This format is felt to be most appropriate for this patient at this time.  All issues noted in this document were discussed and addressed.  No physical exam was performed (except for noted visual exam findings with Video Visits).  Please refer to the patient's chart (MyChart message for video visits and phone note for telephone visits) for the patient's consent to telehealth for [facility].  Date:  04/14/2020   ID:  Mackenzie King, Mackenzie King June 09, 1940, MRN [id number]  Patient Location:  Home  Provider location:   [place]  PCP:  [name], MD  Sleep Medicine:  Fransico Him, MD Electrophysiologist:  None   Chief Complaint:  OSA, HTN  History of Present Illness:    Mackenzie King is a 79 y.o. female who presents via audio/video conferencing for a telehealth visit today.    Mackenzie King is a 79 y.o. female with a hx of mildOSA(AHI 6.35/hr with excessive daytime sleepiness and initial epworth sleepiness score of 15) on CPAP andHTN. She is doing well with her CPAP device and thinks that she has gotten used to it.  She tolerates the mask and feels  the pressure is adequate.  Since going on CPAP she feels rested in the am and has no significant daytime sleepiness.  She denies any significant mouth or nasal dryness or nasal congestion.  She does not think that he snores.    The patient does not have symptoms concerning for COVID-19 infection (fever, chills, cough, or new shortness of breath).   Prior CV studies:   The following studies were reviewed today:  PAP compliance download  Past Medical History:  Diagnosis Date  . Abdominal distention   . Asthma   . Asymptomatic gallstones   . Atypical ductal hyperplasia of right breast   . 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 CKD (chronic kidney disease), stage III ([facility])    Dr Florene Glen  . Colon polyps    hyperplastic  . Dermatitis    intermittent Dr Shelia Media  . Dyslipidemia   . Facet arthropathy, cervical   . GERD (gastroesophageal reflux disease)   . Hearing loss   . Hypertension   . Nasal congestion   . Obstructive sleep apnea    uses CPAP nightly  . Osteoarthritis    low back  . Osteopenia    mild  . Plantar fasciitis    s/p shock tx Dr Paulla Dolly  . Pure hypercholesterolemia   . Restless legs syndrome (RLS)   . Wheezing    Past Surgical History:  Procedure Laterality Date  . BREAST LUMPECTOMY WITH RADIOACTIVE SEED LOCALIZATION Right 03/26/2020   Procedure: RIGHT BREAST LUMPECTOMY WITH RADIOACTIVE  SEED LOCALIZATION;  Surgeon: Erroll Luna, MD;  Location: [facility];  Service: General;  Laterality: Right;  . BREAST SURGERY  1996   breast biopsy - benign calcifications  . BREAST SURGERY  1977   adenofibroma removed - 3 biopsy's  . CHOLECYSTECTOMY  03/19/2012   Procedure: LAPAROSCOPIC CHOLECYSTECTOMY WITH INTRAOPERATIVE CHOLANGIOGRAM;  Surgeon: Adin Hector, MD;  Location: [facility];  Service: General;  Laterality: N/A;  . CHOLECYSTECTOMY  03/19/2012   Procedure: CHOLECYSTECTOMY;  Surgeon: Adin Hector, MD;  Location: [facility];  Service: General;   Laterality: N/A;  converted to open @ 1137  . EYE SURGERY  02/2012   bilateral cataracts with lens implants  . FOOT NEUROMA SURGERY     right foot  . LIPOMA EXCISION    . 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 TUBAL LIGATION  1971     Current Meds  Medication Sig  . acetaminophen (TYLENOL 8 HOUR ARTHRITIS PAIN) 650 MG CR tablet Take 650 mg by mouth daily.   Marland Kitchen albuterol (PROVENTIL HFA;VENTOLIN HFA) 108 (90 BASE) MCG/ACT inhaler Inhale 2 puffs into the lungs every 6 (six) hours as needed. For shortness of breath  . amLODipine (NORVASC) 2.5 MG tablet Take 2.5 mg by mouth daily.  Marland Kitchen b complex vitamins tablet Take 1 tablet by mouth daily.  . cetirizine (ZYRTEC) 10 MG tablet Take 10 mg by mouth at bedtime.   . Cholecalciferol (VITAMIN D) 2000 UNITS tablet Take 2,000 Units by mouth at bedtime.   . Coenzyme Q10 (COQ10) 100 MG CAPS Take 1 capsule by mouth daily.  . Cranberry-Vitamin C-Vitamin E (CRANBERRY PLUS VITAMIN C) 4200-20-3 MG-MG-UNIT CAPS Take 2 capsules by mouth daily.  . famotidine (PEPCID) 20 MG tablet Take 40 mg by mouth as needed for heartburn or indigestion.  . fluticasone (FLONASE) 50 MCG/ACT nasal spray Place 2 sprays into the nose daily as needed. For allergies  . furosemide (LASIX) 40 MG tablet Take 40 mg by mouth as needed for fluid or edema.  . Melatonin 12 MG TABS Take 1 tablet by mouth as needed.  . Multiple Vitamins-Minerals (CENTRUM SILVER 50+WOMEN PO) Take 1 tablet by mouth daily.  . pravastatin (PRAVACHOL) 40 MG tablet Take 40 mg by mouth daily.   . solifenacin (VESICARE) 5 MG tablet Take 10 mg by mouth daily.   . SYMBICORT 160-4.5 MCG/ACT inhaler Place 2 puffs into both nostrils 2 (two) times daily.  Marland Kitchen tiZANidine (ZANAFLEX) 2 MG tablet Take 2 mg by mouth at bedtime as needed. Restless legs  . valsartan (DIOVAN) 320 MG tablet Take 320 mg by mouth daily.     Allergies:   Crestor [rosuvastatin], Sulfa antibiotics, and  Ciprofloxacin   Social History   Tobacco Use  . Smoking status: Never Smoker  . Smokeless tobacco: Never Used  Vaping Use  . Vaping Use: Never used  Substance Use Topics  . Alcohol use: Not Currently  . Drug use: No     Family Hx: The patient's family history includes Alzheimer's disease in her mother; Cancer in her mother, paternal grandfather, and sister; Colon polyps in her father; Hypertension in her mother; Stroke in her father; Transient ischemic attack in her mother.  ROS:   Please see the history of present illness.     All other systems reviewed and are negative.   Labs/Other Tests and Data Reviewed:    Recent Labs: 03/23/2020: ALT 16; BUN 12;  Creatinine, Ser 1.01; Hemoglobin 12.0; Platelets 342; Potassium 4.6; Sodium 137   Recent Lipid Panel No results found for: CHOL, TRIG, HDL, CHOLHDL, LDLCALC, LDLDIRECT  Wt Readings from Last 3 Encounters:  04/14/20 172 lb 12.8 oz (78.4 kg)  03/26/20 171 lb 4.8 oz (77.7 kg)  04/12/19 171 lb 8 oz (77.8 kg)     Objective:    Vital Signs:  BP (!) 148/78   Pulse 63   Ht 5' 1.75" (1.568 m)   Wt 172 lb 12.8 oz (78.4 kg)   BMI 31.86 kg/m     ASSESSMENT & PLAN:    1.  OSA - The patient is tolerating PAP therapy well without any problems. The PAP download was reviewed today and showed an AHI of 0.7/hr on 8 cm H2O with 90% compliance in using more than 4 hours nightly.  The patient has been using and benefiting from PAP use and will continue to benefit from therapy.   2.  HTN -BP borderline controlled on exam -continue amlodipine 2.5mg  daily and Valsartan 320mg  daily  3.  Obesity -I have encouraged her to get into a routine exercise program and cut back on carbs and portions.   COVID-19 Education: She is fully vaccinated including the booster  Patient Risk:   After full review of this patient's clinical status, I feel that they are at least moderate risk at this time.  Time:   Today, I have spent 15 minutes on  telemedicine discussing medical problems including OSA, HTN, obesity and reviewing patient's chart including PAP compliance download.  Medication Adjustments/Labs and Tests Ordered: Current medicines are reviewed at length with the patient today.  Concerns regarding medicines are outlined above.  Tests Ordered: No orders of the defined types were placed in this encounter.  Medication Changes: No orders of the defined types were placed in this encounter.   Disposition:  Follow up in 1 year(s)  Signed, Fransico Him, MD  04/14/2020 1:06 PM    [facility]

## 2020-05-01 LAB — SURGICAL PATHOLOGY

## 2020-05-04 ENCOUNTER — Telehealth: Payer: Self-pay | Admitting: Hematology and Oncology

## 2020-05-04 NOTE — Telephone Encounter (Signed)
Received a new pt referral from Dr. Brantley Stage for dx of breast cancer. Ms. Mackenzie King has been cld and scheduled to see Dr. Lindi Adie on 12/20 at 1pm. Pt aware to arrive 30 minutes early.

## 2020-05-05 ENCOUNTER — Inpatient Hospital Stay
Admission: RE | Admit: 2020-05-05 | Discharge: 2020-05-05 | Disposition: A | Discharge status: Home / Self Care | Payer: Self-pay | Source: Ambulatory Visit | Attending: Radiation Oncology | Admitting: Radiation Oncology

## 2020-05-05 ENCOUNTER — Other Ambulatory Visit: Payer: Self-pay | Admitting: Radiation Oncology

## 2020-05-05 DIAGNOSIS — D0511 Intraductal carcinoma in situ of right breast: Secondary | ICD-10-CM

## 2020-05-06 DIAGNOSIS — I129 Hypertensive chronic kidney disease with stage 1 through stage 4 chronic kidney disease, or unspecified chronic kidney disease: Secondary | ICD-10-CM | POA: Diagnosis not present

## 2020-05-06 DIAGNOSIS — K219 Gastro-esophageal reflux disease without esophagitis: Secondary | ICD-10-CM | POA: Diagnosis not present

## 2020-05-06 DIAGNOSIS — J4520 Mild intermittent asthma, uncomplicated: Secondary | ICD-10-CM | POA: Diagnosis not present

## 2020-05-06 DIAGNOSIS — E785 Hyperlipidemia, unspecified: Secondary | ICD-10-CM | POA: Diagnosis not present

## 2020-05-06 DIAGNOSIS — F324 Major depressive disorder, single episode, in partial remission: Secondary | ICD-10-CM | POA: Diagnosis not present

## 2020-05-06 DIAGNOSIS — N183 Chronic kidney disease, stage 3 unspecified: Secondary | ICD-10-CM | POA: Diagnosis not present

## 2020-05-06 NOTE — Progress Notes (Signed)
Location of Breast Cancer: Right Breast   Did patient present with symptoms (if so, please note symptoms) or was this found on screening mammography?: Routine Mammogram  Mammogram: 5 cm cluster of right breast calcifications upper outer quadrant.  Histology per Pathology Report: Right Breast Lumpectomy 03/26/2020  Receptor Status: ER(70% +), PR (10% +), Her2-neu (-), Ki-67(5%)    Past/Anticipated interventions by surgeon, if any: Dr. Brantley Stage 03/26/2020 -Discussed seed localized right breast lumpectomy for atypical ductal hyperplasia and the potential risk of up to 20%. Observation was discussed. - She has opted to undergo a right breast seed localized lumpectomy. - Right Breast lumpectomy with radioactive seed localization 03/26/2020 Follow-up 04/28/2020 -She returns 3 weeks after right breast lumpectomy for atypical ductal hyperplasia.  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  The DCIS was less than 0.1 cm from the superior lateral margin but otherwise her margins were clear. -Refer to medial and radiation oncology.  Past/Anticipated interventions by medical oncology, if any: Chemotherapy  Dr. Lindi Adie 05/11/2020 1 pm   Lymphedema issues, if any:  No  Pain issues, if any:  Has occasional sharp shooting pain in her breast.  SAFETY ISSUES:  Prior radiation? No  Pacemaker/ICD? No  Possible current pregnancy? Postmenopausal  Is the patient on methotrexate? No  Current Complaints / other details:      Mackenzie Razor, RN 05/06/2020,10:17 AM

## 2020-05-07 ENCOUNTER — Encounter: Payer: Self-pay | Admitting: Radiation Oncology

## 2020-05-07 ENCOUNTER — Other Ambulatory Visit: Payer: Self-pay

## 2020-05-07 ENCOUNTER — Ambulatory Visit
Admission: RE | Admit: 2020-05-07 | Discharge: 2020-05-07 | Disposition: A | Discharge status: Home / Self Care | Payer: Medicare Other | Source: Ambulatory Visit | Attending: Radiation Oncology | Admitting: Radiation Oncology

## 2020-05-07 VITALS — BP 159/67 | HR 71 | Temp 97.5°F | Resp 18 | Ht 61.75 in | Wt 176.6 lb

## 2020-05-07 DIAGNOSIS — D0511 Intraductal carcinoma in situ of right breast: Secondary | ICD-10-CM | POA: Insufficient documentation

## 2020-05-07 DIAGNOSIS — J45909 Unspecified asthma, uncomplicated: Secondary | ICD-10-CM | POA: Insufficient documentation

## 2020-05-07 DIAGNOSIS — N183 Chronic kidney disease, stage 3 unspecified: Secondary | ICD-10-CM | POA: Diagnosis not present

## 2020-05-07 DIAGNOSIS — I129 Hypertensive chronic kidney disease with stage 1 through stage 4 chronic kidney disease, or unspecified chronic kidney disease: Secondary | ICD-10-CM | POA: Diagnosis not present

## 2020-05-07 DIAGNOSIS — Z170 Estrogen receptor positive status [ER+]: Secondary | ICD-10-CM | POA: Diagnosis not present

## 2020-05-07 DIAGNOSIS — Z86010 Personal history of colonic polyps: Secondary | ICD-10-CM | POA: Diagnosis not present

## 2020-05-07 DIAGNOSIS — Z79899 Other long term (current) drug therapy: Secondary | ICD-10-CM | POA: Insufficient documentation

## 2020-05-07 DIAGNOSIS — M8580 Other specified disorders of bone density and structure, unspecified site: Secondary | ICD-10-CM | POA: Diagnosis not present

## 2020-05-07 DIAGNOSIS — E7800 Pure hypercholesterolemia, unspecified: Secondary | ICD-10-CM | POA: Insufficient documentation

## 2020-05-07 DIAGNOSIS — E785 Hyperlipidemia, unspecified: Secondary | ICD-10-CM | POA: Diagnosis not present

## 2020-05-07 DIAGNOSIS — K219 Gastro-esophageal reflux disease without esophagitis: Secondary | ICD-10-CM | POA: Insufficient documentation

## 2020-05-10 NOTE — Progress Notes (Signed)
[facility] CONSULT NOTE  Patient Care Team: [name], MD as PCP - General (Family Medicine) Sueanne Margarita, MD as PCP - Cardiology (Cardiology)  CHIEF COMPLAINTS/PURPOSE OF CONSULTATION:  Newly diagnosed breast cancer  HISTORY OF PRESENTING ILLNESS:  Mackenzie King 79 y.o. female is here because of recent diagnosis of ductal carcinoma in situ of the right breast. Screening mammogram on 12/12/19 showed right breast calcifications. Diagnostic mammogram on 01/03/20 showed the calcifications spanning 5.0cm suspicious for malignancy. Biopsy on 01/29/20 showed atypical ductal hyperplasia. She underwent a right lumpectomy on 03/26/20 with Dr. Brantley Stage for which pathology showed ductal carcinoma in situ, 2.0cm, involving a complex sclerosing lesion, clear margins. She presents to the clinic today for initial evaluation and discussion of treatment options.   I reviewed her records extensively and collaborated the history with the patient.  SUMMARY OF ONCOLOGIC HISTORY: Oncology History  Ductal carcinoma in situ (DCIS) of right breast  03/26/2020 Surgery   Right lumpectomy ([facility]): DCIS, 2.0cm, involving a complex sclerosing lesion, clear margins.   05/07/2020 Initial Diagnosis   Screening mammogram showed right breast calcifications spanning 5.0cm. Biopsy showed atypical ductal hyperplasia.  ER 70%, PR 10%, Ki-67 5%   05/11/2020 Cancer Staging   Staging form: Breast, AJCC 8th Edition - Clinical: Stage 0 (cTis (DCIS), cN0, cM0, G2, ER+, PR+, HER2: Not Assessed) - Signed by Nicholas Lose, MD on 05/11/2020     MEDICAL HISTORY:  Past Medical History:  Diagnosis Date  . Abdominal distention   . Asthma   . Asymptomatic gallstones   . Atypical ductal hyperplasia of right breast   . 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 CKD (chronic kidney disease), stage III (HCC)    Dr Florene Glen  . Colon polyps    hyperplastic  . Dermatitis    intermittent Dr Shelia Media  .  Dyslipidemia   . Facet arthropathy, cervical   . GERD (gastroesophageal reflux disease)   . Hearing loss   . Hypertension   . Nasal congestion   . Obstructive sleep apnea    uses CPAP nightly  . Osteoarthritis    low back  . Osteopenia    mild  . Plantar fasciitis    s/p shock tx Dr Paulla Dolly  . Pure hypercholesterolemia   . Restless legs syndrome (RLS)   . Wheezing     SURGICAL HISTORY: Past Surgical History:  Procedure Laterality Date  . BREAST LUMPECTOMY WITH RADIOACTIVE SEED LOCALIZATION Right 03/26/2020   Procedure: RIGHT BREAST LUMPECTOMY WITH RADIOACTIVE SEED LOCALIZATION;  Surgeon: Erroll Luna, MD;  Location: [facility];  Service: General;  Laterality: Right;  . BREAST SURGERY  1996   breast biopsy - benign calcifications  . BREAST SURGERY  1977   adenofibroma removed - 3 biopsy's  . CHOLECYSTECTOMY  03/19/2012   Procedure: LAPAROSCOPIC CHOLECYSTECTOMY WITH INTRAOPERATIVE CHOLANGIOGRAM;  Surgeon: Adin Hector, MD;  Location: [facility];  Service: General;  Laterality: N/A;  . CHOLECYSTECTOMY  03/19/2012   Procedure: CHOLECYSTECTOMY;  Surgeon: Adin Hector, MD;  Location: [facility];  Service: General;  Laterality: N/A;  converted to open @ 1137  . EYE SURGERY  02/2012   bilateral cataracts with lens implants  . FOOT NEUROMA SURGERY     right foot  . LIPOMA EXCISION    . 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 TUBAL LIGATION  1971    SOCIAL HISTORY: Social  History   Socioeconomic History  . Marital status: Widowed    Spouse name: Not on file  . Number of children: Not on file  . Years of education: Not on file  . Highest education level: Not on file  Occupational History  . Not on file  Tobacco Use  . Smoking status: Never Smoker  . Smokeless tobacco: Never Used  Vaping Use  . Vaping Use: Never used  Substance and Sexual Activity  . Alcohol use: Not Currently  . Drug use: No  . Sexual activity:  Not Currently    Birth control/protection: Post-menopausal  Other Topics Concern  . Not on file  Social History Narrative  . Not on file   Social Determinants of Health   Financial Resource Strain: Not on file  Food Insecurity: Not on file  Transportation Needs: Not on file  Physical Activity: Not on file  Stress: Not on file  Social Connections: Not on file  Intimate Partner Violence: Not on file    FAMILY HISTORY: Family History  Problem Relation Age of Onset  . Transient ischemic attack Mother   . Cancer Mother        breast  . Hypertension Mother   . Alzheimer's disease Mother   . Stroke Father   . Colon polyps Father   . Cancer Sister   . Cancer Paternal Grandfather        lung    ALLERGIES:  is allergic to crestor [rosuvastatin], sulfa antibiotics, ciprofloxacin, and requip [ropinirole].  MEDICATIONS:  Current Outpatient Medications  Medication Sig Dispense Refill  . acetaminophen (TYLENOL) 650 MG CR tablet Take 650 mg by mouth daily.     Marland Kitchen albuterol (PROVENTIL HFA;VENTOLIN HFA) 108 (90 BASE) MCG/ACT inhaler Inhale 2 puffs into the lungs every 6 (six) hours as needed. For shortness of breath    . amLODipine (NORVASC) 2.5 MG tablet Take 2.5 mg by mouth daily.    Marland Kitchen b complex vitamins tablet Take 1 tablet by mouth daily.    . cetirizine (ZYRTEC) 10 MG tablet Take 10 mg by mouth at bedtime.     . Cholecalciferol (VITAMIN D) 2000 UNITS tablet Take 2,000 Units by mouth at bedtime.     . Coenzyme Q10 (COQ10) 100 MG CAPS Take 1 capsule by mouth daily.    . Cranberry-Vitamin C-Vitamin E (CRANBERRY PLUS VITAMIN C) 4200-20-3 MG-MG-UNIT CAPS Take 2 capsules by mouth daily.    . famotidine (PEPCID) 20 MG tablet Take 40 mg by mouth as needed for heartburn or indigestion.    . fluticasone (FLONASE) 50 MCG/ACT nasal spray Place 2 sprays into the nose daily as needed. For allergies    . furosemide (LASIX) 40 MG tablet Take 40 mg by mouth as needed for fluid or edema.    .  hydrochlorothiazide (HYDRODIURIL) 12.5 MG tablet Take 12.5 mg by mouth every morning.    . Melatonin 12 MG TABS Take 1 tablet by mouth as needed.    . Multiple Vitamins-Minerals (CENTRUM SILVER 50+WOMEN PO) Take 1 tablet by mouth daily.    . pravastatin (PRAVACHOL) 40 MG tablet Take 40 mg by mouth daily.     . solifenacin (VESICARE) 5 MG tablet Take 10 mg by mouth daily.     . SYMBICORT 160-4.5 MCG/ACT inhaler Place 2 puffs into both nostrils 2 (two) times daily.    Marland Kitchen tiZANidine (ZANAFLEX) 2 MG tablet Take 2 mg by mouth at bedtime as needed. Restless legs (Patient not taking: Reported on 05/07/2020)    .  valsartan (DIOVAN) 320 MG tablet Take 320 mg by mouth daily.     No current facility-administered medications for this visit.    REVIEW OF SYSTEMS:    All other systems were reviewed with the patient and are negative.  PHYSICAL EXAMINATION: ECOG PERFORMANCE STATUS: 1 - Symptomatic but completely ambulatory  Vitals:   05/11/20 1243  BP: (!) 161/75  Pulse: 76  Resp: 18  Temp: (!) 97.5 F (36.4 C)  SpO2: 99%   Filed Weights   05/11/20 1243  Weight: 176 lb 8 oz (80.1 kg)     LABORATORY DATA:  I have reviewed the data as listed Lab Results  Component Value Date   WBC 8.3 03/23/2020   HGB 12.0 03/23/2020   HCT 36.7 03/23/2020   MCV 94.1 03/23/2020   PLT 342 03/23/2020   Lab Results  Component Value Date   NA 137 03/23/2020   K 4.6 03/23/2020   CL 104 03/23/2020   CO2 26 03/23/2020    RADIOGRAPHIC STUDIES: I have personally reviewed the radiological reports and agreed with the findings in the report.  ASSESSMENT AND PLAN:  Ductal carcinoma in situ (DCIS) of right breast 04/05/2020:Right lumpectomy ([facility]): DCIS, 2.0cm, involving a complex sclerosing lesion, clear margins.  ER 30%, PR 10%, Ki-67 5%   Recommendation: 1. Adjuvant radiation therapy 2. Followed by antiestrogen therapy with tamoxifen 5 years  Tamoxifen counseling: We discussed the risks and  benefits of tamoxifen. These include but not limited to insomnia, hot flashes, mood changes, vaginal dryness, and weight gain. Although rare, serious side effects including endometrial cancer, risk of blood clots were also discussed. We strongly believe that the benefits far outweigh the risks. Patient understands these risks and consented to starting treatment. Planned treatment duration is 5 years.   Return to clinic after radiation to start antiestrogen therapy   All questions were answered. The patient knows to call the clinic with any problems, questions or concerns.   Rulon Eisenmenger, MD, MPH 05/11/2020    I, Molly Dorshimer, am acting as scribe for Nicholas Lose, MD.  I have reviewed the above documentation for accuracy and completeness, and I agree with the above.

## 2020-05-11 ENCOUNTER — Inpatient Hospital Stay: Payer: Medicare Other | Attending: Hematology and Oncology | Admitting: Hematology and Oncology

## 2020-05-11 ENCOUNTER — Encounter: Payer: Self-pay | Admitting: *Deleted

## 2020-05-11 ENCOUNTER — Other Ambulatory Visit: Payer: Self-pay

## 2020-05-11 DIAGNOSIS — Z905 Acquired absence of kidney: Secondary | ICD-10-CM | POA: Diagnosis not present

## 2020-05-11 DIAGNOSIS — D0511 Intraductal carcinoma in situ of right breast: Secondary | ICD-10-CM

## 2020-05-11 DIAGNOSIS — Z820 Family history of epilepsy and other diseases of the nervous system: Secondary | ICD-10-CM | POA: Diagnosis not present

## 2020-05-11 DIAGNOSIS — Z823 Family history of stroke: Secondary | ICD-10-CM | POA: Insufficient documentation

## 2020-05-11 DIAGNOSIS — G4733 Obstructive sleep apnea (adult) (pediatric): Secondary | ICD-10-CM | POA: Diagnosis not present

## 2020-05-11 DIAGNOSIS — Z8371 Family history of colonic polyps: Secondary | ICD-10-CM | POA: Insufficient documentation

## 2020-05-11 DIAGNOSIS — Z881 Allergy status to other antibiotic agents status: Secondary | ICD-10-CM | POA: Insufficient documentation

## 2020-05-11 DIAGNOSIS — Z801 Family history of malignant neoplasm of trachea, bronchus and lung: Secondary | ICD-10-CM | POA: Diagnosis not present

## 2020-05-11 DIAGNOSIS — Z803 Family history of malignant neoplasm of breast: Secondary | ICD-10-CM | POA: Insufficient documentation

## 2020-05-11 DIAGNOSIS — Z9049 Acquired absence of other specified parts of digestive tract: Secondary | ICD-10-CM | POA: Insufficient documentation

## 2020-05-11 DIAGNOSIS — Z8249 Family history of ischemic heart disease and other diseases of the circulatory system: Secondary | ICD-10-CM | POA: Diagnosis not present

## 2020-05-11 DIAGNOSIS — Z882 Allergy status to sulfonamides status: Secondary | ICD-10-CM | POA: Insufficient documentation

## 2020-05-11 DIAGNOSIS — Z79899 Other long term (current) drug therapy: Secondary | ICD-10-CM | POA: Diagnosis not present

## 2020-05-11 DIAGNOSIS — Z8719 Personal history of other diseases of the digestive system: Secondary | ICD-10-CM | POA: Insufficient documentation

## 2020-05-11 NOTE — Assessment & Plan Note (Signed)
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  ER 30%, PR 10%, Ki-67 5%   Recommendation: 1. Adjuvant radiation therapy 2. Followed by antiestrogen therapy with tamoxifen 5 years  Return to clinic after radiation to start antiestrogen therapy

## 2020-05-12 ENCOUNTER — Encounter: Payer: Self-pay | Admitting: *Deleted

## 2020-05-12 NOTE — Progress Notes (Signed)
Radiation Oncology         [phone] ________________________________  Name: Mackenzie King MRN: [id number]  Date: 05/07/2020  DOB: 1940-09-20  CC:[name], MD  Erroll Luna, MD     REFERRING PHYSICIAN: Erroll Luna, MD   DIAGNOSIS: The encounter diagnosis was Ductal carcinoma in situ (DCIS) of right breast.   HISTORY OF PRESENT ILLNESS::Mackenzie King is a 79 y.o. female who is seen for an initial consultation visit regarding the patient's diagnosis of DCIS of the right breast.  The patient was found to have a suspicious area on screening mammogram and further mammography confirmed a 5 cm area of calcification within the upper outer quadrant.  A biopsy was performed and this revealed ADH.  It was felt that this was appropriate to remove this area and she underwent a lumpectomy on 03/26/2020.  Final pathology revealed a grade 2 DCIS measuring 2 cm.  Margins were negative with the closest margin being less than 1 mm focally in the superior and lateral aspects.  Receptor studies indicated that the tumor is estrogen receptor positive, progesterone receptor positive, and HER-2/neu negative.  The Ki-67 staining was 5%.  The patient is doing well in terms of recovery and is appropriate to proceed with an adjuvant course of radiation treatment.  We discussed a hypofractionated 4-week course of treatment.    PREVIOUS RADIATION THERAPY: No   PAST MEDICAL HISTORY:  has a past medical history of Abdominal distention, Asthma, Asymptomatic gallstones, Atypical ductal hyperplasia of right breast, Cancer ([place]) (12/12), CKD (chronic kidney disease), stage III ([place]), Colon polyps, Dermatitis, Dyslipidemia, Facet arthropathy, cervical, GERD (gastroesophageal reflux disease), Hearing loss, Hypertension, Nasal congestion, Obstructive sleep apnea, Osteoarthritis, Osteopenia, Plantar fasciitis, Pure hypercholesterolemia, Restless legs syndrome (RLS), and Wheezing.     PAST SURGICAL HISTORY: Past  Surgical History:  Procedure Laterality Date  . BREAST LUMPECTOMY WITH RADIOACTIVE SEED LOCALIZATION Right 03/26/2020   Procedure: RIGHT BREAST LUMPECTOMY WITH RADIOACTIVE SEED LOCALIZATION;  Surgeon: Erroll Luna, MD;  Location: [facility];  Service: General;  Laterality: Right;  . BREAST SURGERY  1996   breast biopsy - benign calcifications  . BREAST SURGERY  1977   adenofibroma removed - 3 biopsy's  . CHOLECYSTECTOMY  03/19/2012   Procedure: LAPAROSCOPIC CHOLECYSTECTOMY WITH INTRAOPERATIVE CHOLANGIOGRAM;  Surgeon: Adin Hector, MD;  Location[facility];  Service: General;  Laterality: N/A;  . CHOLECYSTECTOMY  03/19/2012   Procedure: CHOLECYSTECTOMY;  Surgeon: Adin Hector, MD;  Location[facility];  Service: General;  Laterality: N/A;  converted to open @ 1137  . EYE SURGERY  02/2012   bilateral cataracts with lens implants  . FOOT NEUROMA SURGERY     right foot  . LIPOMA EXCISION    . 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 TUBAL LIGATION  1971     FAMILY HISTORY: family history includes Alzheimer's disease in her mother; Cancer in her mother, paternal grandfather, and sister; Colon polyps in her father; Hypertension in her mother; Stroke in her father; Transient ischemic attack in her mother.   SOCIAL HISTORY:  reports that she has never smoked. She has never used smokeless tobacco. She reports previous alcohol use. She reports that she does not use drugs.   ALLERGIES: Crestor [rosuvastatin], Sulfa antibiotics, Ciprofloxacin, and Requip [ropinirole]   MEDICATIONS:  Current Outpatient Medications  Medication Sig Dispense Refill  . acetaminophen (TYLENOL) 650 MG CR tablet Take 650 mg by mouth  daily.     . albuterol (PROVENTIL HFA;VENTOLIN HFA) 108 (90 BASE) MCG/ACT inhaler Inhale 2 puffs into the lungs every 6 (six) hours as needed. For shortness of breath    . amLODipine (NORVASC) 2.5 MG tablet Take 2.5 mg by mouth  daily.    Marland Kitchen b complex vitamins tablet Take 1 tablet by mouth daily.    . cetirizine (ZYRTEC) 10 MG tablet Take 10 mg by mouth at bedtime.     . Cholecalciferol (VITAMIN D) 2000 UNITS tablet Take 2,000 Units by mouth at bedtime.     . Coenzyme Q10 (COQ10) 100 MG CAPS Take 1 capsule by mouth daily.    . Cranberry-Vitamin C-Vitamin E (CRANBERRY PLUS VITAMIN C) 4200-20-3 MG-MG-UNIT CAPS Take 2 capsules by mouth daily.    . famotidine (PEPCID) 20 MG tablet Take 40 mg by mouth as needed for heartburn or indigestion.    . fluticasone (FLONASE) 50 MCG/ACT nasal spray Place 2 sprays into the nose daily as needed. For allergies    . furosemide (LASIX) 40 MG tablet Take 40 mg by mouth as needed for fluid or edema.    . hydrochlorothiazide (HYDRODIURIL) 12.5 MG tablet Take 12.5 mg by mouth every morning.    . Melatonin 12 MG TABS Take 1 tablet by mouth as needed.    . Multiple Vitamins-Minerals (CENTRUM SILVER 50+WOMEN PO) Take 1 tablet by mouth daily.    . pravastatin (PRAVACHOL) 40 MG tablet Take 40 mg by mouth daily.     . solifenacin (VESICARE) 5 MG tablet Take 10 mg by mouth daily.     . SYMBICORT 160-4.5 MCG/ACT inhaler Place 2 puffs into both nostrils 2 (two) times daily.    . valsartan (DIOVAN) 320 MG tablet Take 320 mg by mouth daily.    Marland Kitchen tiZANidine (ZANAFLEX) 2 MG tablet Take 2 mg by mouth at bedtime as needed. Restless legs (Patient not taking: Reported on 05/07/2020)     No current facility-administered medications for this encounter.     REVIEW OF SYSTEMS:  A 15 point review of systems is documented in the electronic medical record. This was obtained by the nursing staff. However, I reviewed this with the patient to discuss relevant findings and make appropriate changes.  Pertinent items are noted in HPI.    PHYSICAL EXAM:  height is 5' 1.75" (1.568 m) and weight is 176 lb 9.6 oz (80.1 kg). Her temporal temperature is 97.5 F (36.4 C) (abnormal). Her blood pressure is 159/67 (abnormal)  and her pulse is 71. Her respiration is 18 and oxygen saturation is 99%.   ECOG = 1  0 - Asymptomatic (Fully active, able to carry on all predisease activities without restriction)  1 - Symptomatic but completely ambulatory (Restricted in physically strenuous activity but ambulatory and able to carry out work of a light or sedentary nature. For example, light housework, office work)  2 - Symptomatic, <50% in bed during the day (Ambulatory and capable of all self care but unable to carry out any work activities. Up and about more than 50% of waking hours)  3 - Symptomatic, >50% in bed, but not bedbound (Capable of only limited self-care, confined to bed or chair 50% or more of waking hours)  4 - Bedbound (Completely disabled. Cannot carry on any self-care. Totally confined to bed or chair)  5 - Death   Eustace Pen MM, Creech RH, Tormey DC, et al. [phone]). "Toxicity and response criteria of the [facility]". [place]  Oncol. 5 (6): 649-55  Alert, no distress   LABORATORY DATA:  Lab Results  Component Value Date   WBC 8.3 03/23/2020   HGB 12.0 03/23/2020   HCT 36.7 03/23/2020   MCV 94.1 03/23/2020   PLT 342 03/23/2020   Lab Results  Component Value Date   NA 137 03/23/2020   K 4.6 03/23/2020   CL 104 03/23/2020   CO2 26 03/23/2020   Lab Results  Component Value Date   ALT 16 03/23/2020   AST 17 03/23/2020   ALKPHOS 70 03/23/2020   BILITOT 0.6 03/23/2020      RADIOGRAPHY: No results found.     IMPRESSION/ PLAN:  The patient is status post lumpectomy for a right-sided DCIS.  She is appropriate at this time for a 4-week, hypofractionated course of radiation treatment to the right breast with whole breast tangent fields.  We discussed the rationale of treatment as well as side effects and risks.  All of the patient's questions were answered.  She will be scheduled for simulation in the near future for treatment planning.      The patient was seen in  person today in clinic.  The total time spent on the patient's visit today was 40 minutes, including chart review, direct discussion/evaluation with the patient, and coordination of care.   ________________________________   Jodelle Gross, MD, PhD   **Disclaimer: This note was dictated with voice recognition software. Similar sounding words can inadvertently be transcribed and this note may contain transcription errors which may not have been corrected upon publication of note.**

## 2020-05-14 ENCOUNTER — Telehealth: Payer: Self-pay | Admitting: Hematology and Oncology

## 2020-05-14 NOTE — Telephone Encounter (Signed)
No 12/20 los, no changes made to pt schedule  

## 2020-05-19 ENCOUNTER — Encounter: Payer: Self-pay | Admitting: *Deleted

## 2020-05-20 ENCOUNTER — Ambulatory Visit: Payer: Medicare Other | Admitting: Radiation Oncology

## 2020-05-21 ENCOUNTER — Ambulatory Visit
Admission: RE | Admit: 2020-05-21 | Discharge: 2020-05-21 | Disposition: A | Discharge status: Home / Self Care | Payer: Medicare Other | Source: Ambulatory Visit | Attending: Radiation Oncology | Admitting: Radiation Oncology

## 2020-05-21 DIAGNOSIS — D0511 Intraductal carcinoma in situ of right breast: Secondary | ICD-10-CM | POA: Diagnosis not present

## 2020-05-21 DIAGNOSIS — Z170 Estrogen receptor positive status [ER+]: Secondary | ICD-10-CM | POA: Diagnosis not present

## 2020-05-26 ENCOUNTER — Ambulatory Visit: Payer: Medicare Other | Admitting: Radiation Oncology

## 2020-05-27 ENCOUNTER — Ambulatory Visit: Payer: Medicare Other

## 2020-05-27 ENCOUNTER — Ambulatory Visit: Payer: Medicare Other | Admitting: Radiation Oncology

## 2020-05-27 DIAGNOSIS — Z170 Estrogen receptor positive status [ER+]: Secondary | ICD-10-CM | POA: Diagnosis not present

## 2020-05-27 DIAGNOSIS — D0511 Intraductal carcinoma in situ of right breast: Secondary | ICD-10-CM | POA: Insufficient documentation

## 2020-05-28 ENCOUNTER — Ambulatory Visit: Payer: Medicare Other

## 2020-05-28 ENCOUNTER — Other Ambulatory Visit: Payer: Self-pay

## 2020-05-28 ENCOUNTER — Ambulatory Visit
Admission: RE | Admit: 2020-05-28 | Discharge: 2020-05-28 | Disposition: A | Discharge status: Home / Self Care | Payer: Medicare Other | Source: Ambulatory Visit | Attending: Radiation Oncology | Admitting: Radiation Oncology

## 2020-05-28 DIAGNOSIS — H52213 Irregular astigmatism, bilateral: Secondary | ICD-10-CM | POA: Diagnosis not present

## 2020-05-28 DIAGNOSIS — D0511 Intraductal carcinoma in situ of right breast: Secondary | ICD-10-CM | POA: Diagnosis not present

## 2020-05-28 DIAGNOSIS — H18593 Other hereditary corneal dystrophies, bilateral: Secondary | ICD-10-CM | POA: Diagnosis not present

## 2020-05-28 DIAGNOSIS — Z170 Estrogen receptor positive status [ER+]: Secondary | ICD-10-CM | POA: Diagnosis not present

## 2020-05-28 DIAGNOSIS — H524 Presbyopia: Secondary | ICD-10-CM | POA: Diagnosis not present

## 2020-05-28 DIAGNOSIS — H04123 Dry eye syndrome of bilateral lacrimal glands: Secondary | ICD-10-CM | POA: Diagnosis not present

## 2020-05-29 ENCOUNTER — Ambulatory Visit
Admission: RE | Admit: 2020-05-29 | Discharge: 2020-05-29 | Disposition: A | Discharge status: Home / Self Care | Payer: Medicare Other | Source: Ambulatory Visit | Attending: Radiation Oncology | Admitting: Radiation Oncology

## 2020-05-29 ENCOUNTER — Other Ambulatory Visit: Payer: Self-pay

## 2020-05-29 DIAGNOSIS — Z170 Estrogen receptor positive status [ER+]: Secondary | ICD-10-CM | POA: Diagnosis not present

## 2020-05-29 DIAGNOSIS — D0511 Intraductal carcinoma in situ of right breast: Secondary | ICD-10-CM

## 2020-05-29 MED ORDER — RADIAPLEXRX EX GEL: Freq: Once | CUTANEOUS | Status: AC

## 2020-05-29 MED ORDER — ALRA NON-METALLIC DEODORANT (RAD-ONC)
1.0000 "application " | Freq: Once | TOPICAL | Status: AC
  Administered 2020-05-29: 1 via TOPICAL

## 2020-05-29 NOTE — Progress Notes (Signed)
Pt here for patient teaching.  Pt given Radiation and You booklet, skin care instructions, Alra deodorant and Radiaplex gel.  Reviewed areas of pertinence such as fatigue, hair loss, skin changes, breast tenderness and breast swelling . Pt able to give teach back of to pat skin and use unscented/gentle soap,apply Radiaplex bid, avoid applying anything to skin within 4 hours of treatment, avoid wearing an under wire bra and to use an electric razor if they must shave. Pt verbalizes understanding of information given and will contact nursing with any questions or concerns.     Vylet Maffia M. Armstrong Creasy RN, BSN      

## 2020-06-01 ENCOUNTER — Telehealth: Payer: Self-pay | Admitting: Hematology and Oncology

## 2020-06-01 ENCOUNTER — Other Ambulatory Visit: Payer: Self-pay

## 2020-06-01 ENCOUNTER — Encounter: Payer: Self-pay | Admitting: *Deleted

## 2020-06-01 ENCOUNTER — Ambulatory Visit
Admission: RE | Admit: 2020-06-01 | Discharge: 2020-06-01 | Disposition: A | Discharge status: Home / Self Care | Payer: Medicare Other | Source: Ambulatory Visit | Attending: Radiation Oncology | Admitting: Radiation Oncology

## 2020-06-01 DIAGNOSIS — D0511 Intraductal carcinoma in situ of right breast: Secondary | ICD-10-CM | POA: Diagnosis not present

## 2020-06-01 DIAGNOSIS — Z170 Estrogen receptor positive status [ER+]: Secondary | ICD-10-CM | POA: Diagnosis not present

## 2020-06-01 NOTE — Telephone Encounter (Signed)
Scheduled appt per 1/10 sch msg - mailed letter with appt date and time   

## 2020-06-02 ENCOUNTER — Other Ambulatory Visit: Payer: Self-pay

## 2020-06-02 ENCOUNTER — Ambulatory Visit
Admission: RE | Admit: 2020-06-02 | Discharge: 2020-06-02 | Disposition: A | Discharge status: Home / Self Care | Payer: Medicare Other | Source: Ambulatory Visit | Attending: Radiation Oncology | Admitting: Radiation Oncology

## 2020-06-02 DIAGNOSIS — D0511 Intraductal carcinoma in situ of right breast: Secondary | ICD-10-CM | POA: Diagnosis not present

## 2020-06-02 DIAGNOSIS — Z170 Estrogen receptor positive status [ER+]: Secondary | ICD-10-CM | POA: Diagnosis not present

## 2020-06-03 ENCOUNTER — Other Ambulatory Visit: Payer: Self-pay

## 2020-06-03 ENCOUNTER — Ambulatory Visit
Admission: RE | Admit: 2020-06-03 | Discharge: 2020-06-03 | Disposition: A | Discharge status: Home / Self Care | Payer: Medicare Other | Source: Ambulatory Visit | Attending: Radiation Oncology | Admitting: Radiation Oncology

## 2020-06-03 DIAGNOSIS — Z85528 Personal history of other malignant neoplasm of kidney: Secondary | ICD-10-CM | POA: Diagnosis not present

## 2020-06-03 DIAGNOSIS — Z170 Estrogen receptor positive status [ER+]: Secondary | ICD-10-CM | POA: Diagnosis not present

## 2020-06-03 DIAGNOSIS — R350 Frequency of micturition: Secondary | ICD-10-CM | POA: Diagnosis not present

## 2020-06-03 DIAGNOSIS — D0511 Intraductal carcinoma in situ of right breast: Secondary | ICD-10-CM | POA: Diagnosis not present

## 2020-06-04 ENCOUNTER — Ambulatory Visit
Admission: RE | Admit: 2020-06-04 | Discharge: 2020-06-04 | Disposition: A | Discharge status: Home / Self Care | Payer: Medicare Other | Source: Ambulatory Visit | Attending: Radiation Oncology | Admitting: Radiation Oncology

## 2020-06-04 ENCOUNTER — Other Ambulatory Visit: Payer: Self-pay

## 2020-06-04 DIAGNOSIS — D0511 Intraductal carcinoma in situ of right breast: Secondary | ICD-10-CM | POA: Diagnosis not present

## 2020-06-04 DIAGNOSIS — Z170 Estrogen receptor positive status [ER+]: Secondary | ICD-10-CM | POA: Diagnosis not present

## 2020-06-05 ENCOUNTER — Encounter: Payer: Self-pay | Admitting: Radiation Oncology

## 2020-06-05 ENCOUNTER — Ambulatory Visit
Admission: RE | Admit: 2020-06-05 | Discharge: 2020-06-05 | Disposition: A | Discharge status: Home / Self Care | Payer: Medicare Other | Source: Ambulatory Visit | Attending: Radiation Oncology | Admitting: Radiation Oncology

## 2020-06-05 ENCOUNTER — Other Ambulatory Visit: Payer: Self-pay

## 2020-06-05 ENCOUNTER — Encounter: Payer: Self-pay | Admitting: *Deleted

## 2020-06-05 DIAGNOSIS — D0511 Intraductal carcinoma in situ of right breast: Secondary | ICD-10-CM | POA: Diagnosis not present

## 2020-06-05 DIAGNOSIS — Z170 Estrogen receptor positive status [ER+]: Secondary | ICD-10-CM | POA: Diagnosis not present

## 2020-06-08 ENCOUNTER — Ambulatory Visit: Payer: Medicare Other

## 2020-06-09 ENCOUNTER — Ambulatory Visit
Admission: RE | Admit: 2020-06-09 | Discharge: 2020-06-09 | Disposition: A | Discharge status: Home / Self Care | Payer: Medicare Other | Source: Ambulatory Visit | Attending: Radiation Oncology | Admitting: Radiation Oncology

## 2020-06-09 DIAGNOSIS — D0511 Intraductal carcinoma in situ of right breast: Secondary | ICD-10-CM | POA: Diagnosis not present

## 2020-06-09 DIAGNOSIS — Z170 Estrogen receptor positive status [ER+]: Secondary | ICD-10-CM | POA: Diagnosis not present

## 2020-06-10 ENCOUNTER — Ambulatory Visit
Admission: RE | Admit: 2020-06-10 | Discharge: 2020-06-10 | Disposition: A | Discharge status: Home / Self Care | Payer: Medicare Other | Source: Ambulatory Visit | Attending: Radiation Oncology | Admitting: Radiation Oncology

## 2020-06-10 ENCOUNTER — Other Ambulatory Visit: Payer: Self-pay

## 2020-06-10 DIAGNOSIS — D0511 Intraductal carcinoma in situ of right breast: Secondary | ICD-10-CM | POA: Diagnosis not present

## 2020-06-10 DIAGNOSIS — Z170 Estrogen receptor positive status [ER+]: Secondary | ICD-10-CM | POA: Diagnosis not present

## 2020-06-11 ENCOUNTER — Other Ambulatory Visit: Payer: Self-pay

## 2020-06-11 ENCOUNTER — Ambulatory Visit
Admission: RE | Admit: 2020-06-11 | Discharge: 2020-06-11 | Disposition: A | Discharge status: Home / Self Care | Payer: Medicare Other | Source: Ambulatory Visit | Attending: Radiation Oncology | Admitting: Radiation Oncology

## 2020-06-11 DIAGNOSIS — Z170 Estrogen receptor positive status [ER+]: Secondary | ICD-10-CM | POA: Diagnosis not present

## 2020-06-11 DIAGNOSIS — D0511 Intraductal carcinoma in situ of right breast: Secondary | ICD-10-CM | POA: Diagnosis not present

## 2020-06-12 ENCOUNTER — Ambulatory Visit
Admission: RE | Admit: 2020-06-12 | Discharge: 2020-06-12 | Disposition: A | Discharge status: Home / Self Care | Payer: Medicare Other | Source: Ambulatory Visit | Attending: Radiation Oncology | Admitting: Radiation Oncology

## 2020-06-12 ENCOUNTER — Other Ambulatory Visit: Payer: Self-pay

## 2020-06-12 DIAGNOSIS — E785 Hyperlipidemia, unspecified: Secondary | ICD-10-CM | POA: Diagnosis not present

## 2020-06-12 DIAGNOSIS — Z170 Estrogen receptor positive status [ER+]: Secondary | ICD-10-CM | POA: Diagnosis not present

## 2020-06-12 DIAGNOSIS — K219 Gastro-esophageal reflux disease without esophagitis: Secondary | ICD-10-CM | POA: Diagnosis not present

## 2020-06-12 DIAGNOSIS — J4520 Mild intermittent asthma, uncomplicated: Secondary | ICD-10-CM | POA: Diagnosis not present

## 2020-06-12 DIAGNOSIS — D0511 Intraductal carcinoma in situ of right breast: Secondary | ICD-10-CM | POA: Diagnosis not present

## 2020-06-12 DIAGNOSIS — I129 Hypertensive chronic kidney disease with stage 1 through stage 4 chronic kidney disease, or unspecified chronic kidney disease: Secondary | ICD-10-CM | POA: Diagnosis not present

## 2020-06-12 DIAGNOSIS — N183 Chronic kidney disease, stage 3 unspecified: Secondary | ICD-10-CM | POA: Diagnosis not present

## 2020-06-12 DIAGNOSIS — F324 Major depressive disorder, single episode, in partial remission: Secondary | ICD-10-CM | POA: Diagnosis not present

## 2020-06-15 ENCOUNTER — Ambulatory Visit
Admission: RE | Admit: 2020-06-15 | Discharge: 2020-06-15 | Disposition: A | Discharge status: Home / Self Care | Payer: Medicare Other | Source: Ambulatory Visit | Attending: Radiation Oncology | Admitting: Radiation Oncology

## 2020-06-15 ENCOUNTER — Other Ambulatory Visit: Payer: Self-pay

## 2020-06-15 DIAGNOSIS — D0511 Intraductal carcinoma in situ of right breast: Secondary | ICD-10-CM | POA: Diagnosis not present

## 2020-06-15 DIAGNOSIS — Z170 Estrogen receptor positive status [ER+]: Secondary | ICD-10-CM | POA: Diagnosis not present

## 2020-06-16 ENCOUNTER — Ambulatory Visit
Admission: RE | Admit: 2020-06-16 | Discharge: 2020-06-16 | Disposition: A | Discharge status: Home / Self Care | Payer: Medicare Other | Source: Ambulatory Visit | Attending: Radiation Oncology | Admitting: Radiation Oncology

## 2020-06-16 DIAGNOSIS — D0511 Intraductal carcinoma in situ of right breast: Secondary | ICD-10-CM | POA: Diagnosis not present

## 2020-06-16 DIAGNOSIS — Z170 Estrogen receptor positive status [ER+]: Secondary | ICD-10-CM | POA: Diagnosis not present

## 2020-06-17 ENCOUNTER — Other Ambulatory Visit: Payer: Self-pay

## 2020-06-17 ENCOUNTER — Ambulatory Visit
Admission: RE | Admit: 2020-06-17 | Discharge: 2020-06-17 | Disposition: A | Discharge status: Home / Self Care | Payer: Medicare Other | Source: Ambulatory Visit | Attending: Radiation Oncology | Admitting: Radiation Oncology

## 2020-06-17 DIAGNOSIS — D0511 Intraductal carcinoma in situ of right breast: Secondary | ICD-10-CM | POA: Diagnosis not present

## 2020-06-17 DIAGNOSIS — Z170 Estrogen receptor positive status [ER+]: Secondary | ICD-10-CM | POA: Diagnosis not present

## 2020-06-17 NOTE — Progress Notes (Signed)
Patient Care Team: [name], MD as PCP - General (Family Medicine) Sueanne Margarita, MD as PCP - Cardiology (Cardiology) Mauro Kaufmann, RN as Oncology Nurse Navigator Rockwell Germany, RN as Oncology Nurse Navigator  DIAGNOSIS:    ICD-10-CM   1. Ductal carcinoma in situ (DCIS) of right breast  D05.11     SUMMARY OF ONCOLOGIC HISTORY: Oncology History  Ductal carcinoma in situ (DCIS) of right breast  12/12/2019 Initial Diagnosis   Screening mammogram showed right breast calcifications spanning 5.0cm. Biopsy showed atypical ductal hyperplasia.  ER 70%, PR 10%, Ki-67 5%   03/26/2020 Surgery   Right lumpectomy ([facility]): DCIS, 2.0cm, involving a complex sclerosing lesion, clear margins.   05/11/2020 Cancer Staging   Staging form: Breast, AJCC 8th Edition - Clinical: Stage 0 (cTis (DCIS), cN0, cM0, G2, ER+, PR+, HER2: Not Assessed) - Signed by Nicholas Lose, MD on 05/11/2020   05/29/2020 -  Radiation Therapy   Adjuvant radiation     CHIEF COMPLIANT: Follow-up to discuss antiestrogen therapy  INTERVAL HISTORY: Mackenzie King is a 80 y.o. with above-mentioned history of right breast DCIS who underwent a lumpectomy and is currently on radiation therapy. She presents to the clinic today to discuss antiestrogen therapy.  She is tolerating radiation extremely well without any problems or concerns.  She has mild radiation dermatitis.  ALLERGIES:  is allergic to crestor [rosuvastatin], sulfa antibiotics, ciprofloxacin, and requip [ropinirole].  MEDICATIONS:  Current Outpatient Medications  Medication Sig Dispense Refill  . tamoxifen (NOLVADEX) 20 MG tablet Take 1 tablet (20 mg total) by mouth daily. 90 tablet 3  . acetaminophen (TYLENOL) 650 MG CR tablet Take 650 mg by mouth daily.     Marland Kitchen albuterol (PROVENTIL HFA;VENTOLIN HFA) 108 (90 BASE) MCG/ACT inhaler Inhale 2 puffs into the lungs every 6 (six) hours as needed. For shortness of breath    . amLODipine (NORVASC) 2.5 MG tablet  Take 2.5 mg by mouth daily.    Marland Kitchen b complex vitamins tablet Take 1 tablet by mouth daily.    . cetirizine (ZYRTEC) 10 MG tablet Take 10 mg by mouth at bedtime.     . Cholecalciferol (VITAMIN D) 2000 UNITS tablet Take 2,000 Units by mouth at bedtime.     . Coenzyme Q10 (COQ10) 100 MG CAPS Take 1 capsule by mouth daily.    . Cranberry-Vitamin C-Vitamin E (CRANBERRY PLUS VITAMIN C) 4200-20-3 MG-MG-UNIT CAPS Take 2 capsules by mouth daily.    . famotidine (PEPCID) 20 MG tablet Take 40 mg by mouth as needed for heartburn or indigestion.    . fluticasone (FLONASE) 50 MCG/ACT nasal spray Place 2 sprays into the nose daily as needed. For allergies    . furosemide (LASIX) 40 MG tablet Take 40 mg by mouth as needed for fluid or edema.    . hydrochlorothiazide (HYDRODIURIL) 12.5 MG tablet Take 12.5 mg by mouth every morning.    . Melatonin 12 MG TABS Take 1 tablet by mouth as needed.    . Multiple Vitamins-Minerals (CENTRUM SILVER 50+WOMEN PO) Take 1 tablet by mouth daily.    . pravastatin (PRAVACHOL) 40 MG tablet Take 40 mg by mouth daily.     . solifenacin (VESICARE) 5 MG tablet Take 10 mg by mouth daily.     . SYMBICORT 160-4.5 MCG/ACT inhaler Place 2 puffs into both nostrils 2 (two) times daily.    Marland Kitchen tiZANidine (ZANAFLEX) 2 MG tablet Take 2 mg by mouth at bedtime as needed. Restless legs (Patient  not taking: Reported on 05/07/2020)    . valsartan (DIOVAN) 320 MG tablet Take 320 mg by mouth daily.     No current facility-administered medications for this visit.    PHYSICAL EXAMINATION: ECOG PERFORMANCE STATUS: 1 - Symptomatic but completely ambulatory  Vitals:   06/18/20 1329  BP: 130/64  Pulse: 66  Resp: 18  Temp: 98.1 F (36.7 C)  SpO2: 98%   Filed Weights   06/18/20 1329  Weight: 173 lb 8 oz (78.7 kg)     LABORATORY DATA:  I have reviewed the data as listed CMP Latest Ref Rng & Units 03/23/2020 03/20/2012 03/14/2012  Glucose 70 - 99 mg/dL 89 121(H) 97  BUN 8 - 23 mg/dL 12 13 16    Creatinine 0.44 - 1.00 mg/dL 1.01(H) 1.02 1.08  Sodium 135 - 145 mmol/L 137 133(L) 136  Potassium 3.5 - 5.1 mmol/L 4.6 5.2(H) 4.8  Chloride 98 - 111 mmol/L 104 100 100  CO2 22 - 32 mmol/L 26 26 28   Calcium 8.9 - 10.3 mg/dL 9.2 8.4 9.7  Total Protein 6.5 - 8.1 g/dL 5.7(L) 5.2(L) 6.9  Total Bilirubin 0.3 - 1.2 mg/dL 0.6 0.3 0.3  Alkaline Phos 38 - 126 U/L 70 77 111  AST 15 - 41 U/L 17 17 17   ALT 0 - 44 U/L 16 11 12     Lab Results  Component Value Date   WBC 8.3 03/23/2020   HGB 12.0 03/23/2020   HCT 36.7 03/23/2020   MCV 94.1 03/23/2020   PLT 342 03/23/2020   NEUTROABS 4.2 03/23/2020    ASSESSMENT & PLAN:  Ductal carcinoma in situ (DCIS) of right breast 04/05/2020:Right lumpectomy ([facility]): DCIS, 2.0cm, involving a complex sclerosing lesion, clear margins.  ER 30%, PR 10%, Ki-67 5%   Recommendation: 1. Adjuvant radiation therapy 05/29/2020-06/25/2020 2. Followed by antiestrogen therapy with tamoxifen 5 years  Tamoxifen counseling: Once again discussed the risks and benefits of tamoxifen.  Return to clinic in 3 months for survivorship care plan visit.    No orders of the defined types were placed in this encounter.  The patient has a good understanding of the overall plan. she agrees with it. she will call with any problems that may develop before the next visit here.  Total time spent: 20 mins including face to face time and time spent for planning, charting and coordination of care  Nicholas Lose, MD 06/18/2020  I, Cloyde Reams Dorshimer, am acting as scribe for Dr. Nicholas Lose.  I have reviewed the above documentation for accuracy and completeness, and I agree with the above.

## 2020-06-18 ENCOUNTER — Ambulatory Visit: Payer: Medicare Other

## 2020-06-18 ENCOUNTER — Ambulatory Visit
Admission: RE | Admit: 2020-06-18 | Discharge: 2020-06-18 | Disposition: A | Discharge status: Home / Self Care | Payer: Medicare Other | Source: Ambulatory Visit | Attending: Radiation Oncology | Admitting: Radiation Oncology

## 2020-06-18 ENCOUNTER — Other Ambulatory Visit: Payer: Self-pay

## 2020-06-18 ENCOUNTER — Inpatient Hospital Stay (HOSPITAL_BASED_OUTPATIENT_CLINIC_OR_DEPARTMENT_OTHER): Payer: Medicare Other | Admitting: Hematology and Oncology

## 2020-06-18 DIAGNOSIS — Z881 Allergy status to other antibiotic agents status: Secondary | ICD-10-CM | POA: Insufficient documentation

## 2020-06-18 DIAGNOSIS — D0511 Intraductal carcinoma in situ of right breast: Secondary | ICD-10-CM

## 2020-06-18 DIAGNOSIS — Z923 Personal history of irradiation: Secondary | ICD-10-CM | POA: Insufficient documentation

## 2020-06-18 DIAGNOSIS — N6489 Other specified disorders of breast: Secondary | ICD-10-CM | POA: Insufficient documentation

## 2020-06-18 DIAGNOSIS — Z170 Estrogen receptor positive status [ER+]: Secondary | ICD-10-CM | POA: Diagnosis not present

## 2020-06-18 DIAGNOSIS — Z79899 Other long term (current) drug therapy: Secondary | ICD-10-CM | POA: Insufficient documentation

## 2020-06-18 DIAGNOSIS — Z882 Allergy status to sulfonamides status: Secondary | ICD-10-CM | POA: Insufficient documentation

## 2020-06-18 MED ORDER — TAMOXIFEN CITRATE 20 MG PO TABS: 20.0000 mg | ORAL_TABLET | Freq: Every day | ORAL | 3 refills | Status: DC

## 2020-06-18 NOTE — Assessment & Plan Note (Addendum)
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  ER 30%, PR 10%, Ki-67 5%   Recommendation: 1. Adjuvant radiation therapy 05/29/2020-06/25/2020 2. Followed by antiestrogen therapy with tamoxifen 5 years  Tamoxifen counseling: Once again discussed the risks and benefits of tamoxifen.  Return to clinic in 3 months for survivorship care plan visit.

## 2020-06-19 ENCOUNTER — Ambulatory Visit: Payer: Medicare Other

## 2020-06-19 ENCOUNTER — Other Ambulatory Visit: Payer: Self-pay

## 2020-06-19 ENCOUNTER — Ambulatory Visit
Admission: RE | Admit: 2020-06-19 | Discharge: 2020-06-19 | Disposition: A | Discharge status: Home / Self Care | Payer: Medicare Other | Source: Ambulatory Visit | Attending: Radiation Oncology | Admitting: Radiation Oncology

## 2020-06-19 DIAGNOSIS — Z170 Estrogen receptor positive status [ER+]: Secondary | ICD-10-CM | POA: Diagnosis not present

## 2020-06-19 DIAGNOSIS — D0511 Intraductal carcinoma in situ of right breast: Secondary | ICD-10-CM | POA: Diagnosis not present

## 2020-06-22 ENCOUNTER — Other Ambulatory Visit: Payer: Self-pay

## 2020-06-22 ENCOUNTER — Ambulatory Visit
Admission: RE | Admit: 2020-06-22 | Discharge: 2020-06-22 | Disposition: A | Discharge status: Home / Self Care | Payer: Medicare Other | Source: Ambulatory Visit | Attending: Radiation Oncology | Admitting: Radiation Oncology

## 2020-06-22 ENCOUNTER — Ambulatory Visit: Payer: Medicare Other

## 2020-06-22 DIAGNOSIS — D0511 Intraductal carcinoma in situ of right breast: Secondary | ICD-10-CM | POA: Diagnosis not present

## 2020-06-23 ENCOUNTER — Encounter: Payer: Self-pay | Admitting: *Deleted

## 2020-06-23 ENCOUNTER — Other Ambulatory Visit: Payer: Self-pay

## 2020-06-23 ENCOUNTER — Ambulatory Visit
Admission: RE | Admit: 2020-06-23 | Discharge: 2020-06-23 | Disposition: A | Discharge status: Home / Self Care | Payer: Medicare Other | Source: Ambulatory Visit | Attending: Radiation Oncology | Admitting: Radiation Oncology

## 2020-06-23 ENCOUNTER — Ambulatory Visit: Payer: Medicare Other

## 2020-06-23 DIAGNOSIS — D0511 Intraductal carcinoma in situ of right breast: Secondary | ICD-10-CM | POA: Insufficient documentation

## 2020-06-24 ENCOUNTER — Ambulatory Visit
Admission: RE | Admit: 2020-06-24 | Discharge: 2020-06-24 | Disposition: A | Discharge status: Home / Self Care | Payer: Medicare Other | Source: Ambulatory Visit | Attending: Radiation Oncology | Admitting: Radiation Oncology

## 2020-06-24 ENCOUNTER — Ambulatory Visit: Payer: Medicare Other

## 2020-06-24 DIAGNOSIS — D0511 Intraductal carcinoma in situ of right breast: Secondary | ICD-10-CM | POA: Diagnosis not present

## 2020-06-25 ENCOUNTER — Other Ambulatory Visit: Payer: Self-pay

## 2020-06-25 ENCOUNTER — Encounter: Payer: Self-pay | Admitting: *Deleted

## 2020-06-25 ENCOUNTER — Ambulatory Visit
Admission: RE | Admit: 2020-06-25 | Discharge: 2020-06-25 | Disposition: A | Discharge status: Home / Self Care | Payer: Medicare Other | Source: Ambulatory Visit | Attending: Radiation Oncology | Admitting: Radiation Oncology

## 2020-06-25 DIAGNOSIS — Z170 Estrogen receptor positive status [ER+]: Secondary | ICD-10-CM | POA: Diagnosis not present

## 2020-06-25 DIAGNOSIS — D0511 Intraductal carcinoma in situ of right breast: Secondary | ICD-10-CM | POA: Diagnosis not present

## 2020-06-26 NOTE — Progress Notes (Signed)
  Radiation Oncology         [phone] ________________________________  Name: Mackenzie King MRN: [id number]  Date: 06/05/2020  DOB: 03-22-1941  SIMULATION NOTE   NARRATIVE:  The patient underwent simulation today for ongoing radiation therapy.  The existing CT study set was employed for the purpose of virtual treatment planning.  The target and avoidance structures were reviewed and modified as necessary.  Treatment planning then occurred.  The radiation boost prescription was entered and confirmed.  A total of 1 complex treatment devices were fabricated in the form of multi-leaf collimators to shape radiation around the targets while maximally excluding nearby normal structures. I have requested : Isodose Plan.    PLAN:  This modified radiation beam arrangement is intended to continue the current radiation dose to an additional 10 Gy in 4 fractions for a total cumulative dose of 52.56 Gy.    ------------------------------------------------  Jodelle Gross, MD, PhD

## 2020-06-29 ENCOUNTER — Encounter: Payer: Self-pay | Admitting: Radiation Oncology

## 2020-06-29 NOTE — Progress Notes (Signed)
  Patient Name: Mackenzie King MRN: [id number] DOB: [birth year] Referring Physician: Erroll Luna (Profile Not Attached) Date of Service: 06/25/2020 [facility], [place]                                                        End Of Treatment Note  Diagnoses: D05.11-Intraductal carcinoma in situ of right breast  Cancer Staging: ER/PR positive Intermediate Grade DCIS of the right breast.  Intent: Curative  Radiation Treatment Dates: 05/28/2020 through 06/25/2020 Site Technique Total Dose (Gy) Dose per Fx (Gy) Completed Fx Beam Energies  Breast, Right: Breast_Rt 3D 42.56/42.56 2.66 16/16 6X, 10X  Breast, Right: Breast_Rt_Bst specialPort 10/10 2.5 4/4 12E, 15E   Narrative: The patient tolerated radiation therapy relatively well. She did have mild anticipated skin changes consistent with treatment.  Plan: The patient will receive a one month follow-up call with radiation oncology. She will also follow up with Dr. Lindi Adie in medical oncology for ongoing care.  ________________________________________________    Carola Rhine, PAC

## 2020-07-13 DIAGNOSIS — F324 Major depressive disorder, single episode, in partial remission: Secondary | ICD-10-CM | POA: Diagnosis not present

## 2020-07-13 DIAGNOSIS — E785 Hyperlipidemia, unspecified: Secondary | ICD-10-CM | POA: Diagnosis not present

## 2020-07-13 DIAGNOSIS — K219 Gastro-esophageal reflux disease without esophagitis: Secondary | ICD-10-CM | POA: Diagnosis not present

## 2020-07-13 DIAGNOSIS — I129 Hypertensive chronic kidney disease with stage 1 through stage 4 chronic kidney disease, or unspecified chronic kidney disease: Secondary | ICD-10-CM | POA: Diagnosis not present

## 2020-07-13 DIAGNOSIS — N183 Chronic kidney disease, stage 3 unspecified: Secondary | ICD-10-CM | POA: Diagnosis not present

## 2020-07-13 DIAGNOSIS — J4520 Mild intermittent asthma, uncomplicated: Secondary | ICD-10-CM | POA: Diagnosis not present

## 2020-07-27 DIAGNOSIS — D0510 Intraductal carcinoma in situ of unspecified breast: Secondary | ICD-10-CM | POA: Diagnosis not present

## 2020-07-31 DIAGNOSIS — R300 Dysuria: Secondary | ICD-10-CM | POA: Diagnosis not present

## 2020-08-11 DIAGNOSIS — J4520 Mild intermittent asthma, uncomplicated: Secondary | ICD-10-CM | POA: Diagnosis not present

## 2020-08-11 DIAGNOSIS — I129 Hypertensive chronic kidney disease with stage 1 through stage 4 chronic kidney disease, or unspecified chronic kidney disease: Secondary | ICD-10-CM | POA: Diagnosis not present

## 2020-08-11 DIAGNOSIS — F324 Major depressive disorder, single episode, in partial remission: Secondary | ICD-10-CM | POA: Diagnosis not present

## 2020-08-11 DIAGNOSIS — E785 Hyperlipidemia, unspecified: Secondary | ICD-10-CM | POA: Diagnosis not present

## 2020-08-11 DIAGNOSIS — K219 Gastro-esophageal reflux disease without esophagitis: Secondary | ICD-10-CM | POA: Diagnosis not present

## 2020-08-15 DIAGNOSIS — R350 Frequency of micturition: Secondary | ICD-10-CM | POA: Diagnosis not present

## 2020-08-15 DIAGNOSIS — N390 Urinary tract infection, site not specified: Secondary | ICD-10-CM | POA: Diagnosis not present

## 2020-08-25 DIAGNOSIS — Z85528 Personal history of other malignant neoplasm of kidney: Secondary | ICD-10-CM | POA: Diagnosis not present

## 2020-08-25 DIAGNOSIS — N390 Urinary tract infection, site not specified: Secondary | ICD-10-CM | POA: Diagnosis not present

## 2020-08-25 DIAGNOSIS — Z905 Acquired absence of kidney: Secondary | ICD-10-CM | POA: Diagnosis not present

## 2020-09-14 DIAGNOSIS — R350 Frequency of micturition: Secondary | ICD-10-CM | POA: Diagnosis not present

## 2020-09-14 DIAGNOSIS — R300 Dysuria: Secondary | ICD-10-CM | POA: Diagnosis not present

## 2020-09-14 DIAGNOSIS — R8279 Other abnormal findings on microbiological examination of urine: Secondary | ICD-10-CM | POA: Diagnosis not present

## 2020-09-16 DIAGNOSIS — D0511 Intraductal carcinoma in situ of right breast: Secondary | ICD-10-CM | POA: Diagnosis not present

## 2020-09-16 DIAGNOSIS — R7303 Prediabetes: Secondary | ICD-10-CM | POA: Diagnosis not present

## 2020-09-16 DIAGNOSIS — I129 Hypertensive chronic kidney disease with stage 1 through stage 4 chronic kidney disease, or unspecified chronic kidney disease: Secondary | ICD-10-CM | POA: Diagnosis not present

## 2020-09-16 DIAGNOSIS — R7309 Other abnormal glucose: Secondary | ICD-10-CM | POA: Diagnosis not present

## 2020-09-16 DIAGNOSIS — E785 Hyperlipidemia, unspecified: Secondary | ICD-10-CM | POA: Diagnosis not present

## 2020-09-16 DIAGNOSIS — N183 Chronic kidney disease, stage 3 unspecified: Secondary | ICD-10-CM | POA: Diagnosis not present

## 2020-09-22 DIAGNOSIS — Z85528 Personal history of other malignant neoplasm of kidney: Secondary | ICD-10-CM | POA: Diagnosis not present

## 2020-09-28 ENCOUNTER — Ambulatory Visit
Admission: RE | Admit: 2020-09-28 | Discharge: 2020-09-28 | Disposition: A | Discharge status: Home / Self Care | Payer: Medicare Other | Source: Ambulatory Visit | Attending: Radiation Oncology | Admitting: Radiation Oncology

## 2020-09-28 DIAGNOSIS — D0511 Intraductal carcinoma in situ of right breast: Secondary | ICD-10-CM | POA: Insufficient documentation

## 2020-09-28 NOTE — Progress Notes (Signed)
  Radiation Oncology         [phone] ________________________________  Name: Mackenzie King MRN: [id number]  Date of Service: 09/28/2020  DOB: 06-08-1940  Post Treatment Telephone Note  Diagnosis:   ER/PR positive Intermediate Grade DCIS of the right breast.  Interval Since Last Radiation:  14 weeks   05/28/2020 through 06/25/2020 Site Technique Total Dose (Gy) Dose per Fx (Gy) Completed Fx Beam Energies  Breast, Right: Breast_Rt 3D 42.56/42.56 2.66 16/16 6X, 10X  Breast, Right: Breast_Rt_Bst specialPort 10/10 2.5 4/4 12E, 15E     Narrative:  The patient was contacted today for routine follow-up. During treatment she did very well with radiotherapy and did not have significant desquamation. She reports she feels that her skin has healed nicely and her fatigue has improved as well.   Impression/Plan: 1. ER/PR positive Intermediate Grade DCIS of the right breast. The patient has been doing well since completion of radiotherapy. We discussed that we would be happy to continue to follow her as needed, but she will also continue to follow up with Dr. Lindi Adie in medical oncology. She was counseled on skin care as well as measures to avoid sun exposure to this area.  2. Survivorship. We discussed the importance of survivorship evaluation and encouraged her to attend her upcoming visit with that clinic.      Carola Rhine, PAC

## 2020-10-07 DIAGNOSIS — D0511 Intraductal carcinoma in situ of right breast: Secondary | ICD-10-CM | POA: Diagnosis not present

## 2020-10-07 DIAGNOSIS — I129 Hypertensive chronic kidney disease with stage 1 through stage 4 chronic kidney disease, or unspecified chronic kidney disease: Secondary | ICD-10-CM | POA: Diagnosis not present

## 2020-10-07 DIAGNOSIS — E785 Hyperlipidemia, unspecified: Secondary | ICD-10-CM | POA: Diagnosis not present

## 2020-10-07 DIAGNOSIS — N183 Chronic kidney disease, stage 3 unspecified: Secondary | ICD-10-CM | POA: Diagnosis not present

## 2020-10-07 DIAGNOSIS — F324 Major depressive disorder, single episode, in partial remission: Secondary | ICD-10-CM | POA: Diagnosis not present

## 2020-10-07 DIAGNOSIS — J4520 Mild intermittent asthma, uncomplicated: Secondary | ICD-10-CM | POA: Diagnosis not present

## 2020-10-07 DIAGNOSIS — K219 Gastro-esophageal reflux disease without esophagitis: Secondary | ICD-10-CM | POA: Diagnosis not present

## 2020-10-16 ENCOUNTER — Telehealth: Payer: Self-pay | Admitting: Hematology and Oncology

## 2020-10-16 ENCOUNTER — Encounter: Payer: Self-pay | Admitting: Adult Health

## 2020-10-16 ENCOUNTER — Inpatient Hospital Stay: Payer: Medicare Other

## 2020-10-16 ENCOUNTER — Inpatient Hospital Stay: Payer: Medicare Other | Attending: Adult Health | Admitting: Adult Health

## 2020-10-16 ENCOUNTER — Other Ambulatory Visit: Payer: Self-pay

## 2020-10-16 ENCOUNTER — Telehealth: Payer: Self-pay

## 2020-10-16 VITALS — BP 122/52 | HR 66 | Temp 98.3°F | Resp 18 | Ht 61.0 in | Wt 166.0 lb

## 2020-10-16 DIAGNOSIS — Z79899 Other long term (current) drug therapy: Secondary | ICD-10-CM | POA: Insufficient documentation

## 2020-10-16 DIAGNOSIS — Z801 Family history of malignant neoplasm of trachea, bronchus and lung: Secondary | ICD-10-CM | POA: Diagnosis not present

## 2020-10-16 DIAGNOSIS — Z8249 Family history of ischemic heart disease and other diseases of the circulatory system: Secondary | ICD-10-CM | POA: Diagnosis not present

## 2020-10-16 DIAGNOSIS — G4733 Obstructive sleep apnea (adult) (pediatric): Secondary | ICD-10-CM | POA: Diagnosis not present

## 2020-10-16 DIAGNOSIS — Z9049 Acquired absence of other specified parts of digestive tract: Secondary | ICD-10-CM | POA: Diagnosis not present

## 2020-10-16 DIAGNOSIS — Z923 Personal history of irradiation: Secondary | ICD-10-CM | POA: Insufficient documentation

## 2020-10-16 DIAGNOSIS — Z86018 Personal history of other benign neoplasm: Secondary | ICD-10-CM | POA: Insufficient documentation

## 2020-10-16 DIAGNOSIS — Z8371 Family history of colonic polyps: Secondary | ICD-10-CM | POA: Diagnosis not present

## 2020-10-16 DIAGNOSIS — Z87440 Personal history of urinary (tract) infections: Secondary | ICD-10-CM | POA: Diagnosis not present

## 2020-10-16 DIAGNOSIS — Z8719 Personal history of other diseases of the digestive system: Secondary | ICD-10-CM | POA: Diagnosis not present

## 2020-10-16 DIAGNOSIS — Z823 Family history of stroke: Secondary | ICD-10-CM | POA: Insufficient documentation

## 2020-10-16 DIAGNOSIS — Z881 Allergy status to other antibiotic agents status: Secondary | ICD-10-CM | POA: Insufficient documentation

## 2020-10-16 DIAGNOSIS — D0511 Intraductal carcinoma in situ of right breast: Secondary | ICD-10-CM | POA: Diagnosis not present

## 2020-10-16 DIAGNOSIS — Z905 Acquired absence of kidney: Secondary | ICD-10-CM | POA: Diagnosis not present

## 2020-10-16 DIAGNOSIS — Z170 Estrogen receptor positive status [ER+]: Secondary | ICD-10-CM | POA: Diagnosis not present

## 2020-10-16 DIAGNOSIS — N390 Urinary tract infection, site not specified: Secondary | ICD-10-CM

## 2020-10-16 DIAGNOSIS — Z882 Allergy status to sulfonamides status: Secondary | ICD-10-CM | POA: Insufficient documentation

## 2020-10-16 DIAGNOSIS — R5383 Other fatigue: Secondary | ICD-10-CM | POA: Insufficient documentation

## 2020-10-16 DIAGNOSIS — Z803 Family history of malignant neoplasm of breast: Secondary | ICD-10-CM | POA: Diagnosis not present

## 2020-10-16 DIAGNOSIS — R350 Frequency of micturition: Secondary | ICD-10-CM | POA: Diagnosis not present

## 2020-10-16 LAB — URINALYSIS, COMPLETE (UACMP) WITH MICROSCOPIC
Bilirubin Urine: NEGATIVE
Glucose, UA: NEGATIVE mg/dL
Hgb urine dipstick: NEGATIVE
Ketones, ur: NEGATIVE mg/dL
Nitrite: POSITIVE — AB
Protein, ur: NEGATIVE mg/dL
Specific Gravity, Urine: 1.009 (ref 1.005–1.030)
WBC, UA: >50 WBC/hpf — ABNORMAL HIGH (ref 0–5)
pH: 6 (ref 5.0–8.0)

## 2020-10-16 NOTE — Telephone Encounter (Signed)
Faxed order for mammogram to [facility]. Received confirmation.

## 2020-10-16 NOTE — Progress Notes (Signed)
SURVIVORSHIP VISIT:  BRIEF ONCOLOGIC HISTORY:  Oncology History  Ductal carcinoma in situ (DCIS) of right breast  12/12/2019 Initial Diagnosis   Screening mammogram showed right breast calcifications spanning 5.0cm. Biopsy showed atypical ductal hyperplasia.  ER 70%, PR 10%, Ki-67 5%   03/26/2020 Surgery   Right lumpectomy ([facility]) ([id number]): DCIS, 2.0cm, involving a complex sclerosing lesion, clear margins. No regional lymph nodes were examined.   05/11/2020 Cancer Staging   Staging form: Breast, AJCC 8th Edition - Clinical: Stage 0 (cTis (DCIS), cN0, cM0, G2, ER+, PR+, HER2: Not Assessed)   05/28/2020 - 06/25/2020 Radiation Therapy   The patient initially received a dose of 42.56 Gy in 16 fractions to the breast using whole-breast tangent fields. This was delivered using a 3-D conformal technique. The pt received a boost delivering an additional 10 Gy in 4 fractions using the specialPort technique. The total dose was 52.56 Gy.   06/2020 - 06/2025 Anti-estrogen oral therapy   Tamoxifen     INTERVAL HISTORY:  Mackenzie King to review her survivorship care plan detailing her treatment course for breast cancer, as well as monitoring long-term side effects of that treatment, education regarding health maintenance, screening, and overall wellness and health promotion.     Overall, Mackenzie King reports feeling moderately well.  She has had some increased fatigue, most recently due to experiencing about 4 urinary tract infections over the past month.  She notes that she has an upcoming appt in about a week with urology.  She has a h/o of frequent UTIs, however hasn't noted this many in such a short time period.    REVIEW OF SYSTEMS:  Review of Systems  Constitutional: Positive for fatigue. Negative for appetite change, chills, fever and unexpected weight change.  HENT:   Negative for hearing loss, lump/mass and trouble swallowing.   Eyes: Negative for eye problems and icterus.  Respiratory:  Negative for chest tightness, cough and shortness of breath.   Cardiovascular: Negative for chest pain, leg swelling and palpitations.  Gastrointestinal: Negative for abdominal distention, abdominal pain, constipation, diarrhea, nausea and vomiting.  Endocrine: Negative for hot flashes.  Genitourinary: Positive for frequency. Negative for difficulty urinating.   Musculoskeletal: Negative for arthralgias.  Skin: Negative for itching and rash.  Neurological: Negative for dizziness, extremity weakness, headaches and numbness.  Hematological: Negative for adenopathy. Does not bruise/bleed easily.  Psychiatric/Behavioral: Negative for depression. The patient is not nervous/anxious.   Breast: Denies any new nodularity, masses, tenderness, nipple changes, or nipple discharge.      ONCOLOGY TREATMENT TEAM:  1. Surgeon:  Dr. Brantley Stage at [facility] 2. Medical Oncologist: Dr. Lindi Adie  3. Radiation Oncologist: Dr. Lisbeth Renshaw    PAST MEDICAL/SURGICAL HISTORY:  Past Medical History:  Diagnosis Date  . Abdominal distention   . Asthma   . Asymptomatic gallstones   . Atypical ductal hyperplasia of right breast   . 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 CKD (chronic kidney disease), stage III (H[facility])    Dr Florene Glen  . Colon polyps    hyperplastic  . Dermatitis    intermittent Dr Shelia Media  . Dyslipidemia   . Facet arthropathy, cervical   . GERD (gastroesophageal reflux disease)   . Hearing loss   . Hypertension   . Nasal congestion   . Obstructive sleep apnea    uses CPAP nightly  . Osteoarthritis    low back  . Osteopenia    mild  . Plantar fasciitis    s/p  shock tx Dr Paulla Dolly  . Pure hypercholesterolemia   . Restless legs syndrome (RLS)   . Wheezing    Past Surgical History:  Procedure Laterality Date  . BREAST LUMPECTOMY WITH RADIOACTIVE SEED LOCALIZATION Right 03/26/2020   Procedure: RIGHT BREAST LUMPECTOMY WITH RADIOACTIVE SEED LOCALIZATION;  Surgeon: Erroll Luna,  MD;  Location: [facility];  Service: General;  Laterality: Right;  . BREAST SURGERY  1996   breast biopsy - benign calcifications  . BREAST SURGERY  1977   adenofibroma removed - 3 biopsy's  . CHOLECYSTECTOMY  03/19/2012   Procedure: LAPAROSCOPIC CHOLECYSTECTOMY WITH INTRAOPERATIVE CHOLANGIOGRAM;  Surgeon: Adin Hector, MD;  Location: [facility];  Service: General;  Laterality: N/A;  . CHOLECYSTECTOMY  03/19/2012   Procedure: CHOLECYSTECTOMY;  Surgeon: Adin Hector, MD;  Location: [facility];  Service: General;  Laterality: N/A;  converted to open @ 1137  . EYE SURGERY  02/2012   bilateral cataracts with lens implants  . FOOT NEUROMA SURGERY     right foot  . LIPOMA EXCISION    . 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 TUBAL LIGATION  1971     ALLERGIES:  Allergies  Allergen Reactions  . Crestor [Rosuvastatin] Other (See Comments)    Muscle aches, constipation.  . Sulfa Antibiotics Itching and Swelling    Face only, breathing not affected.  . Ciprofloxacin Other (See Comments)    Pt states that it made her have tremors.  . Requip [Ropinirole]     Other reaction(s): made restless leg worse     CURRENT MEDICATIONS:  Outpatient Encounter Medications as of 10/16/2020  Medication Sig  . acetaminophen (TYLENOL) 650 MG CR tablet Take 650 mg by mouth daily.   Marland Kitchen albuterol (PROVENTIL HFA;VENTOLIN HFA) 108 (90 BASE) MCG/ACT inhaler Inhale 2 puffs into the lungs every 6 (six) hours as needed. For shortness of breath  . amLODipine (NORVASC) 2.5 MG tablet Take 2.5 mg by mouth daily.  Marland Kitchen b complex vitamins tablet Take 1 tablet by mouth daily.  . cetirizine (ZYRTEC) 10 MG tablet Take 10 mg by mouth at bedtime.   . Cholecalciferol (VITAMIN D) 2000 UNITS tablet Take 2,000 Units by mouth at bedtime.   . Coenzyme Q10 (COQ10) 100 MG CAPS Take 1 capsule by mouth daily.  . Cranberry-Vitamin C-Vitamin E (CRANBERRY PLUS VITAMIN C) 4200-20-3  MG-MG-UNIT CAPS Take 2 capsules by mouth daily.  . famotidine (PEPCID) 20 MG tablet Take 40 mg by mouth as needed for heartburn or indigestion.  . fluticasone (FLONASE) 50 MCG/ACT nasal spray Place 2 sprays into the nose daily as needed. For allergies  . furosemide (LASIX) 40 MG tablet Take 40 mg by mouth as needed for fluid or edema.  . hydrochlorothiazide (HYDRODIURIL) 12.5 MG tablet Take 12.5 mg by mouth every morning.  . Melatonin 12 MG TABS Take 1 tablet by mouth as needed.  . Multiple Vitamins-Minerals (CENTRUM SILVER 50+WOMEN PO) Take 1 tablet by mouth daily.  . pravastatin (PRAVACHOL) 40 MG tablet Take 40 mg by mouth daily.   . solifenacin (VESICARE) 5 MG tablet Take 10 mg by mouth daily.   . SYMBICORT 160-4.5 MCG/ACT inhaler Place 2 puffs into both nostrils 2 (two) times daily.  . tamoxifen (NOLVADEX) 20 MG tablet Take 1 tablet (20 mg total) by mouth daily.  Marland Kitchen tiZANidine (ZANAFLEX) 2 MG tablet Take 2 mg by mouth at bedtime as needed. Restless legs (Patient not taking:  Reported on 05/07/2020)  . valsartan (DIOVAN) 320 MG tablet Take 320 mg by mouth daily.   No facility-administered encounter medications on file as of 10/16/2020.     ONCOLOGIC FAMILY HISTORY:  Family History  Problem Relation Age of Onset  . Transient ischemic attack Mother   . Cancer Mother        breast  . Hypertension Mother   . Alzheimer's disease Mother   . Stroke Father   . Colon polyps Father   . Cancer Sister   . Cancer Paternal Grandfather        lung     GENETIC COUNSELING/TESTING: Not at this time  SOCIAL HISTORY:  Social History   Socioeconomic History  . Marital status: Widowed    Spouse name: Not on file  . Number of children: Not on file  . Years of education: Not on file  . Highest education level: Not on file  Occupational History  . Not on file  Tobacco Use  . Smoking status: Never Smoker  . Smokeless tobacco: Never Used  Vaping Use  . Vaping Use: Never used  Substance and  Sexual Activity  . Alcohol use: Not Currently  . Drug use: No  . Sexual activity: Not Currently    Birth control/protection: Post-menopausal  Other Topics Concern  . Not on file  Social History Narrative  . Not on file   Social Determinants of Health   Financial Resource Strain: Not on file  Food Insecurity: Not on file  Transportation Needs: Not on file  Physical Activity: Not on file  Stress: Not on file  Social Connections: Not on file  Intimate Partner Violence: Not on file     OBSERVATIONS/OBJECTIVE:  BP (!) 122/52 (BP Location: Left Arm, Patient Position: Sitting)   Pulse 66   Temp 98.3 F (36.8 C) (Tympanic)   Resp 18   Ht 5' 1"  (1.549 m)   Wt 166 lb (75.3 kg)   SpO2 99%   BMI 31.37 kg/m  GENERAL: Patient is a well appearing female in no acute distress HEENT:  Sclerae anicteric.  Oropharynx clear and moist. No ulcerations or evidence of oropharyngeal candidiasis. Neck is supple.  NODES:  No cervical, supraclavicular, or axillary lymphadenopathy palpated.  BREAST EXAM:  Right breast s/p lumpectomy and radiation, no sign of local recurrence, left breast benign LUNGS:  Clear to auscultation bilaterally.  No wheezes or rhonchi. HEART:  Regular rate and rhythm. No murmur appreciated. ABDOMEN:  Soft, nontender.  Positive, normoactive bowel sounds. No organomegaly palpated. MSK:  No focal spinal tenderness to palpation. Full range of motion bilaterally in the upper extremities. EXTREMITIES:  No peripheral edema.   SKIN:  Clear with no obvious rashes or skin changes. No nail dyscrasia. NEURO:  Nonfocal. Well oriented.  Appropriate affect.    LABORATORY DATA:  None for this visit.  DIAGNOSTIC IMAGING:  None for this visit.      ASSESSMENT AND PLAN:  Ms.. King is a pleasant 80 y.o. female with Stage 0 right breast DCIS, ER+/PR+, diagnosed in 11/2019, treated with lumpectomy, adjuvant radiation therapy, and anti-estrogen therapy with Tamoxifen beginning in  06/2020.  She presents to the Survivorship Clinic for our initial meeting and routine follow-up post-completion of treatment for breast cancer.    1. Stage 0 right breast cancer:  Mackenzie King is continuing to recover from definitive treatment for breast cancer. She will follow-up with her medical oncologist, Dr. Lindi Adie in 6 months with history and physical exam per surveillance  protocol.  Her mammogram is due 11/2020; orders placed today.  Today, a comprehensive survivorship care plan and treatment summary was reviewed with the patient today detailing her breast cancer diagnosis, treatment course, potential late/long-term effects of treatment, appropriate follow-up care with recommendations for the future, and patient education resources.  A copy of this summary, along with a letter will be sent to the patient's primary care provider via mail/fax/In Basket message after today's visit.    2. Recurrent UTIs.  We will get a repeat urinalysis and culture.  She says she can't tell if she is getting another infection.  Once we get the results of the culture we will send in antibiotics.  She will talk with Dr. Junious King and we will discuss the need to stop tamoxifen after she meets with him, since these UTIs all started after she started the antiestrogen therapy.  3. Bone health:    She was given education on specific activities to promote bone health.  Tamoxifen often has a protective effect on the bones.  4. Cancer screening:  Due to Mackenzie King's history and her age, she should receive screening for skin cancers.  The information and recommendations are listed on the patient's comprehensive care plan/treatment summary and were reviewed in detail with the patient.    5. Health maintenance and wellness promotion: Mackenzie King was encouraged to consume 5-7 servings of fruits and vegetables per day. We reviewed the "Nutrition Rainbow" handout, as well as the handout "Take Control of Your Health and Reduce Your Cancer  Risk" from the [place].  She was also encouraged to engage in moderate to vigorous exercise for 30 minutes per day most days of the week. We discussed the [employer] fitness program, which is designed for cancer survivors to help them become more physically fit after cancer treatments.  She was instructed to limit her alcohol consumption and continue to abstain from tobacco use.     6. Support services/counseling: It is not uncommon for this period of the patient's cancer care trajectory to be one of many emotions and stressors.  We discussed how this can be increasingly difficult during the times of quarantine and social distancing due to the COVID-19 pandemic.   She was given information regarding our available services and encouraged to contact me with any questions or for help enrolling in any of our support group/programs.    Follow up instructions:    -Return to cancer center in 6 months for f/u with Dr. Lindi Adie  -Mammogram due in 11/2020 -Follow up with surgery in one year -She is welcome to return back to the Survivorship Clinic at any time; no additional follow-up needed at this time.  -Consider referral back to survivorship as a long-term survivor for continued surveillance  The patient was provided an opportunity to ask questions and all were answered. The patient agreed with the plan and demonstrated an understanding of the instructions.   Total encounter time: 50 minutes of face to face visit time, chart review, order entry, collaboration with the care team,  And documentation of the encounter.  Wilber Bihari, NP 10/16/20 1:28 PM Medical Oncology and Hematology [facility] [place], [place] [address] Tel. [phone]    Fax. [phone]  *Total Encounter Time as defined by the Centers for Medicare and Medicaid Services includes, in addition to the face-to-face time of a patient visit (documented in the note above) non-face-to-face  time: obtaining and reviewing outside history, ordering and reviewing medications,  tests or procedures, care coordination (communications with other health care professionals or caregivers) and documentation in the medical record.

## 2020-10-16 NOTE — Telephone Encounter (Signed)
Scheduled appointment per 05/27 los. Patient is aware.

## 2020-10-19 ENCOUNTER — Other Ambulatory Visit: Payer: Self-pay | Admitting: Adult Health

## 2020-10-19 LAB — URINE CULTURE: Culture: >=100000 — AB

## 2020-10-19 MED ORDER — NITROFURANTOIN MONOHYD MACRO 100 MG PO CAPS: 100.0000 mg | ORAL_CAPSULE | Freq: Two times a day (BID) | ORAL | 0 refills | Status: DC

## 2020-10-19 NOTE — Progress Notes (Signed)
Urine culture + for E Coli.  Sending in script for [facility].  Reviewed with Neita in detail.  She asked about the tamoxifen and whether or not she should continue to take it.  I recommended that she stop taking it for four weeks and see if her frequent urinary tract infections decrease.  She noted she was afraid.  I reassured Ketura that since the urinary tract infections really didn't start again until after she started taking, it would be a good idea to rule out whether or not the tamoxifen is contributing to them.    We discussed that every medication has side effects, and for each individual we have to determine whether or not the side effects are worth the benefits of treatment.  In her case, having 4 urinary tract infections in 4 months is concerning because as you get older urinary tract infections can affect older patients differently.  She understands this.  For now, she will hold the Tamoxifen for 4 weeks and f/u with Dr. Junious Silk in 10 days when scheduled.    Wilber Bihari, NP

## 2020-10-20 ENCOUNTER — Encounter: Payer: Self-pay | Admitting: Licensed Clinical Social Worker

## 2020-10-20 NOTE — Progress Notes (Signed)
[facility] Quality of Life Screening Clinical Social Work  Clinical Social Work was referred by survivorship quality of life screening protocol.  The patient scored a 51.9 on the Quality of Life/ Cancer Survivor (QOL-CS) scale which indicates high quality of life.   Based on screener, there are no major concerns. Patient was given general survivorship resources and is welcome to attend [facility] or support group and utilize other resources as needed.    Follow up plan: 1. Follow-up QOL screen to be sent in 1 months.     Irish Piech E Kayci [place], LCSW

## 2020-10-29 DIAGNOSIS — R300 Dysuria: Secondary | ICD-10-CM | POA: Diagnosis not present

## 2020-10-29 DIAGNOSIS — R350 Frequency of micturition: Secondary | ICD-10-CM | POA: Diagnosis not present

## 2020-10-29 DIAGNOSIS — Z85528 Personal history of other malignant neoplasm of kidney: Secondary | ICD-10-CM | POA: Diagnosis not present

## 2020-11-16 DIAGNOSIS — K219 Gastro-esophageal reflux disease without esophagitis: Secondary | ICD-10-CM | POA: Diagnosis not present

## 2020-11-16 DIAGNOSIS — E785 Hyperlipidemia, unspecified: Secondary | ICD-10-CM | POA: Diagnosis not present

## 2020-11-16 DIAGNOSIS — F324 Major depressive disorder, single episode, in partial remission: Secondary | ICD-10-CM | POA: Diagnosis not present

## 2020-11-16 DIAGNOSIS — J4520 Mild intermittent asthma, uncomplicated: Secondary | ICD-10-CM | POA: Diagnosis not present

## 2020-11-16 DIAGNOSIS — N183 Chronic kidney disease, stage 3 unspecified: Secondary | ICD-10-CM | POA: Diagnosis not present

## 2020-11-16 DIAGNOSIS — I129 Hypertensive chronic kidney disease with stage 1 through stage 4 chronic kidney disease, or unspecified chronic kidney disease: Secondary | ICD-10-CM | POA: Diagnosis not present

## 2020-11-26 DIAGNOSIS — H04123 Dry eye syndrome of bilateral lacrimal glands: Secondary | ICD-10-CM | POA: Diagnosis not present

## 2020-11-26 DIAGNOSIS — H18593 Other hereditary corneal dystrophies, bilateral: Secondary | ICD-10-CM | POA: Diagnosis not present

## 2020-11-26 DIAGNOSIS — H35362 Drusen (degenerative) of macula, left eye: Secondary | ICD-10-CM | POA: Diagnosis not present

## 2020-11-26 DIAGNOSIS — H3562 Retinal hemorrhage, left eye: Secondary | ICD-10-CM | POA: Diagnosis not present

## 2020-12-03 DIAGNOSIS — K219 Gastro-esophageal reflux disease without esophagitis: Secondary | ICD-10-CM | POA: Diagnosis not present

## 2020-12-03 DIAGNOSIS — F324 Major depressive disorder, single episode, in partial remission: Secondary | ICD-10-CM | POA: Diagnosis not present

## 2020-12-03 DIAGNOSIS — D0511 Intraductal carcinoma in situ of right breast: Secondary | ICD-10-CM | POA: Diagnosis not present

## 2020-12-03 DIAGNOSIS — I129 Hypertensive chronic kidney disease with stage 1 through stage 4 chronic kidney disease, or unspecified chronic kidney disease: Secondary | ICD-10-CM | POA: Diagnosis not present

## 2020-12-03 DIAGNOSIS — E785 Hyperlipidemia, unspecified: Secondary | ICD-10-CM | POA: Diagnosis not present

## 2020-12-03 DIAGNOSIS — N183 Chronic kidney disease, stage 3 unspecified: Secondary | ICD-10-CM | POA: Diagnosis not present

## 2020-12-03 DIAGNOSIS — J4520 Mild intermittent asthma, uncomplicated: Secondary | ICD-10-CM | POA: Diagnosis not present

## 2020-12-15 DIAGNOSIS — Z853 Personal history of malignant neoplasm of breast: Secondary | ICD-10-CM | POA: Diagnosis not present

## 2020-12-15 DIAGNOSIS — R921 Mammographic calcification found on diagnostic imaging of breast: Secondary | ICD-10-CM | POA: Diagnosis not present

## 2020-12-17 ENCOUNTER — Encounter: Payer: Self-pay | Admitting: Adult Health

## 2020-12-25 DIAGNOSIS — N390 Urinary tract infection, site not specified: Secondary | ICD-10-CM | POA: Diagnosis not present

## 2020-12-25 DIAGNOSIS — R300 Dysuria: Secondary | ICD-10-CM | POA: Diagnosis not present

## 2021-01-19 DIAGNOSIS — N183 Chronic kidney disease, stage 3 unspecified: Secondary | ICD-10-CM | POA: Diagnosis not present

## 2021-01-19 DIAGNOSIS — E785 Hyperlipidemia, unspecified: Secondary | ICD-10-CM | POA: Diagnosis not present

## 2021-01-19 DIAGNOSIS — K219 Gastro-esophageal reflux disease without esophagitis: Secondary | ICD-10-CM | POA: Diagnosis not present

## 2021-01-19 DIAGNOSIS — I129 Hypertensive chronic kidney disease with stage 1 through stage 4 chronic kidney disease, or unspecified chronic kidney disease: Secondary | ICD-10-CM | POA: Diagnosis not present

## 2021-01-19 DIAGNOSIS — J4520 Mild intermittent asthma, uncomplicated: Secondary | ICD-10-CM | POA: Diagnosis not present

## 2021-01-19 DIAGNOSIS — F324 Major depressive disorder, single episode, in partial remission: Secondary | ICD-10-CM | POA: Diagnosis not present

## 2021-02-04 DIAGNOSIS — E785 Hyperlipidemia, unspecified: Secondary | ICD-10-CM | POA: Diagnosis not present

## 2021-02-04 DIAGNOSIS — F324 Major depressive disorder, single episode, in partial remission: Secondary | ICD-10-CM | POA: Diagnosis not present

## 2021-02-04 DIAGNOSIS — K219 Gastro-esophageal reflux disease without esophagitis: Secondary | ICD-10-CM | POA: Diagnosis not present

## 2021-02-04 DIAGNOSIS — N183 Chronic kidney disease, stage 3 unspecified: Secondary | ICD-10-CM | POA: Diagnosis not present

## 2021-02-04 DIAGNOSIS — I129 Hypertensive chronic kidney disease with stage 1 through stage 4 chronic kidney disease, or unspecified chronic kidney disease: Secondary | ICD-10-CM | POA: Diagnosis not present

## 2021-02-04 DIAGNOSIS — J4520 Mild intermittent asthma, uncomplicated: Secondary | ICD-10-CM | POA: Diagnosis not present

## 2021-03-04 DIAGNOSIS — F324 Major depressive disorder, single episode, in partial remission: Secondary | ICD-10-CM | POA: Diagnosis not present

## 2021-03-04 DIAGNOSIS — N183 Chronic kidney disease, stage 3 unspecified: Secondary | ICD-10-CM | POA: Diagnosis not present

## 2021-03-04 DIAGNOSIS — K219 Gastro-esophageal reflux disease without esophagitis: Secondary | ICD-10-CM | POA: Diagnosis not present

## 2021-03-04 DIAGNOSIS — E785 Hyperlipidemia, unspecified: Secondary | ICD-10-CM | POA: Diagnosis not present

## 2021-03-04 DIAGNOSIS — I129 Hypertensive chronic kidney disease with stage 1 through stage 4 chronic kidney disease, or unspecified chronic kidney disease: Secondary | ICD-10-CM | POA: Diagnosis not present

## 2021-03-04 DIAGNOSIS — J4520 Mild intermittent asthma, uncomplicated: Secondary | ICD-10-CM | POA: Diagnosis not present

## 2021-03-08 DIAGNOSIS — N390 Urinary tract infection, site not specified: Secondary | ICD-10-CM | POA: Diagnosis not present

## 2021-03-17 DIAGNOSIS — Z23 Encounter for immunization: Secondary | ICD-10-CM | POA: Diagnosis not present

## 2021-04-01 DIAGNOSIS — K219 Gastro-esophageal reflux disease without esophagitis: Secondary | ICD-10-CM | POA: Diagnosis not present

## 2021-04-01 DIAGNOSIS — E785 Hyperlipidemia, unspecified: Secondary | ICD-10-CM | POA: Diagnosis not present

## 2021-04-01 DIAGNOSIS — J4520 Mild intermittent asthma, uncomplicated: Secondary | ICD-10-CM | POA: Diagnosis not present

## 2021-04-01 DIAGNOSIS — N183 Chronic kidney disease, stage 3 unspecified: Secondary | ICD-10-CM | POA: Diagnosis not present

## 2021-04-01 DIAGNOSIS — I129 Hypertensive chronic kidney disease with stage 1 through stage 4 chronic kidney disease, or unspecified chronic kidney disease: Secondary | ICD-10-CM | POA: Diagnosis not present

## 2021-04-01 DIAGNOSIS — R7303 Prediabetes: Secondary | ICD-10-CM | POA: Diagnosis not present

## 2021-04-01 DIAGNOSIS — R609 Edema, unspecified: Secondary | ICD-10-CM | POA: Diagnosis not present

## 2021-04-01 DIAGNOSIS — Z85528 Personal history of other malignant neoplasm of kidney: Secondary | ICD-10-CM | POA: Diagnosis not present

## 2021-04-01 DIAGNOSIS — D0511 Intraductal carcinoma in situ of right breast: Secondary | ICD-10-CM | POA: Diagnosis not present

## 2021-04-01 DIAGNOSIS — Z0000 Encounter for general adult medical examination without abnormal findings: Secondary | ICD-10-CM | POA: Diagnosis not present

## 2021-04-01 DIAGNOSIS — G2581 Restless legs syndrome: Secondary | ICD-10-CM | POA: Diagnosis not present

## 2021-04-01 DIAGNOSIS — N390 Urinary tract infection, site not specified: Secondary | ICD-10-CM | POA: Diagnosis not present

## 2021-04-01 DIAGNOSIS — Z853 Personal history of malignant neoplasm of breast: Secondary | ICD-10-CM | POA: Diagnosis not present

## 2021-04-19 ENCOUNTER — Inpatient Hospital Stay: Payer: Medicare Other | Attending: Adult Health | Admitting: Adult Health

## 2021-04-19 ENCOUNTER — Other Ambulatory Visit: Payer: Self-pay

## 2021-04-19 VITALS — BP 151/65 | HR 75 | Temp 98.1°F | Resp 18 | Ht 61.0 in | Wt 159.4 lb

## 2021-04-19 DIAGNOSIS — Z905 Acquired absence of kidney: Secondary | ICD-10-CM | POA: Diagnosis not present

## 2021-04-19 DIAGNOSIS — Z87440 Personal history of urinary (tract) infections: Secondary | ICD-10-CM | POA: Diagnosis not present

## 2021-04-19 DIAGNOSIS — Z823 Family history of stroke: Secondary | ICD-10-CM | POA: Insufficient documentation

## 2021-04-19 DIAGNOSIS — Z803 Family history of malignant neoplasm of breast: Secondary | ICD-10-CM | POA: Insufficient documentation

## 2021-04-19 DIAGNOSIS — D0511 Intraductal carcinoma in situ of right breast: Secondary | ICD-10-CM | POA: Diagnosis not present

## 2021-04-19 DIAGNOSIS — Z8719 Personal history of other diseases of the digestive system: Secondary | ICD-10-CM | POA: Insufficient documentation

## 2021-04-19 DIAGNOSIS — Z9049 Acquired absence of other specified parts of digestive tract: Secondary | ICD-10-CM | POA: Diagnosis not present

## 2021-04-19 DIAGNOSIS — Z801 Family history of malignant neoplasm of trachea, bronchus and lung: Secondary | ICD-10-CM | POA: Insufficient documentation

## 2021-04-19 DIAGNOSIS — Z86018 Personal history of other benign neoplasm: Secondary | ICD-10-CM | POA: Diagnosis not present

## 2021-04-19 DIAGNOSIS — Z8249 Family history of ischemic heart disease and other diseases of the circulatory system: Secondary | ICD-10-CM | POA: Diagnosis not present

## 2021-04-19 DIAGNOSIS — Z882 Allergy status to sulfonamides status: Secondary | ICD-10-CM | POA: Diagnosis not present

## 2021-04-19 DIAGNOSIS — I10 Essential (primary) hypertension: Secondary | ICD-10-CM | POA: Diagnosis not present

## 2021-04-19 DIAGNOSIS — Z818 Family history of other mental and behavioral disorders: Secondary | ICD-10-CM | POA: Diagnosis not present

## 2021-04-19 DIAGNOSIS — Z79899 Other long term (current) drug therapy: Secondary | ICD-10-CM | POA: Diagnosis not present

## 2021-04-19 NOTE — Assessment & Plan Note (Addendum)
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  ER 30%, PR 10%, Ki-67 5%   Recommendation: 1. Adjuvant radiation therapy 05/29/2020-06/25/2020 2. Followed by antiestrogen therapy with tamoxifen 5 years--discontinued after 2 months due to 4 urinary tract infection in 4 weeks time.  Mackenzie King is here today for follow-up of her noninvasive breast cancer.  She will continue off tamoxifen.  I have reviewed the above with Mackenzie King who is in agreement of her staying off antiestrogen therapy Mackenzie King also wonders if she needs to follow-up since her primary care does her breast exam every year and she has a mammogram every year.  After discussion she will see [place] on an as-needed basis so long as she has a breast exam completed by her primary care provider along with a 3D mammogram.  She understands this.  I let her know that we are happy to see her and if she changes her mind and wishes to come back once a year we would certainly be happy to accommodate that.  She said that Mackenzie King reviewed something similar with her when he reviewed the guidelines for follow-up and noninvasive breast cancer.

## 2021-04-19 NOTE — Progress Notes (Signed)
[facility] Follow up:    Mackenzie Stains, MD [address] [place] [address]   DIAGNOSIS:  Cancer Staging  Ductal carcinoma in situ (DCIS) of right breast Staging form: Breast, AJCC 8th Edition - Clinical: Stage 0 (cTis (DCIS), cN0, cM0, G2, ER+, PR+, HER2: Not Assessed) - Signed by Nicholas Lose, MD on 05/11/2020   SUMMARY OF ONCOLOGIC HISTORY: Oncology History  Ductal carcinoma in situ (DCIS) of right breast  12/12/2019 Initial Diagnosis   Screening mammogram showed right breast calcifications spanning 5.0cm. Biopsy showed atypical ductal hyperplasia.  ER 70%, PR 10%, Ki-67 5%   03/26/2020 Surgery   Right lumpectomy ([facility]) ([id number]): DCIS, 2.0cm, involving a complex sclerosing lesion, clear margins. No regional lymph nodes were examined.   05/11/2020 Cancer Staging   Staging form: Breast, AJCC 8th Edition - Clinical: Stage 0 (cTis (DCIS), cN0, cM0, G2, ER+, PR+, HER2: Not Assessed)   05/28/2020 - 06/25/2020 Radiation Therapy   The patient initially received a dose of 42.56 Gy in 16 fractions to the breast using whole-breast tangent fields. This was delivered using a 3-D conformal technique. The pt received a boost delivering an additional 10 Gy in 4 fractions using the specialPort technique. The total dose was 52.56 Gy.   06/2020 - 08/2020 Anti-estrogen oral therapy   Tamoxifen discontinued due to for urinary tract infections in 4 weeks time.     CURRENT THERAPY: Observation  INTERVAL HISTORY: Mackenzie King 80 y.o. female returns for follow-up and evaluation of her noninvasive breast cancer.  At her last visit we discontinued her tamoxifen due to her having recurrent urinary tract infections 4 times in a 1 month's time.  We decided that the risks considering her age with repeat frequent UTIs that can often go unrecognized and lead to sepsis outweighed the benefits of preventing a new breast cancer moving forward.  She has opted to  forego seeing Dr. Brantley Stage unless needed.  She undergoes an annual breast exam with her primary care and notes that she did this 4 weeks ago.  Her most recent mammogram was completed at [facility] on December 15, 2020 and it showed no evidence of malignancy in breast density category B   Patient Active Problem List   Diagnosis Date Noted   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   Hypertension    Obstructive sleep apnea 05/09/2013   Obesity (BMI 30-39.9) 05/09/2013   Gallstones 02/15/2012    is allergic to crestor [rosuvastatin], sulfa antibiotics, ciprofloxacin, olmesartan, and requip [ropinirole].  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)   CKD (chronic kidney disease), stage III ([place])    Dr Florene Glen   Colon polyps    hyperplastic   Dermatitis    intermittent Dr Shelia Media   Dyslipidemia    Facet arthropathy, cervical    GERD (gastroesophageal reflux disease)    Hearing loss    Hypertension    Nasal congestion    Obstructive sleep apnea    uses CPAP nightly   Osteoarthritis    low back   Osteopenia    mild   Plantar fasciitis    s/p shock tx Dr Paulla Dolly   Pure hypercholesterolemia    Restless legs syndrome (RLS)    Wheezing  SURGICAL HISTORY: Past Surgical History:  Procedure Laterality Date   BREAST LUMPECTOMY WITH RADIOACTIVE SEED LOCALIZATION Right 03/26/2020   Procedure: RIGHT BREAST LUMPECTOMY WITH RADIOACTIVE SEED LOCALIZATION;  Surgeon: Erroll Luna, MD;  Location: [facility];  Service: General;  Laterality: Right;   BREAST SURGERY  1996   breast biopsy - benign calcifications   BREAST SURGERY  1977   adenofibroma removed - 3 biopsy's   CHOLECYSTECTOMY  03/19/2012   Procedure: LAPAROSCOPIC CHOLECYSTECTOMY WITH INTRAOPERATIVE CHOLANGIOGRAM;  Surgeon: Adin Hector,  MD;  Location: [facility];  Service: General;  Laterality: N/A;   CHOLECYSTECTOMY  03/19/2012   Procedure: CHOLECYSTECTOMY;  Surgeon: Adin Hector, MD;  Location: [facility];  Service: General;  Laterality: N/A;  converted to open @ 1137   EYE SURGERY  02/2012   bilateral cataracts with lens implants   FOOT NEUROMA SURGERY     right foot   LIPOMA EXCISION     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   [place]: Social History   Socioeconomic History   Marital status: Widowed    Spouse name: Not on file   Number of children: Not on file   Years of education: Not on file   Highest education level: Not on file  Occupational History   Not on file  Tobacco Use   Smoking status: Never   Smokeless tobacco: Never  Vaping Use   Vaping Use: Never used  Substance and Sexual Activity   Alcohol use: Not Currently   Drug use: No   Sexual activity: Not Currently    Birth control/protection: Post-menopausal  Other Topics Concern   Not on file  Social History Narrative   Not on file   Social Determinants of Health   Financial Resource Strain: Not on file  Food Insecurity: Not on file  Transportation Needs: Not on file  Physical Activity: Not on file  Stress: Not on file  Social Connections: Not on file  Intimate Partner Violence: Not on file    FAMILY HISTORY: Family History  Problem Relation Age of Onset   Transient ischemic attack Mother    Cancer Mother        breast   Hypertension Mother    Alzheimer's disease Mother    Stroke Father    Colon polyps Father    Cancer Sister    Cancer Paternal Grandfather        lung    Review of Systems  Constitutional:  Negative for appetite change, chills, fatigue, fever and unexpected weight change.  HENT:   Negative for hearing loss, lump/mass and trouble swallowing.   Eyes:  Negative for eye problems and icterus.  Respiratory:  Negative for chest tightness,  cough and shortness of breath.   Cardiovascular:  Negative for chest pain, leg swelling and palpitations.  Gastrointestinal:  Negative for abdominal distention, abdominal pain, constipation, diarrhea, nausea and vomiting.  Endocrine: Negative for hot flashes.  Genitourinary:  Negative for difficulty urinating.   Musculoskeletal:  Negative for arthralgias.  Skin:  Negative for itching and rash.  Neurological:  Negative for dizziness, extremity weakness, headaches and numbness.  Hematological:  Negative for adenopathy. Does not bruise/bleed easily.  Psychiatric/Behavioral:  Negative for depression. The patient is not nervous/anxious.      PHYSICAL EXAMINATION  ECOG PERFORMANCE STATUS: 0 - Asymptomatic  Vitals:   04/19/21 1358  BP: (!) 151/65  Pulse:  75  Resp: 18  Temp: 98.1 F (36.7 C)  SpO2: 98%    Physical Exam Constitutional:      General: She is not in acute distress.    Appearance: Normal appearance. She is not toxic-appearing.  HENT:     Head: Normocephalic and atraumatic.  Eyes:     General: No scleral icterus. Cardiovascular:     Rate and Rhythm: Normal rate and regular rhythm.     Pulses: Normal pulses.     Heart sounds: Normal heart sounds.  Pulmonary:     Effort: Pulmonary effort is normal.     Breath sounds: Normal breath sounds.  Chest:     Comments: Declined breast exam as she had this with her primary care a month ago Abdominal:     General: Abdomen is flat. Bowel sounds are normal. There is no distension.     Palpations: Abdomen is soft.     Tenderness: There is no abdominal tenderness.  Musculoskeletal:        General: No swelling.     Cervical back: Neck supple.  Lymphadenopathy:     Cervical: No cervical adenopathy.  Skin:    General: Skin is warm and dry.     Findings: No rash.  Neurological:     General: No focal deficit present.     Mental Status: She is alert.  Psychiatric:        Mood and Affect: Mood normal.        Behavior: Behavior  normal.    LABORATORY DATA:  CBC    Component Value Date/Time   WBC 8.3 03/23/2020 1100   RBC 3.90 03/23/2020 1100   HGB 12.0 03/23/2020 1100   HCT 36.7 03/23/2020 1100   PLT 342 03/23/2020 1100   MCV 94.1 03/23/2020 1100   MCH 30.8 03/23/2020 1100   MCHC 32.7 03/23/2020 1100   RDW 12.9 03/23/2020 1100   LYMPHSABS 2.7 03/23/2020 1100   MONOABS 1.0 03/23/2020 1100   EOSABS 0.3 03/23/2020 1100   BASOSABS 0.0 03/23/2020 1100    CMP     Component Value Date/Time   NA 137 03/23/2020 1100   K 4.6 03/23/2020 1100   CL 104 03/23/2020 1100   CO2 26 03/23/2020 1100   GLUCOSE 89 03/23/2020 1100   BUN 12 03/23/2020 1100   CREATININE 1.01 (H) 03/23/2020 1100   CALCIUM 9.2 03/23/2020 1100   PROT 5.7 (L) 03/23/2020 1100   ALBUMIN 3.6 03/23/2020 1100   AST 17 03/23/2020 1100   ALT 16 03/23/2020 1100   ALKPHOS 70 03/23/2020 1100   BILITOT 0.6 03/23/2020 1100   GFRNONAA 56 (L) 03/23/2020 1100   GFRAA 62 (L) 03/20/2012 0413        ASSESSMENT and THERAPY PLAN:   Ductal carcinoma in situ (DCIS) of right breast 04/05/2020:Right lumpectomy ([facility]): DCIS, 2.0cm, involving a complex sclerosing lesion, clear margins.  ER 30%, PR 10%, Ki-67 5%   Recommendation: 1. Adjuvant radiation therapy 05/29/2020-06/25/2020 2. Followed by antiestrogen therapy with tamoxifen 5 years--discontinued after 2 months due to 4 urinary tract infection in 4 weeks time.  Mackenzie King is here today for follow-up of her noninvasive breast cancer.  She will continue off tamoxifen.  I have reviewed the above with Dr. Payton Mccallum who is in agreement of her staying off antiestrogen therapy Mackenzie King also wonders if she needs to follow-up since her primary care does her breast exam every year and she has a mammogram every year.  After discussion she will see [place]  on an as-needed basis so long as she has a breast exam completed by her primary care provider along with a 3D mammogram.  She understands this.  I let her know that we are  happy to see her and if she changes her mind and wishes to come back once a year we would certainly be happy to accommodate that.  She said that Dr. Brantley Stage reviewed something similar with her when he reviewed the guidelines for follow-up and noninvasive breast cancer.   All questions were answered. The patient knows to call the clinic with any problems, questions or concerns. We can certainly see the patient much sooner if necessary.  Total encounter time: 30 minutes in face-to-face visit time, chart review, care coordination, and documentation of the encounter.  Wilber Bihari, NP 04/20/21 8:43 AM Medical Oncology and Hematology [facility] [place], [place] [address] Tel. [phone]    Fax. [phone]  *Total Encounter Time as defined by the Centers for Medicare and Medicaid Services includes, in addition to the face-to-face time of a patient visit (documented in the note above) non-face-to-face time: obtaining and reviewing outside history, ordering and reviewing medications, tests or procedures, care coordination (communications with other health care professionals or caregivers) and documentation in the medical record.

## 2021-04-21 ENCOUNTER — Ambulatory Visit (INDEPENDENT_AMBULATORY_CARE_PROVIDER_SITE_OTHER): Payer: Medicare Other | Admitting: Cardiology

## 2021-04-21 ENCOUNTER — Other Ambulatory Visit: Payer: Self-pay

## 2021-04-21 ENCOUNTER — Encounter: Payer: Self-pay | Admitting: Cardiology

## 2021-04-21 VITALS — BP 140/72 | HR 69 | Ht 61.0 in | Wt 161.0 lb

## 2021-04-21 DIAGNOSIS — G4733 Obstructive sleep apnea (adult) (pediatric): Secondary | ICD-10-CM

## 2021-04-21 DIAGNOSIS — I10 Essential (primary) hypertension: Secondary | ICD-10-CM

## 2021-04-21 NOTE — Progress Notes (Signed)
Date:  04/21/2021   ID:  Yuleni, Burich 17-Oct-1940, MRN [id number]  PCP:  [name], MD  Sleep Medicine:  Fransico Him, MD Electrophysiologist:  None   Chief Complaint:  OSA, HTN  History of Present Illness:    Mackenzie King is a 80 y.o. female  with a hx of mild OSA (AHI 6.35/hr with excessive daytime sleepiness and initial epworth sleepiness score of 15) on CPAP and  HTN.   She is doing well with his CPAP device and thinks that she has gotten used to it.  She tolerates the mask and feels the pressure is adequate.  Since going on CPAP she feels rested in the am and has no significant daytime sleepiness.  She does have some problems with mouth dryness.  She does not think that he snores.       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)   CKD (chronic kidney disease), stage III ([place])    Dr Florene Glen   Colon polyps    hyperplastic   Dermatitis    intermittent Dr Shelia Media   Dyslipidemia    Facet arthropathy, cervical    GERD (gastroesophageal reflux disease)    Hearing loss    Hypertension    Nasal congestion    Obstructive sleep apnea    uses CPAP nightly   Osteoarthritis    low back   Osteopenia    mild   Plantar fasciitis    s/p shock tx Dr Paulla Dolly   Pure hypercholesterolemia    Restless legs syndrome (RLS)    Wheezing    Past Surgical History:  Procedure Laterality Date   BREAST LUMPECTOMY WITH RADIOACTIVE SEED LOCALIZATION Right 03/26/2020   Procedure: RIGHT BREAST LUMPECTOMY WITH RADIOACTIVE SEED LOCALIZATION;  Surgeon: Erroll Luna, MD;  Location: [facility];  Service: General;  Laterality: Right;   BREAST SURGERY  1996   breast biopsy - benign calcifications   BREAST SURGERY  1977   adenofibroma  removed - 3 biopsy's   CHOLECYSTECTOMY  03/19/2012   Procedure: LAPAROSCOPIC CHOLECYSTECTOMY WITH INTRAOPERATIVE CHOLANGIOGRAM;  Surgeon: Adin Hector, MD;  [facility];  Service: General;  Laterality: N/A;   CHOLECYSTECTOMY  03/19/2012   Procedure: CHOLECYSTECTOMY;  Surgeon: Adin Hector, MD;  [facility];  Service: General;  Laterality: N/A;  converted to open @ 1137   EYE SURGERY  02/2012   bilateral cataracts with lens implants   FOOT NEUROMA SURGERY     right foot   LIPOMA EXCISION     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    albuterol (PROVENTIL HFA;VENTOLIN HFA) 108 (90 BASE) MCG/ACT inhaler Inhale 2 puffs into the lungs every 6 (six) hours as needed. For shortness of breath   amLODipine (NORVASC) 2.5 MG tablet Take 2.5 mg by mouth daily.   b complex vitamins tablet Take 1 tablet by mouth daily.   cetirizine (ZYRTEC) 10 MG tablet Take 10 mg by mouth at bedtime.  Cholecalciferol (VITAMIN D) 2000 UNITS tablet Take 2,000 Units by mouth at bedtime.    Coenzyme Q10 (COQ10) 100 MG CAPS Take 1 capsule by mouth daily.   Cranberry-Vitamin C-Vitamin E (CRANBERRY PLUS VITAMIN C) 4200-20-3 MG-MG-UNIT CAPS Take 2 capsules by mouth daily.   famotidine (PEPCID) 20 MG tablet Take 40 mg by mouth as needed for heartburn or indigestion.   fluticasone (FLONASE) 50 MCG/ACT nasal spray Place 2 sprays into the nose daily as needed. For allergies   furosemide (LASIX) 40 MG tablet Take 40 mg by mouth as needed for fluid or edema.   hydrochlorothiazide (HYDRODIURIL) 12.5 MG tablet Take 12.5 mg by mouth every morning.   Melatonin 12 MG TABS Take 1 tablet by mouth as needed.   Multiple Vitamins-Minerals (CENTRUM SILVER 50+WOMEN PO) Take 1 tablet by mouth daily.   pravastatin (PRAVACHOL) 40 MG tablet Take 40 mg by mouth  daily.    solifenacin (VESICARE) 5 MG tablet Take 10 mg by mouth daily.    SYMBICORT 160-4.5 MCG/ACT inhaler Place 2 puffs into both nostrils 2 (two) times daily.   valsartan (DIOVAN) 320 MG tablet Take 320 mg by mouth daily.     Allergies:   Crestor [rosuvastatin], Sulfa antibiotics, Ciprofloxacin, Olmesartan, Requip [ropinirole], and Tamoxifen   Social History   Tobacco Use   Smoking status: Never   Smokeless tobacco: Never  Vaping Use   Vaping Use: Never used  Substance Use Topics   Alcohol use: Not Currently   Drug use: No     Family Hx: The patient's family history includes Alzheimer's disease in her mother; Cancer in her mother, paternal grandfather, and sister; Colon polyps in her father; Hypertension in her mother; Stroke in her father; Transient ischemic attack in her mother.  ROS:   Please see the history of present illness.     All other systems reviewed and are negative.   Labs/Other Tests and Data Reviewed:    Recent Labs: No results found for requested labs within last 8760 hours.   Recent Lipid Panel No results found for: CHOL, TRIG, HDL, CHOLHDL, LDLCALC, LDLDIRECT  Wt Readings from Last 3 Encounters:  04/21/21 161 lb (73 kg)  04/19/21 159 lb 6.4 oz (72.3 kg)  10/16/20 166 lb (75.3 kg)     Objective:    Vital Signs:  BP 140/72 (BP Location: Right Arm, Patient Position: Sitting, Cuff Size: Normal)   Pulse 69   Ht 5\' 1"  (1.549 m)   Wt 161 lb (73 kg)   SpO2 97%   BMI 30.42 kg/m   GEN: Well nourished, well developed in no acute distress HEENT: Normal NECK: No JVD; No carotid bruits LYMPHATICS: No lymphadenopathy CARDIAC:RRR, no murmurs, rubs, gallops RESPIRATORY:  Clear to auscultation without rales, wheezing or rhonchi  ABDOMEN: Soft, non-tender, non-distended MUSCULOSKELETAL:  No edema; No deformity  SKIN: Warm and dry NEUROLOGIC:  Alert and oriented x 3 PSYCHIATRIC:  Normal affect   ASSESSMENT & PLAN:    1.  OSA - The patient is  tolerating PAP therapy well without any problems. The PAP download performed by his DME was personally reviewed and interpreted by me today and showed an AHI of 0.7/hr on 8 cm H2O with 80% compliance in using more than 4 hours nightly.  The patient has been using and benefiting from PAP use and will continue to benefit from therapy.  -I will place an order for new CPAP supplies and new mask   2.  HTN -Her BP is adequately  controlled on exam today -Continue prescription drug management with amlodipine 2.5 mg daily and valsartan 320 mg daily with as needed refills -I have personally reviewed and interpreted outside labs performed by patient's PCP which showed serum creatinine 0.94 and potassium 4.7 04/01/2021  3.  Obesity -I have encouraged her to get into a routine exercise program and cut back on carbs and portions.   Medication Adjustments/Labs and Tests Ordered: Current medicines are reviewed at length with the patient today.  Concerns regarding medicines are outlined above.  Tests Ordered: No orders of the defined types were placed in this encounter.  Medication Changes: No orders of the defined types were placed in this encounter.   Disposition:  Follow up in 1 year(s)  Signed, Fransico Him, MD  04/21/2021 2:29 PM    [place]

## 2021-04-21 NOTE — Patient Instructions (Signed)

## 2021-04-23 ENCOUNTER — Telehealth: Payer: Self-pay | Admitting: *Deleted

## 2021-04-23 DIAGNOSIS — G4733 Obstructive sleep apnea (adult) (pediatric): Secondary | ICD-10-CM

## 2021-04-23 NOTE — Telephone Encounter (Signed)
Order placed to [employer] via community message. 

## 2021-04-23 NOTE — Telephone Encounter (Signed)
-----   Message from Antonieta Iba, RN sent at 04/21/2021  2:39 PM EST ----- Please order patient new CPAP supplies with mask of choice.  Thanks!

## 2021-05-10 DIAGNOSIS — Z9989 Dependence on other enabling machines and devices: Secondary | ICD-10-CM | POA: Diagnosis not present

## 2021-05-10 DIAGNOSIS — R490 Dysphonia: Secondary | ICD-10-CM | POA: Diagnosis not present

## 2021-05-10 DIAGNOSIS — J384 Edema of larynx: Secondary | ICD-10-CM | POA: Diagnosis not present

## 2021-05-10 DIAGNOSIS — J3489 Other specified disorders of nose and nasal sinuses: Secondary | ICD-10-CM | POA: Diagnosis not present

## 2021-05-10 DIAGNOSIS — G4733 Obstructive sleep apnea (adult) (pediatric): Secondary | ICD-10-CM | POA: Diagnosis not present

## 2021-05-10 DIAGNOSIS — Z974 Presence of external hearing-aid: Secondary | ICD-10-CM | POA: Diagnosis not present

## 2021-05-10 DIAGNOSIS — E041 Nontoxic single thyroid nodule: Secondary | ICD-10-CM | POA: Diagnosis not present

## 2021-05-25 DIAGNOSIS — H1045 Other chronic allergic conjunctivitis: Secondary | ICD-10-CM | POA: Diagnosis not present

## 2021-05-25 DIAGNOSIS — L299 Pruritus, unspecified: Secondary | ICD-10-CM | POA: Diagnosis not present

## 2021-05-25 DIAGNOSIS — J4540 Moderate persistent asthma, uncomplicated: Secondary | ICD-10-CM | POA: Diagnosis not present

## 2021-05-25 DIAGNOSIS — J3089 Other allergic rhinitis: Secondary | ICD-10-CM | POA: Diagnosis not present

## 2021-05-31 DIAGNOSIS — J4520 Mild intermittent asthma, uncomplicated: Secondary | ICD-10-CM | POA: Diagnosis not present

## 2021-05-31 DIAGNOSIS — N183 Chronic kidney disease, stage 3 unspecified: Secondary | ICD-10-CM | POA: Diagnosis not present

## 2021-05-31 DIAGNOSIS — E785 Hyperlipidemia, unspecified: Secondary | ICD-10-CM | POA: Diagnosis not present

## 2021-05-31 DIAGNOSIS — K219 Gastro-esophageal reflux disease without esophagitis: Secondary | ICD-10-CM | POA: Diagnosis not present

## 2021-05-31 DIAGNOSIS — I129 Hypertensive chronic kidney disease with stage 1 through stage 4 chronic kidney disease, or unspecified chronic kidney disease: Secondary | ICD-10-CM | POA: Diagnosis not present

## 2021-06-10 DIAGNOSIS — M79671 Pain in right foot: Secondary | ICD-10-CM | POA: Diagnosis not present

## 2021-06-11 ENCOUNTER — Other Ambulatory Visit: Payer: Self-pay | Admitting: Nurse Practitioner

## 2021-06-11 ENCOUNTER — Ambulatory Visit
Admission: RE | Admit: 2021-06-11 | Discharge: 2021-06-11 | Disposition: A | Discharge status: Home / Self Care | Payer: Medicare Other | Source: Ambulatory Visit | Attending: Nurse Practitioner | Admitting: Nurse Practitioner

## 2021-06-11 DIAGNOSIS — M79674 Pain in right toe(s): Secondary | ICD-10-CM

## 2021-06-11 DIAGNOSIS — R2681 Unsteadiness on feet: Secondary | ICD-10-CM | POA: Diagnosis not present

## 2021-06-11 DIAGNOSIS — Y92009 Unspecified place in unspecified non-institutional (private) residence as the place of occurrence of the external cause: Secondary | ICD-10-CM

## 2021-06-11 DIAGNOSIS — N183 Chronic kidney disease, stage 3 unspecified: Secondary | ICD-10-CM | POA: Diagnosis not present

## 2021-06-11 DIAGNOSIS — R55 Syncope and collapse: Secondary | ICD-10-CM | POA: Diagnosis not present

## 2021-06-11 DIAGNOSIS — I129 Hypertensive chronic kidney disease with stage 1 through stage 4 chronic kidney disease, or unspecified chronic kidney disease: Secondary | ICD-10-CM | POA: Diagnosis not present

## 2021-06-11 DIAGNOSIS — M79671 Pain in right foot: Secondary | ICD-10-CM | POA: Diagnosis not present

## 2021-06-11 DIAGNOSIS — M25571 Pain in right ankle and joints of right foot: Secondary | ICD-10-CM | POA: Diagnosis not present

## 2021-06-11 DIAGNOSIS — M25562 Pain in left knee: Secondary | ICD-10-CM | POA: Diagnosis not present

## 2021-06-28 DIAGNOSIS — J4520 Mild intermittent asthma, uncomplicated: Secondary | ICD-10-CM | POA: Diagnosis not present

## 2021-06-28 DIAGNOSIS — E785 Hyperlipidemia, unspecified: Secondary | ICD-10-CM | POA: Diagnosis not present

## 2021-06-28 DIAGNOSIS — I10 Essential (primary) hypertension: Secondary | ICD-10-CM | POA: Diagnosis not present

## 2021-07-01 DIAGNOSIS — J385 Laryngeal spasm: Secondary | ICD-10-CM | POA: Diagnosis not present

## 2021-07-01 DIAGNOSIS — F17200 Nicotine dependence, unspecified, uncomplicated: Secondary | ICD-10-CM | POA: Diagnosis not present

## 2021-07-01 DIAGNOSIS — R490 Dysphonia: Secondary | ICD-10-CM | POA: Diagnosis not present

## 2021-07-01 DIAGNOSIS — J383 Other diseases of vocal cords: Secondary | ICD-10-CM | POA: Diagnosis not present

## 2021-07-06 ENCOUNTER — Encounter (HOSPITAL_COMMUNITY): Payer: Self-pay

## 2021-07-12 DIAGNOSIS — Z23 Encounter for immunization: Secondary | ICD-10-CM | POA: Diagnosis not present

## 2021-07-29 DIAGNOSIS — N183 Chronic kidney disease, stage 3 unspecified: Secondary | ICD-10-CM | POA: Diagnosis not present

## 2021-07-29 DIAGNOSIS — E785 Hyperlipidemia, unspecified: Secondary | ICD-10-CM | POA: Diagnosis not present

## 2021-07-29 DIAGNOSIS — I129 Hypertensive chronic kidney disease with stage 1 through stage 4 chronic kidney disease, or unspecified chronic kidney disease: Secondary | ICD-10-CM | POA: Diagnosis not present

## 2021-07-29 DIAGNOSIS — J4520 Mild intermittent asthma, uncomplicated: Secondary | ICD-10-CM | POA: Diagnosis not present

## 2021-08-17 DIAGNOSIS — R490 Dysphonia: Secondary | ICD-10-CM | POA: Diagnosis not present

## 2021-08-20 DIAGNOSIS — U071 COVID-19: Secondary | ICD-10-CM | POA: Diagnosis not present

## 2021-09-10 DIAGNOSIS — R490 Dysphonia: Secondary | ICD-10-CM | POA: Diagnosis not present

## 2021-09-16 DIAGNOSIS — R490 Dysphonia: Secondary | ICD-10-CM | POA: Diagnosis not present

## 2021-09-27 DIAGNOSIS — K219 Gastro-esophageal reflux disease without esophagitis: Secondary | ICD-10-CM | POA: Diagnosis not present

## 2021-09-27 DIAGNOSIS — N183 Chronic kidney disease, stage 3 unspecified: Secondary | ICD-10-CM | POA: Diagnosis not present

## 2021-09-27 DIAGNOSIS — E785 Hyperlipidemia, unspecified: Secondary | ICD-10-CM | POA: Diagnosis not present

## 2021-09-27 DIAGNOSIS — J4520 Mild intermittent asthma, uncomplicated: Secondary | ICD-10-CM | POA: Diagnosis not present

## 2021-09-29 DIAGNOSIS — N183 Chronic kidney disease, stage 3 unspecified: Secondary | ICD-10-CM | POA: Diagnosis not present

## 2021-09-29 DIAGNOSIS — E041 Nontoxic single thyroid nodule: Secondary | ICD-10-CM | POA: Diagnosis not present

## 2021-09-29 DIAGNOSIS — E785 Hyperlipidemia, unspecified: Secondary | ICD-10-CM | POA: Diagnosis not present

## 2021-09-29 DIAGNOSIS — I129 Hypertensive chronic kidney disease with stage 1 through stage 4 chronic kidney disease, or unspecified chronic kidney disease: Secondary | ICD-10-CM | POA: Diagnosis not present

## 2021-09-29 DIAGNOSIS — R7303 Prediabetes: Secondary | ICD-10-CM | POA: Diagnosis not present

## 2021-09-29 DIAGNOSIS — Z905 Acquired absence of kidney: Secondary | ICD-10-CM | POA: Diagnosis not present

## 2021-10-27 DIAGNOSIS — J4520 Mild intermittent asthma, uncomplicated: Secondary | ICD-10-CM | POA: Diagnosis not present

## 2021-10-27 DIAGNOSIS — N183 Chronic kidney disease, stage 3 unspecified: Secondary | ICD-10-CM | POA: Diagnosis not present

## 2021-10-27 DIAGNOSIS — E785 Hyperlipidemia, unspecified: Secondary | ICD-10-CM | POA: Diagnosis not present

## 2021-10-27 DIAGNOSIS — K219 Gastro-esophageal reflux disease without esophagitis: Secondary | ICD-10-CM | POA: Diagnosis not present

## 2021-11-24 DIAGNOSIS — K219 Gastro-esophageal reflux disease without esophagitis: Secondary | ICD-10-CM | POA: Diagnosis not present

## 2021-11-24 DIAGNOSIS — J4520 Mild intermittent asthma, uncomplicated: Secondary | ICD-10-CM | POA: Diagnosis not present

## 2021-11-24 DIAGNOSIS — E785 Hyperlipidemia, unspecified: Secondary | ICD-10-CM | POA: Diagnosis not present

## 2021-11-24 DIAGNOSIS — N183 Chronic kidney disease, stage 3 unspecified: Secondary | ICD-10-CM | POA: Diagnosis not present

## 2021-12-03 DIAGNOSIS — H524 Presbyopia: Secondary | ICD-10-CM | POA: Diagnosis not present

## 2021-12-03 DIAGNOSIS — H04123 Dry eye syndrome of bilateral lacrimal glands: Secondary | ICD-10-CM | POA: Diagnosis not present

## 2021-12-03 DIAGNOSIS — H18513 Endothelial corneal dystrophy, bilateral: Secondary | ICD-10-CM | POA: Diagnosis not present

## 2021-12-03 DIAGNOSIS — H4322 Crystalline deposits in vitreous body, left eye: Secondary | ICD-10-CM | POA: Diagnosis not present

## 2021-12-16 DIAGNOSIS — M85851 Other specified disorders of bone density and structure, right thigh: Secondary | ICD-10-CM | POA: Diagnosis not present

## 2021-12-16 DIAGNOSIS — Z853 Personal history of malignant neoplasm of breast: Secondary | ICD-10-CM | POA: Diagnosis not present

## 2021-12-16 DIAGNOSIS — N6489 Other specified disorders of breast: Secondary | ICD-10-CM | POA: Diagnosis not present

## 2021-12-16 DIAGNOSIS — R928 Other abnormal and inconclusive findings on diagnostic imaging of breast: Secondary | ICD-10-CM | POA: Diagnosis not present

## 2021-12-16 DIAGNOSIS — M85852 Other specified disorders of bone density and structure, left thigh: Secondary | ICD-10-CM | POA: Diagnosis not present

## 2021-12-23 DIAGNOSIS — K219 Gastro-esophageal reflux disease without esophagitis: Secondary | ICD-10-CM | POA: Diagnosis not present

## 2021-12-23 DIAGNOSIS — E785 Hyperlipidemia, unspecified: Secondary | ICD-10-CM | POA: Diagnosis not present

## 2021-12-23 DIAGNOSIS — J4520 Mild intermittent asthma, uncomplicated: Secondary | ICD-10-CM | POA: Diagnosis not present

## 2021-12-23 DIAGNOSIS — N183 Chronic kidney disease, stage 3 unspecified: Secondary | ICD-10-CM | POA: Diagnosis not present

## 2021-12-23 DIAGNOSIS — R350 Frequency of micturition: Secondary | ICD-10-CM | POA: Diagnosis not present

## 2022-01-13 DIAGNOSIS — R350 Frequency of micturition: Secondary | ICD-10-CM | POA: Diagnosis not present

## 2022-01-25 DIAGNOSIS — J4520 Mild intermittent asthma, uncomplicated: Secondary | ICD-10-CM | POA: Diagnosis not present

## 2022-01-25 DIAGNOSIS — N183 Chronic kidney disease, stage 3 unspecified: Secondary | ICD-10-CM | POA: Diagnosis not present

## 2022-01-25 DIAGNOSIS — E785 Hyperlipidemia, unspecified: Secondary | ICD-10-CM | POA: Diagnosis not present

## 2022-01-25 DIAGNOSIS — K219 Gastro-esophageal reflux disease without esophagitis: Secondary | ICD-10-CM | POA: Diagnosis not present

## 2022-02-24 DIAGNOSIS — K219 Gastro-esophageal reflux disease without esophagitis: Secondary | ICD-10-CM | POA: Diagnosis not present

## 2022-02-24 DIAGNOSIS — J4520 Mild intermittent asthma, uncomplicated: Secondary | ICD-10-CM | POA: Diagnosis not present

## 2022-02-24 DIAGNOSIS — E785 Hyperlipidemia, unspecified: Secondary | ICD-10-CM | POA: Diagnosis not present

## 2022-02-24 DIAGNOSIS — N183 Chronic kidney disease, stage 3 unspecified: Secondary | ICD-10-CM | POA: Diagnosis not present

## 2022-03-10 DIAGNOSIS — M6281 Muscle weakness (generalized): Secondary | ICD-10-CM | POA: Diagnosis not present

## 2022-03-10 DIAGNOSIS — M6289 Other specified disorders of muscle: Secondary | ICD-10-CM | POA: Diagnosis not present

## 2022-03-10 DIAGNOSIS — N3946 Mixed incontinence: Secondary | ICD-10-CM | POA: Diagnosis not present

## 2022-03-10 DIAGNOSIS — R159 Full incontinence of feces: Secondary | ICD-10-CM | POA: Diagnosis not present

## 2022-03-22 DIAGNOSIS — M62838 Other muscle spasm: Secondary | ICD-10-CM | POA: Diagnosis not present

## 2022-03-22 DIAGNOSIS — M6281 Muscle weakness (generalized): Secondary | ICD-10-CM | POA: Diagnosis not present

## 2022-03-22 DIAGNOSIS — N3946 Mixed incontinence: Secondary | ICD-10-CM | POA: Diagnosis not present

## 2022-03-22 DIAGNOSIS — K5900 Constipation, unspecified: Secondary | ICD-10-CM | POA: Diagnosis not present

## 2022-03-22 DIAGNOSIS — M6289 Other specified disorders of muscle: Secondary | ICD-10-CM | POA: Diagnosis not present

## 2022-03-25 DIAGNOSIS — Z23 Encounter for immunization: Secondary | ICD-10-CM | POA: Diagnosis not present

## 2022-03-29 DIAGNOSIS — K5900 Constipation, unspecified: Secondary | ICD-10-CM | POA: Diagnosis not present

## 2022-03-29 DIAGNOSIS — M62838 Other muscle spasm: Secondary | ICD-10-CM | POA: Diagnosis not present

## 2022-03-29 DIAGNOSIS — M6281 Muscle weakness (generalized): Secondary | ICD-10-CM | POA: Diagnosis not present

## 2022-03-29 DIAGNOSIS — M6289 Other specified disorders of muscle: Secondary | ICD-10-CM | POA: Diagnosis not present

## 2022-03-29 DIAGNOSIS — R351 Nocturia: Secondary | ICD-10-CM | POA: Diagnosis not present

## 2022-03-29 DIAGNOSIS — N3946 Mixed incontinence: Secondary | ICD-10-CM | POA: Diagnosis not present

## 2022-04-18 DIAGNOSIS — M8588 Other specified disorders of bone density and structure, other site: Secondary | ICD-10-CM | POA: Diagnosis not present

## 2022-04-18 DIAGNOSIS — J4520 Mild intermittent asthma, uncomplicated: Secondary | ICD-10-CM | POA: Diagnosis not present

## 2022-04-18 DIAGNOSIS — Z0000 Encounter for general adult medical examination without abnormal findings: Secondary | ICD-10-CM | POA: Diagnosis not present

## 2022-04-18 DIAGNOSIS — E785 Hyperlipidemia, unspecified: Secondary | ICD-10-CM | POA: Diagnosis not present

## 2022-04-18 DIAGNOSIS — I129 Hypertensive chronic kidney disease with stage 1 through stage 4 chronic kidney disease, or unspecified chronic kidney disease: Secondary | ICD-10-CM | POA: Diagnosis not present

## 2022-04-18 DIAGNOSIS — K219 Gastro-esophageal reflux disease without esophagitis: Secondary | ICD-10-CM | POA: Diagnosis not present

## 2022-04-18 DIAGNOSIS — N3281 Overactive bladder: Secondary | ICD-10-CM | POA: Diagnosis not present

## 2022-04-18 DIAGNOSIS — N183 Chronic kidney disease, stage 3 unspecified: Secondary | ICD-10-CM | POA: Diagnosis not present

## 2022-04-18 DIAGNOSIS — R7303 Prediabetes: Secondary | ICD-10-CM | POA: Diagnosis not present

## 2022-04-18 DIAGNOSIS — Z905 Acquired absence of kidney: Secondary | ICD-10-CM | POA: Diagnosis not present

## 2022-04-18 DIAGNOSIS — E041 Nontoxic single thyroid nodule: Secondary | ICD-10-CM | POA: Diagnosis not present

## 2022-04-18 DIAGNOSIS — R413 Other amnesia: Secondary | ICD-10-CM | POA: Diagnosis not present

## 2022-04-21 DIAGNOSIS — M62838 Other muscle spasm: Secondary | ICD-10-CM | POA: Diagnosis not present

## 2022-04-21 DIAGNOSIS — R351 Nocturia: Secondary | ICD-10-CM | POA: Diagnosis not present

## 2022-04-21 DIAGNOSIS — M6281 Muscle weakness (generalized): Secondary | ICD-10-CM | POA: Diagnosis not present

## 2022-04-21 DIAGNOSIS — M6289 Other specified disorders of muscle: Secondary | ICD-10-CM | POA: Diagnosis not present

## 2022-04-21 DIAGNOSIS — N3946 Mixed incontinence: Secondary | ICD-10-CM | POA: Diagnosis not present

## 2022-04-21 DIAGNOSIS — K5900 Constipation, unspecified: Secondary | ICD-10-CM | POA: Diagnosis not present

## 2022-04-25 DIAGNOSIS — E785 Hyperlipidemia, unspecified: Secondary | ICD-10-CM | POA: Diagnosis not present

## 2022-04-25 DIAGNOSIS — J4520 Mild intermittent asthma, uncomplicated: Secondary | ICD-10-CM | POA: Diagnosis not present

## 2022-04-25 DIAGNOSIS — N183 Chronic kidney disease, stage 3 unspecified: Secondary | ICD-10-CM | POA: Diagnosis not present

## 2022-04-25 DIAGNOSIS — K219 Gastro-esophageal reflux disease without esophagitis: Secondary | ICD-10-CM | POA: Diagnosis not present

## 2022-04-27 DIAGNOSIS — M6281 Muscle weakness (generalized): Secondary | ICD-10-CM | POA: Diagnosis not present

## 2022-04-27 DIAGNOSIS — M6289 Other specified disorders of muscle: Secondary | ICD-10-CM | POA: Diagnosis not present

## 2022-04-27 DIAGNOSIS — R351 Nocturia: Secondary | ICD-10-CM | POA: Diagnosis not present

## 2022-04-27 DIAGNOSIS — M62838 Other muscle spasm: Secondary | ICD-10-CM | POA: Diagnosis not present

## 2022-04-27 DIAGNOSIS — N3946 Mixed incontinence: Secondary | ICD-10-CM | POA: Diagnosis not present

## 2022-04-27 DIAGNOSIS — K5900 Constipation, unspecified: Secondary | ICD-10-CM | POA: Diagnosis not present

## 2022-05-11 DIAGNOSIS — M6289 Other specified disorders of muscle: Secondary | ICD-10-CM | POA: Diagnosis not present

## 2022-05-11 DIAGNOSIS — R159 Full incontinence of feces: Secondary | ICD-10-CM | POA: Diagnosis not present

## 2022-05-11 DIAGNOSIS — R351 Nocturia: Secondary | ICD-10-CM | POA: Diagnosis not present

## 2022-05-11 DIAGNOSIS — N3946 Mixed incontinence: Secondary | ICD-10-CM | POA: Diagnosis not present

## 2022-05-11 DIAGNOSIS — M6281 Muscle weakness (generalized): Secondary | ICD-10-CM | POA: Diagnosis not present

## 2022-05-24 DIAGNOSIS — H1045 Other chronic allergic conjunctivitis: Secondary | ICD-10-CM | POA: Diagnosis not present

## 2022-05-24 DIAGNOSIS — J3089 Other allergic rhinitis: Secondary | ICD-10-CM | POA: Diagnosis not present

## 2022-05-24 DIAGNOSIS — L299 Pruritus, unspecified: Secondary | ICD-10-CM | POA: Diagnosis not present

## 2022-05-24 DIAGNOSIS — N183 Chronic kidney disease, stage 3 unspecified: Secondary | ICD-10-CM | POA: Diagnosis not present

## 2022-05-24 DIAGNOSIS — J4520 Mild intermittent asthma, uncomplicated: Secondary | ICD-10-CM | POA: Diagnosis not present

## 2022-05-24 DIAGNOSIS — E785 Hyperlipidemia, unspecified: Secondary | ICD-10-CM | POA: Diagnosis not present

## 2022-05-24 DIAGNOSIS — J4540 Moderate persistent asthma, uncomplicated: Secondary | ICD-10-CM | POA: Diagnosis not present

## 2022-05-24 DIAGNOSIS — K219 Gastro-esophageal reflux disease without esophagitis: Secondary | ICD-10-CM | POA: Diagnosis not present

## 2022-06-15 DIAGNOSIS — Z85528 Personal history of other malignant neoplasm of kidney: Secondary | ICD-10-CM | POA: Diagnosis not present

## 2022-06-15 DIAGNOSIS — N3946 Mixed incontinence: Secondary | ICD-10-CM | POA: Diagnosis not present

## 2022-07-12 ENCOUNTER — Encounter: Payer: Self-pay | Admitting: Cardiology

## 2022-07-12 ENCOUNTER — Ambulatory Visit: Payer: Medicare Other | Attending: Cardiology | Admitting: Cardiology

## 2022-07-12 VITALS — BP 114/74 | HR 65 | Ht 62.0 in | Wt 165.4 lb

## 2022-07-12 DIAGNOSIS — I10 Essential (primary) hypertension: Secondary | ICD-10-CM

## 2022-07-12 DIAGNOSIS — G4733 Obstructive sleep apnea (adult) (pediatric): Secondary | ICD-10-CM | POA: Diagnosis not present

## 2022-07-12 NOTE — Patient Instructions (Signed)
Medication Instructions:  Your physician recommends that you continue on your current medications as directed. Please refer to the Current Medication list given to you today.  *If you need a refill on your cardiac medications before your next appointment, please call your pharmacy*   Lab Work: None.  If you have labs (blood work) drawn today and your tests are completely normal, you will receive your results only by: [place] (if you have MyChart) OR A paper copy in the mail If you have any lab test that is abnormal or we need to change your treatment, we will call you to review the results.   Testing/Procedures: None.   Follow-Up: At [facility], you and your health needs are our priority.  As part of our continuing mission to provide you with exceptional heart care, we have created designated Provider Care Teams.  These Care Teams include your primary Cardiologist (physician) and Advanced Practice Providers (APPs -  Physician Assistants and Nurse Practitioners) who all work together to provide you with the care you need, when you need it.  We recommend signing up for the patient portal called "MyChart".  Sign up information is provided on this After Visit Summary.  MyChart is used to connect with patients for Virtual Visits (Telemedicine).  Patients are able to view lab/test results, encounter notes, upcoming appointments, etc.  Non-urgent messages can be sent to your provider as well.   To learn more about what you can do with MyChart, go to [email or web address].    Your next appointment:   1 year(s)  Provider:   Fransico Him, MD     Other Instructions Dr. Radford Pax has ordered an under-the-nose full face mask for your cpap machine. A member of our sleep team or a representative from your DME company may be reaching out by phone to coordinate this for you.

## 2022-07-12 NOTE — Progress Notes (Signed)
Date:  07/12/2022   ID:  Mackenzie King, Mackenzie King 1940-11-10, MRN [id number]  PCP:  [name], MD  Sleep Medicine:  Fransico Him, MD Electrophysiologist:  None   Chief Complaint:  OSA, HTN  History of Present Illness:    Mackenzie King is a 82 y.o. female  with a hx of mild OSA (AHI 6.35/hr with excessive daytime sleepiness and initial epworth sleepiness score of 15) on CPAP and  HTN.   She is doing well with his PAP device but does not use it every night.  She tolerates the  FF mask but it causes lesions on her skin and mouth at times. She feels the pressure is adequate.  She feels rested in the am and has no significant daytime sleepiness.  She sometimes has mouth and nasal dryness but no nasal congestion.  She does not think that she snores.    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 (Dr Catalina Antigua)   CKD (chronic kidney disease), stage III ([place])    Dr Florene Glen   Colon polyps    hyperplastic   Dermatitis    intermittent Dr Shelia Media   Dyslipidemia    Facet arthropathy, cervical    GERD (gastroesophageal reflux disease)    Hearing loss    Hypertension    Nasal congestion    Obstructive sleep apnea    uses CPAP nightly   Osteoarthritis    low back   Osteopenia    mild   Plantar fasciitis    s/p shock tx Dr Paulla Dolly   Pure hypercholesterolemia    Restless legs syndrome (RLS)    Wheezing    Past Surgical History:  Procedure Laterality Date   BREAST LUMPECTOMY WITH RADIOACTIVE SEED LOCALIZATION Right 03/26/2020   Procedure: RIGHT BREAST LUMPECTOMY WITH RADIOACTIVE SEED LOCALIZATION;  Surgeon: Erroll Luna, MD;  Location: [facility];  Service: General;  Laterality: Right;   BREAST SURGERY  1996   breast biopsy - benign  calcifications   BREAST SURGERY  1977   adenofibroma removed - 3 biopsy's   CHOLECYSTECTOMY  03/19/2012   Procedure: LAPAROSCOPIC CHOLECYSTECTOMY WITH INTRAOPERATIVE CHOLANGIOGRAM;  Surgeon: Adin Hector, MD;  Location: [facility];  Service: General;  Laterality: N/A;   CHOLECYSTECTOMY  03/19/2012   Procedure: CHOLECYSTECTOMY;  Surgeon: Adin Hector, MD;  Location: [facility];  Service: General;  Laterality: N/A;  converted to open @ 1137   EYE SURGERY  02/2012   bilateral cataracts with lens implants   FOOT NEUROMA SURGERY     right foot   LIPOMA EXCISION     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    albuterol (PROVENTIL HFA;VENTOLIN HFA) 108 (90 BASE) MCG/ACT inhaler Inhale 2 puffs into the lungs every 6 (six) hours as needed. For shortness of breath   amLODipine (NORVASC) 2.5 MG tablet Take 2.5 mg by mouth daily.   b complex vitamins tablet Take 1 tablet by mouth daily.   cetirizine (ZYRTEC) 10 MG tablet Take 10  mg by mouth at bedtime.    Cholecalciferol (VITAMIN D) 2000 UNITS tablet Take 2,000 Units by mouth at bedtime.    Coenzyme Q10 (COQ10) 100 MG CAPS Take 1 capsule by mouth daily.   Cranberry-Vitamin C-Vitamin E (CRANBERRY PLUS VITAMIN C) 4200-20-3 MG-MG-UNIT CAPS Take 2 capsules by mouth daily.   famotidine (PEPCID) 20 MG tablet Take 40 mg by mouth as needed for heartburn or indigestion.   fluticasone (FLONASE) 50 MCG/ACT nasal spray Place 2 sprays into the nose daily as needed. For allergies   furosemide (LASIX) 40 MG tablet Take 40 mg by mouth as needed for fluid or edema.   hydrochlorothiazide (HYDRODIURIL) 12.5 MG tablet Take 12.5 mg by mouth every morning.   Melatonin 12 MG TABS Take 1 tablet by mouth as needed.   Multiple Vitamins-Minerals (CENTRUM SILVER 50+WOMEN PO) Take 1 tablet by mouth daily.    pravastatin (PRAVACHOL) 40 MG tablet Take 40 mg by mouth daily.    solifenacin (VESICARE) 5 MG tablet Take 10 mg by mouth daily.    SYMBICORT 160-4.5 MCG/ACT inhaler Place 2 puffs into both nostrils 2 (two) times daily.   valsartan (DIOVAN) 320 MG tablet Take 320 mg by mouth daily.     Allergies:   Crestor [rosuvastatin], Sulfa antibiotics, Ciprofloxacin, Olmesartan, Requip [ropinirole], and Tamoxifen   Social History   Tobacco Use   Smoking status: Never   Smokeless tobacco: Never  Vaping Use   Vaping Use: Never used  Substance Use Topics   Alcohol use: Not Currently   Drug use: No     Family Hx: The patient's family history includes Alzheimer's disease in her mother; Cancer in her mother, paternal grandfather, and sister; Colon polyps in her father; Hypertension in her mother; Stroke in her father; Transient ischemic attack in her mother.  ROS:   Please see the history of present illness.     All other systems reviewed and are negative.   Labs/Other Tests and Data Reviewed:    Recent Labs: No results found for requested labs within last 365 days.   Recent Lipid Panel No results found for: "CHOL", "TRIG", "HDL", "CHOLHDL", "LDLCALC", "LDLDIRECT"  Wt Readings from Last 3 Encounters:  07/12/22 165 lb 6.4 oz (75 kg)  04/21/21 161 lb (73 kg)  04/19/21 159 lb 6.4 oz (72.3 kg)     Objective:    Vital Signs:  BP 114/74   Pulse 65   Ht 5' 2"$  (1.575 m)   Wt 165 lb 6.4 oz (75 kg)   SpO2 98%   BMI 30.25 kg/m   GEN: Well nourished, well developed in no acute distress HEENT: Normal NECK: No JVD; No carotid bruits LYMPHATICS: No lymphadenopathy CARDIAC:RRR, no murmurs, rubs, gallops RESPIRATORY:  Clear to auscultation without rales, wheezing or rhonchi  ABDOMEN: Soft, non-tender, non-distended MUSCULOSKELETAL:  No edema; No deformity  SKIN: Warm and dry NEUROLOGIC:  Alert and oriented x 3 PSYCHIATRIC:  Normal affect  EKG was performed today and demonstrates NSR with  no ST changes  ASSESSMENT & PLAN:    1.   OSA - The patient is tolerating PAP therapy well without any problems. The PAP download performed by his DME was personally reviewed and interpreted by me today and showed an AHI of 1.1 /hr on 8 cm H2O with 43 % compliance in using more than 4 hours nightly.  The patient has been using and benefiting from PAP use and will continue to benefit from therapy.  -she does not always sleep  well at night and so she does not use her device every night -I encouraged her to be more compliant with her device -I am going to get her an under the nose FFM to see if she tolerates that better and get a download in 6 weeks -She also currently does not have a DME so I will set her up with [employer]  2.  HTN -BP controlled on exam -Continue prescription drug management with amlodipine 2.5 mg daily, HCTZ 12.5 mg daily and valsartan 320 mg daily with as needed refills -I have personally reviewed and interpreted outside labs performed by patient's PCP which showed serum creatinine 0.94 and potassium 4.8 on 09/29/2021   Medication Adjustments/Labs and Tests Ordered: Current medicines are reviewed at length with the patient today.  Concerns regarding medicines are outlined above.  Tests Ordered: Orders Placed This Encounter  Procedures   EKG 12-Lead    Medication Changes: No orders of the defined types were placed in this encounter.    Disposition:  Follow up in 1 year(s)  Signed, Fransico Him, MD  07/12/2022 11:31 AM    [facility]

## 2022-07-13 ENCOUNTER — Telehealth: Payer: Self-pay | Admitting: *Deleted

## 2022-07-13 DIAGNOSIS — G4733 Obstructive sleep apnea (adult) (pediatric): Secondary | ICD-10-CM

## 2022-07-13 NOTE — Telephone Encounter (Signed)
Order placed to [employer] via community message. 

## 2022-07-13 NOTE — Telephone Encounter (Signed)
-----   Message from Joni Reining, RN sent at 07/12/2022 11:39 AM EST ----- Regarding: CPAP supplies Per Dr. Radford Pax,  Patient needs an under-the-nose full face mask. (This is the lady who lost her DME company and Dr. Radford Pax wanted you to come talk to her in exam room 1 in pod c today.)  Danae Chen

## 2022-07-21 DIAGNOSIS — E785 Hyperlipidemia, unspecified: Secondary | ICD-10-CM | POA: Diagnosis not present

## 2022-07-21 DIAGNOSIS — N183 Chronic kidney disease, stage 3 unspecified: Secondary | ICD-10-CM | POA: Diagnosis not present

## 2022-07-21 DIAGNOSIS — J4520 Mild intermittent asthma, uncomplicated: Secondary | ICD-10-CM | POA: Diagnosis not present

## 2022-07-21 DIAGNOSIS — K219 Gastro-esophageal reflux disease without esophagitis: Secondary | ICD-10-CM | POA: Diagnosis not present

## 2022-10-19 DIAGNOSIS — M25559 Pain in unspecified hip: Secondary | ICD-10-CM | POA: Diagnosis not present

## 2022-10-19 DIAGNOSIS — N183 Chronic kidney disease, stage 3 unspecified: Secondary | ICD-10-CM | POA: Diagnosis not present

## 2022-10-19 DIAGNOSIS — F439 Reaction to severe stress, unspecified: Secondary | ICD-10-CM | POA: Diagnosis not present

## 2022-10-19 DIAGNOSIS — M791 Myalgia, unspecified site: Secondary | ICD-10-CM | POA: Diagnosis not present

## 2022-10-19 DIAGNOSIS — R7309 Other abnormal glucose: Secondary | ICD-10-CM | POA: Diagnosis not present

## 2022-10-19 DIAGNOSIS — I129 Hypertensive chronic kidney disease with stage 1 through stage 4 chronic kidney disease, or unspecified chronic kidney disease: Secondary | ICD-10-CM | POA: Diagnosis not present

## 2022-10-19 DIAGNOSIS — R413 Other amnesia: Secondary | ICD-10-CM | POA: Diagnosis not present

## 2022-10-19 DIAGNOSIS — E785 Hyperlipidemia, unspecified: Secondary | ICD-10-CM | POA: Diagnosis not present

## 2022-10-19 DIAGNOSIS — N1831 Chronic kidney disease, stage 3a: Secondary | ICD-10-CM | POA: Diagnosis not present

## 2022-10-19 DIAGNOSIS — R7303 Prediabetes: Secondary | ICD-10-CM | POA: Diagnosis not present

## 2022-12-05 DIAGNOSIS — Z961 Presence of intraocular lens: Secondary | ICD-10-CM | POA: Diagnosis not present

## 2022-12-05 DIAGNOSIS — H04123 Dry eye syndrome of bilateral lacrimal glands: Secondary | ICD-10-CM | POA: Diagnosis not present

## 2022-12-05 DIAGNOSIS — H18513 Endothelial corneal dystrophy, bilateral: Secondary | ICD-10-CM | POA: Diagnosis not present

## 2022-12-05 DIAGNOSIS — H4322 Crystalline deposits in vitreous body, left eye: Secondary | ICD-10-CM | POA: Diagnosis not present

## 2022-12-20 DIAGNOSIS — Z1231 Encounter for screening mammogram for malignant neoplasm of breast: Secondary | ICD-10-CM | POA: Diagnosis not present

## 2023-01-16 DIAGNOSIS — N390 Urinary tract infection, site not specified: Secondary | ICD-10-CM | POA: Diagnosis not present

## 2023-02-07 DIAGNOSIS — Z23 Encounter for immunization: Secondary | ICD-10-CM | POA: Diagnosis not present

## 2023-05-02 ENCOUNTER — Other Ambulatory Visit: Payer: Self-pay | Admitting: Family Medicine

## 2023-05-02 DIAGNOSIS — E785 Hyperlipidemia, unspecified: Secondary | ICD-10-CM | POA: Diagnosis not present

## 2023-05-02 DIAGNOSIS — Z905 Acquired absence of kidney: Secondary | ICD-10-CM | POA: Diagnosis not present

## 2023-05-02 DIAGNOSIS — N183 Chronic kidney disease, stage 3 unspecified: Secondary | ICD-10-CM | POA: Diagnosis not present

## 2023-05-02 DIAGNOSIS — R413 Other amnesia: Secondary | ICD-10-CM

## 2023-05-02 DIAGNOSIS — M8588 Other specified disorders of bone density and structure, other site: Secondary | ICD-10-CM | POA: Diagnosis not present

## 2023-05-02 DIAGNOSIS — I129 Hypertensive chronic kidney disease with stage 1 through stage 4 chronic kidney disease, or unspecified chronic kidney disease: Secondary | ICD-10-CM | POA: Diagnosis not present

## 2023-05-02 DIAGNOSIS — Z853 Personal history of malignant neoplasm of breast: Secondary | ICD-10-CM | POA: Diagnosis not present

## 2023-05-02 DIAGNOSIS — R7303 Prediabetes: Secondary | ICD-10-CM | POA: Diagnosis not present

## 2023-05-02 DIAGNOSIS — E041 Nontoxic single thyroid nodule: Secondary | ICD-10-CM | POA: Diagnosis not present

## 2023-05-02 DIAGNOSIS — N3281 Overactive bladder: Secondary | ICD-10-CM | POA: Diagnosis not present

## 2023-05-02 DIAGNOSIS — Z0000 Encounter for general adult medical examination without abnormal findings: Secondary | ICD-10-CM | POA: Diagnosis not present

## 2023-05-02 DIAGNOSIS — Z85528 Personal history of other malignant neoplasm of kidney: Secondary | ICD-10-CM | POA: Diagnosis not present

## 2023-05-02 DIAGNOSIS — G4733 Obstructive sleep apnea (adult) (pediatric): Secondary | ICD-10-CM | POA: Diagnosis not present

## 2023-05-02 DIAGNOSIS — J4520 Mild intermittent asthma, uncomplicated: Secondary | ICD-10-CM | POA: Diagnosis not present

## 2023-05-12 ENCOUNTER — Encounter (HOSPITAL_COMMUNITY): Payer: Self-pay

## 2023-05-12 ENCOUNTER — Other Ambulatory Visit: Payer: Self-pay

## 2023-05-12 ENCOUNTER — Emergency Department (HOSPITAL_COMMUNITY): Payer: Medicare Other

## 2023-05-12 ENCOUNTER — Emergency Department (HOSPITAL_COMMUNITY)
Admission: EM | Admit: 2023-05-12 | Discharge: 2023-05-12 | Disposition: A | Discharge status: Home / Self Care | Payer: Medicare Other

## 2023-05-12 DIAGNOSIS — S0101XA Laceration without foreign body of scalp, initial encounter: Secondary | ICD-10-CM | POA: Diagnosis not present

## 2023-05-12 DIAGNOSIS — E041 Nontoxic single thyroid nodule: Secondary | ICD-10-CM | POA: Insufficient documentation

## 2023-05-12 DIAGNOSIS — S060X0A Concussion without loss of consciousness, initial encounter: Secondary | ICD-10-CM | POA: Insufficient documentation

## 2023-05-12 DIAGNOSIS — W01198A Fall on same level from slipping, tripping and stumbling with subsequent striking against other object, initial encounter: Secondary | ICD-10-CM | POA: Insufficient documentation

## 2023-05-12 DIAGNOSIS — Z79899 Other long term (current) drug therapy: Secondary | ICD-10-CM | POA: Diagnosis not present

## 2023-05-12 DIAGNOSIS — Z23 Encounter for immunization: Secondary | ICD-10-CM | POA: Insufficient documentation

## 2023-05-12 DIAGNOSIS — S0990XA Unspecified injury of head, initial encounter: Secondary | ICD-10-CM | POA: Diagnosis not present

## 2023-05-12 DIAGNOSIS — I10 Essential (primary) hypertension: Secondary | ICD-10-CM | POA: Diagnosis not present

## 2023-05-12 DIAGNOSIS — Y92481 Parking lot as the place of occurrence of the external cause: Secondary | ICD-10-CM | POA: Insufficient documentation

## 2023-05-12 DIAGNOSIS — S199XXA Unspecified injury of neck, initial encounter: Secondary | ICD-10-CM | POA: Diagnosis not present

## 2023-05-12 MED ORDER — HYDROCODONE-ACETAMINOPHEN 5-325 MG PO TABS: 1.0000 | ORAL_TABLET | Freq: Once | ORAL | Status: DC

## 2023-05-12 MED ORDER — TETANUS-DIPHTH-ACELL PERTUSSIS 5-2.5-18.5 LF-MCG/0.5 IM SUSY
0.5000 mL | PREFILLED_SYRINGE | Freq: Once | INTRAMUSCULAR | Status: AC
  Administered 2023-05-12: 0.5 mL via INTRAMUSCULAR
  Filled 2023-05-12: qty 0.5

## 2023-05-12 MED ORDER — ACETAMINOPHEN 325 MG PO TABS
650.0000 mg | ORAL_TABLET | Freq: Once | ORAL | Status: DC
  Filled 2023-05-12: qty 2
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
## 2023-05-12 NOTE — ED Notes (Signed)
Pt head laceration irrigated and cleaned.
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
## 2023-05-12 NOTE — Discharge Instructions (Addendum)
Please follow-up with your primary doctor regarding thyroid nodule found on imaging.

## 2023-05-19 DIAGNOSIS — R413 Other amnesia: Secondary | ICD-10-CM | POA: Diagnosis not present

## 2023-05-19 DIAGNOSIS — S0003XD Contusion of scalp, subsequent encounter: Secondary | ICD-10-CM | POA: Diagnosis not present

## 2023-05-19 DIAGNOSIS — R296 Repeated falls: Secondary | ICD-10-CM | POA: Diagnosis not present

## 2023-05-19 DIAGNOSIS — N3281 Overactive bladder: Secondary | ICD-10-CM | POA: Diagnosis not present

## 2023-05-19 DIAGNOSIS — I129 Hypertensive chronic kidney disease with stage 1 through stage 4 chronic kidney disease, or unspecified chronic kidney disease: Secondary | ICD-10-CM | POA: Diagnosis not present

## 2023-05-19 DIAGNOSIS — E041 Nontoxic single thyroid nodule: Secondary | ICD-10-CM | POA: Diagnosis not present

## 2023-05-19 DIAGNOSIS — R29818 Other symptoms and signs involving the nervous system: Secondary | ICD-10-CM | POA: Diagnosis not present

## 2023-05-22 ENCOUNTER — Other Ambulatory Visit: Payer: Self-pay | Admitting: Family Medicine

## 2023-05-22 DIAGNOSIS — E041 Nontoxic single thyroid nodule: Secondary | ICD-10-CM

## 2023-05-23 ENCOUNTER — Ambulatory Visit
Admission: RE | Admit: 2023-05-23 | Discharge: 2023-05-23 | Disposition: A | Discharge status: Home / Self Care | Payer: Medicare Other | Source: Ambulatory Visit | Attending: Family Medicine | Admitting: Family Medicine

## 2023-05-23 DIAGNOSIS — E042 Nontoxic multinodular goiter: Secondary | ICD-10-CM | POA: Diagnosis not present

## 2023-05-23 DIAGNOSIS — E041 Nontoxic single thyroid nodule: Secondary | ICD-10-CM

## 2023-05-29 DIAGNOSIS — L299 Pruritus, unspecified: Secondary | ICD-10-CM | POA: Diagnosis not present

## 2023-05-29 DIAGNOSIS — J3089 Other allergic rhinitis: Secondary | ICD-10-CM | POA: Diagnosis not present

## 2023-05-29 DIAGNOSIS — H1045 Other chronic allergic conjunctivitis: Secondary | ICD-10-CM | POA: Diagnosis not present

## 2023-05-29 DIAGNOSIS — J4540 Moderate persistent asthma, uncomplicated: Secondary | ICD-10-CM | POA: Diagnosis not present

## 2023-06-08 DIAGNOSIS — N3946 Mixed incontinence: Secondary | ICD-10-CM | POA: Diagnosis not present

## 2023-06-08 DIAGNOSIS — Z85528 Personal history of other malignant neoplasm of kidney: Secondary | ICD-10-CM | POA: Diagnosis not present

## 2023-06-17 ENCOUNTER — Ambulatory Visit
Admission: RE | Admit: 2023-06-17 | Discharge: 2023-06-17 | Disposition: A | Discharge status: Home / Self Care | Payer: Medicare Other | Source: Ambulatory Visit | Attending: Family Medicine | Admitting: Family Medicine

## 2023-06-17 DIAGNOSIS — I6782 Cerebral ischemia: Secondary | ICD-10-CM | POA: Diagnosis not present

## 2023-06-17 DIAGNOSIS — R413 Other amnesia: Secondary | ICD-10-CM

## 2023-06-17 DIAGNOSIS — R410 Disorientation, unspecified: Secondary | ICD-10-CM | POA: Diagnosis not present

## 2023-06-17 MED ORDER — GADOPICLENOL 0.5 MMOL/ML IV SOLN
7.5000 mL | Freq: Once | INTRAVENOUS | Status: AC | PRN
Start: 2023-06-17 — End: 2023-06-17
  Administered 2023-06-17: 7.5 mL via INTRAVENOUS

## 2023-06-26 DIAGNOSIS — R29818 Other symptoms and signs involving the nervous system: Secondary | ICD-10-CM | POA: Diagnosis not present

## 2023-06-26 DIAGNOSIS — R296 Repeated falls: Secondary | ICD-10-CM | POA: Diagnosis not present

## 2023-06-29 DIAGNOSIS — R296 Repeated falls: Secondary | ICD-10-CM | POA: Diagnosis not present

## 2023-06-29 DIAGNOSIS — R29818 Other symptoms and signs involving the nervous system: Secondary | ICD-10-CM | POA: Diagnosis not present

## 2023-07-03 DIAGNOSIS — R29818 Other symptoms and signs involving the nervous system: Secondary | ICD-10-CM | POA: Diagnosis not present

## 2023-07-03 DIAGNOSIS — R296 Repeated falls: Secondary | ICD-10-CM | POA: Diagnosis not present

## 2023-07-06 DIAGNOSIS — R296 Repeated falls: Secondary | ICD-10-CM | POA: Diagnosis not present

## 2023-07-06 DIAGNOSIS — R29818 Other symptoms and signs involving the nervous system: Secondary | ICD-10-CM | POA: Diagnosis not present

## 2023-07-10 DIAGNOSIS — R29818 Other symptoms and signs involving the nervous system: Secondary | ICD-10-CM | POA: Diagnosis not present

## 2023-07-10 DIAGNOSIS — R296 Repeated falls: Secondary | ICD-10-CM | POA: Diagnosis not present

## 2023-07-17 DIAGNOSIS — R29818 Other symptoms and signs involving the nervous system: Secondary | ICD-10-CM | POA: Diagnosis not present

## 2023-07-17 DIAGNOSIS — R296 Repeated falls: Secondary | ICD-10-CM | POA: Diagnosis not present

## 2023-07-20 DIAGNOSIS — R296 Repeated falls: Secondary | ICD-10-CM | POA: Diagnosis not present

## 2023-07-20 DIAGNOSIS — R29818 Other symptoms and signs involving the nervous system: Secondary | ICD-10-CM | POA: Diagnosis not present

## 2023-08-04 DIAGNOSIS — N183 Chronic kidney disease, stage 3 unspecified: Secondary | ICD-10-CM | POA: Diagnosis not present

## 2023-08-04 DIAGNOSIS — I129 Hypertensive chronic kidney disease with stage 1 through stage 4 chronic kidney disease, or unspecified chronic kidney disease: Secondary | ICD-10-CM | POA: Diagnosis not present

## 2023-08-04 DIAGNOSIS — R42 Dizziness and giddiness: Secondary | ICD-10-CM | POA: Diagnosis not present

## 2023-08-04 DIAGNOSIS — R413 Other amnesia: Secondary | ICD-10-CM | POA: Diagnosis not present

## 2023-08-04 DIAGNOSIS — E785 Hyperlipidemia, unspecified: Secondary | ICD-10-CM | POA: Diagnosis not present

## 2023-08-14 DIAGNOSIS — I129 Hypertensive chronic kidney disease with stage 1 through stage 4 chronic kidney disease, or unspecified chronic kidney disease: Secondary | ICD-10-CM | POA: Diagnosis not present

## 2023-08-14 DIAGNOSIS — N183 Chronic kidney disease, stage 3 unspecified: Secondary | ICD-10-CM | POA: Diagnosis not present

## 2023-08-15 ENCOUNTER — Encounter: Payer: Self-pay | Admitting: Neurology

## 2023-08-15 ENCOUNTER — Ambulatory Visit (INDEPENDENT_AMBULATORY_CARE_PROVIDER_SITE_OTHER): Payer: Medicare Other | Admitting: Neurology

## 2023-08-15 VITALS — BP 136/74 | HR 86 | Ht 62.5 in | Wt 160.0 lb

## 2023-08-15 DIAGNOSIS — R419 Unspecified symptoms and signs involving cognitive functions and awareness: Secondary | ICD-10-CM | POA: Diagnosis not present

## 2023-08-15 NOTE — Patient Instructions (Addendum)
 Continue current medications  Will check TSH and B12 level  Continue to follow up with PCP  Return as needed    There are well-accepted and sensible ways to reduce risk for Alzheimers disease and other degenerative brain disorders .  Exercise Daily Walk A daily 20 minute walk should be part of your routine. Disease related apathy can be a significant roadblock to exercise and the only way to overcome this is to make it a daily routine and perhaps have a reward at the end (something your loved one loves to eat or drink perhaps) or a personal trainer coming to the home can also be very useful. Most importantly, the patient is much more likely to exercise if the caregiver / spouse does it with him/her. In general a structured, repetitive schedule is best.  General Health: Any diseases which effect your body will effect your brain such as a pneumonia, urinary infection, blood clot, heart attack or stroke. Keep contact with your primary care doctor for regular follow ups.  Sleep. A good nights sleep is healthy for the brain. Seven hours is recommended. If you have insomnia or poor sleep habits we can give you some instructions. If you have sleep apnea wear your mask.  Diet: Eating a heart healthy diet is also a good idea; fish and poultry instead of red meat, nuts (mostly non-peanuts), vegetables, fruits, olive oil or canola oil (instead of butter), minimal salt (use other spices to flavor foods), whole grain rice, bread, cereal and pasta and wine in moderation.Research is now showing that the MIND diet, which is a combination of The Mediterranean diet and the DASH diet, is beneficial for cognitive processing and longevity. Information about this diet can be found in The MIND Diet, a book by Alonna Minium, MS, RDN, and online at [employer]  Finances, Power of [address] and Advance Directives: You should consider putting legal safeguards in place with regard to financial  and medical decision making. While the spouse always has power of attorney for medical and financial issues in the absence of any form, you should consider what you want in case the spouse / caregiver is no longer around or capable of making decisions.

## 2023-08-15 NOTE — Progress Notes (Signed)
 [facility]  PATIENT: Mackenzie MCCORVEY DOB: [birth year]  REQUESTING CLINICIAN: Laurann Montana, MD HISTORY FROM: Patient and daughter  REASON FOR VISIT: Memory loss    HISTORICAL  CHIEF COMPLAINT:  Chief Complaint  Patient presents with   New Patient (Initial Visit)    Pt in 12, here with daughter Mervyn Gay  Pt is referred by Dr Laurann Montana for memory changes.     HISTORY OF PRESENT ILLNESS:  This is a 83 year old woman past medical history of hypertension, hyperlipidemia, asthma, hearing loss who is presenting with memory concern for the past several months.  Patient tells me that she cannot remember like previously.  Daughter tells me that her she patient is forgetful, mainly her short-term memory.  She cannot remember her recent Bible studies.  Daughter tells me that around Thanksgiving, she did not know that she had a deep fryer and her son was able to help her look for it. During christmas, she forget to make sausage balls and she has made those sausage balls all her life.  Other than those 2 events, daughter stated patient is not repetitive, she does not ask the same questions over and over and does not miss important appointment, and important dates and she is independent all actives of daily living, she lives alone.    TBI:   No past history of TBI Stroke:   no past history of stroke Seizures:   no past history of seizures Sleep: Yes, uses CPAP  Mood: No Family history of Dementia: Mother with dementia   Functional status: independent in all ADLs and IADLs Patient lives with alone. Cooking: no issues Cleaning: no issues Shopping: no issues Bathing: no issues Toileting: no issues Driving: no issues  Bills: no issues  Medications: no issues Ever left the stove on by accident?: denies Forget how to use items around the house?: denies Getting lost going to familiar places?: denies Forgetting loved ones names?: Denies  Word finding difficulty? Yes   Sleep: ok   OTHER MEDICAL CONDITIONS: Hypertension, Hyperlipidemia, hearing loss, Asthma.    REVIEW OF SYSTEMS: Full 14 system review of systems performed and negative with exception of: As noted in the HPI   ALLERGIES: Allergies  Allergen Reactions   Crestor [Rosuvastatin] Other (See Comments)    Muscle aches, constipation.   Sulfa Antibiotics Itching and Swelling    Face only, breathing not affected.   Ciprofloxacin Other (See Comments)    Pt states that it made her have tremors.   Olmesartan Other (See Comments)   Requip [Ropinirole]     Other reaction(s): made restless leg worse   Tamoxifen     Other reaction(s): recurrent UTIs    HOME MEDICATIONS: Outpatient Medications Prior to Visit  Medication Sig Dispense Refill   acetaminophen (TYLENOL) 650 MG CR tablet Take 650 mg by mouth daily.      albuterol (PROVENTIL HFA;VENTOLIN HFA) 108 (90 BASE) MCG/ACT inhaler Inhale 2 puffs into the lungs every 6 (six) hours as needed. For shortness of breath     amLODipine (NORVASC) 2.5 MG tablet Take 2.5 mg by mouth daily.     b complex vitamins tablet Take 1 tablet by mouth daily.     cetirizine (ZYRTEC) 10 MG tablet Take 10 mg by mouth at bedtime.      Cholecalciferol (VITAMIN D) 2000 UNITS tablet Take 2,000 Units by mouth at bedtime.      Coenzyme Q10 (COQ10) 100 MG CAPS Take 1 capsule by mouth daily.  Cranberry-Vitamin C-Vitamin E (CRANBERRY PLUS VITAMIN C) 4200-20-3 MG-MG-UNIT CAPS Take 2 capsules by mouth daily.     famotidine (PEPCID) 20 MG tablet Take 40 mg by mouth as needed for heartburn or indigestion.     fluticasone (FLONASE) 50 MCG/ACT nasal spray Place 2 sprays into the nose daily as needed. For allergies     furosemide (LASIX) 40 MG tablet Take 40 mg by mouth as needed for fluid or edema.     hydrochlorothiazide (HYDRODIURIL) 12.5 MG tablet Take 12.5 mg by mouth every morning.     Melatonin 12 MG TABS Take 1 tablet by mouth as needed.     Multiple Vitamins-Minerals  (CENTRUM SILVER 50+WOMEN PO) Take 1 tablet by mouth daily.     pravastatin (PRAVACHOL) 40 MG tablet Take 40 mg by mouth daily.      solifenacin (VESICARE) 5 MG tablet Take 10 mg by mouth daily.      SYMBICORT 160-4.5 MCG/ACT inhaler Place 2 puffs into both nostrils 2 (two) times daily.     valsartan (DIOVAN) 320 MG tablet Take 320 mg by mouth daily.     No facility-administered medications prior to visit.    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)   CKD (chronic kidney disease), stage III (HCC)    Dr Lowell Guitar   Colon polyps    hyperplastic   Dermatitis    intermittent Dr Jerold Coombe   Dyslipidemia    Facet arthropathy, cervical    GERD (gastroesophageal reflux disease)    Hearing loss    Hypertension    Nasal congestion    Obstructive sleep apnea    uses CPAP nightly   Osteoarthritis    low back   Osteopenia    mild   Plantar fasciitis    s/p shock tx Dr Charlsie Merles   Pure hypercholesterolemia    Restless legs syndrome (RLS)    Wheezing     PAST SURGICAL HISTORY: Past Surgical History:  Procedure Laterality Date   BREAST LUMPECTOMY WITH RADIOACTIVE SEED LOCALIZATION Right 03/26/2020   Procedure: RIGHT BREAST LUMPECTOMY WITH RADIOACTIVE SEED LOCALIZATION;  Surgeon: Harriette Bouillon, MD;  Location: [facility];  Service: General;  Laterality: Right;   BREAST SURGERY  1996   breast biopsy - benign calcifications   BREAST SURGERY  1977   adenofibroma removed - 3 biopsy's   CHOLECYSTECTOMY  03/19/2012   Procedure: LAPAROSCOPIC CHOLECYSTECTOMY WITH INTRAOPERATIVE CHOLANGIOGRAM;  Surgeon: Ernestene Mention, MD;  Location: [facility];  Service: General;  Laterality: N/A;   CHOLECYSTECTOMY  03/19/2012   Procedure: CHOLECYSTECTOMY;  Surgeon: Ernestene Mention, MD;  Location: [facility];  Service: General;   Laterality: N/A;  converted to open @ 1137   EYE SURGERY  02/2012   bilateral cataracts with lens implants   FOOT NEUROMA SURGERY     right foot   LIPOMA EXCISION     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   TUBAL LIGATION  1971    FAMILY HISTORY: Family History  Problem Relation Age of Onset   Transient ischemic attack Mother    Cancer Mother        breast   Hypertension Mother    Alzheimer's disease Mother    Stroke Father  Colon polyps Father    Cancer Sister    Cancer Paternal Grandfather        lung    SOCIAL HISTORY: Social History   Socioeconomic History   Marital status: Widowed    Spouse name: Not on file   Number of children: Not on file   Years of education: Not on file   Highest education level: Not on file  Occupational History   Not on file  Tobacco Use   Smoking status: Never   Smokeless tobacco: Never  Vaping Use   Vaping status: Never Used  Substance and Sexual Activity   Alcohol use: Not Currently   Drug use: No   Sexual activity: Not Currently    Birth control/protection: Post-menopausal  Other Topics Concern   Not on file  Social History Narrative   Not on file   Social Drivers of Health   Financial Resource Strain: Not on file  Food Insecurity: Not on file  Transportation Needs: Not on file  Physical Activity: Not on file  Stress: Not on file  Social Connections: Not on file  Intimate Partner Violence: Not on file    PHYSICAL EXAM  GENERAL EXAM/CONSTITUTIONAL: Vitals:  Vitals:   08/15/23 1302  BP: 136/74  Pulse: 86  Weight: 160 lb (72.6 kg)  Height: 5' 2.5" (1.588 m)   Body mass index is 28.8 kg/m. Wt Readings from Last 3 Encounters:  08/15/23 160 lb (72.6 kg)  05/12/23 160 lb (72.6 kg)  07/12/22 165 lb 6.4 oz (75 kg)   Patient is in no distress; well developed, nourished and groomed; neck is supple  MUSCULOSKELETAL: Gait, strength, tone, movements noted in Neurologic exam  below  NEUROLOGIC: MENTAL STATUS:     08/15/2023    1:06 PM  MMSE - Mini Mental State Exam  Orientation to time 5  Orientation to Place 5  Registration 3  Attention/ Calculation 5  Recall 0  Language- name 2 objects 2  Language- repeat 1  Language- follow 3 step command 3  Language- read & follow direction 1  Write a sentence 1  Copy design 0  Total score 26   awake, alert, oriented to person, place and time recent and remote memory intact normal attention and concentration language fluent, comprehension intact, naming intact fund of knowledge appropriate  CRANIAL NERVE:  2nd, 3rd, 4th, 6th- visual fields full to confrontation, extraocular muscles intact, no nystagmus 5th - facial sensation symmetric 7th - facial strength symmetric 8th - hearing intact 9th - palate elevates symmetrically, uvula midline 11th - shoulder shrug symmetric 12th - tongue protrusion midline  MOTOR:  normal bulk and tone, full strength in the BUE, BLE  SENSORY:  normal and symmetric to light touch  COORDINATION:  finger-nose-finger, fine finger movements normal  GAIT/STATION:  normal   DIAGNOSTIC DATA (LABS, IMAGING, TESTING) - I reviewed patient records, labs, notes, testing and imaging myself where available.  Lab Results  Component Value Date   WBC 8.3 03/23/2020   HGB 12.0 03/23/2020   HCT 36.7 03/23/2020   MCV 94.1 03/23/2020   PLT 342 03/23/2020      Component Value Date/Time   NA 137 03/23/2020 1100   K 4.6 03/23/2020 1100   CL 104 03/23/2020 1100   CO2 26 03/23/2020 1100   GLUCOSE 89 03/23/2020 1100   BUN 12 03/23/2020 1100   CREATININE 1.01 (H) 03/23/2020 1100   CALCIUM 9.2 03/23/2020 1100   PROT 5.7 (L) 03/23/2020 1100   ALBUMIN 3.6  03/23/2020 1100   AST 17 03/23/2020 1100   ALT 16 03/23/2020 1100   ALKPHOS 70 03/23/2020 1100   BILITOT 0.6 03/23/2020 1100   GFRNONAA 56 (L) 03/23/2020 1100   GFRAA 62 (L) 03/20/2012 0413   No results found for: "CHOL",  "HDL", "LDLCALC", "LDLDIRECT", "TRIG", "CHOLHDL" No results found for: "HGBA1C" No results found for: "VITAMINB12" No results found for: "TSH"  MRI Brain 06/17/2023 1. No specific or reversible cause for symptoms. Preserved brain volume for age. 2. 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 year old female with history of hypertension, hyperlipidemia, hearing loss, asthma who is presenting with her daughter with complaints of memory loss, mainly short-term memory.  Other than that she is independent in all activities of daily living, she lives alone and scored 26 on the MMSE.  I have informed patient that she likely has normal cognition, will obtain B12 and TSH to complete the workup but no recommendation for medication.  Advised her to continue with her exercise, continue with reading and doing some word search, surgical, crossword puzzle.  She voiced understanding and comfortable with plans.  Continue to follow-up with PCP and return as needed.   1. Cognitive complaints with normal exam      Patient Instructions  Continue current medications  Will check TSH and B12 level  Continue to follow up with PCP  Return as needed    There are well-accepted and sensible ways to reduce risk for Alzheimers disease and other degenerative brain disorders .  Exercise Daily Walk A daily 20 minute walk should be part of your routine. Disease related apathy can be a significant roadblock to exercise and the only way to overcome this is to make it a daily routine and perhaps have a reward at the end (something your loved one loves to eat or drink perhaps) or a personal trainer coming to the home can also be very useful. Most importantly, the patient is much more likely to exercise if the caregiver / spouse does it with him/her. In general a structured, repetitive schedule is best.  General Health: Any diseases which effect your body will effect  your brain such as a pneumonia, urinary infection, blood clot, heart attack or stroke. Keep contact with your primary care doctor for regular follow ups.  Sleep. A good nights sleep is healthy for the brain. Seven hours is recommended. If you have insomnia or poor sleep habits we can give you some instructions. If you have sleep apnea wear your mask.  Diet: Eating a heart healthy diet is also a good idea; fish and poultry instead of red meat, nuts (mostly non-peanuts), vegetables, fruits, olive oil or canola oil (instead of butter), minimal salt (use other spices to flavor foods), whole grain rice, bread, cereal and pasta and wine in moderation.Research is now showing that the MIND diet, which is a combination of The Mediterranean diet and the DASH diet, is beneficial for cognitive processing and longevity. Information about this diet can be found in The MIND Diet, a book by Alonna Minium, MS, RDN, and online at [employer]  Finances, Power of [address] and Advance Directives: You should consider putting legal safeguards in place with regard to financial and medical decision making. While the spouse always has power of attorney for medical and financial issues in the absence of any form, you should consider what you want in case the spouse / caregiver is no longer  around or capable of making decisions.   Orders Placed This Encounter  Procedures   TSH   Vitamin B12    No orders of the defined types were placed in this encounter.   Return if symptoms worsen or fail to improve.  I have spent a total of 60 minutes dedicated to this patient today, preparing to see patient, performing a medically appropriate examination and evaluation, ordering tests and/or medications and procedures, and counseling and educating the patient/family/caregiver; independently interpreting result and communicating results to the family/patient/caregiver; and documenting clinical information  in the electronic medical record.  Windell Norfolk, MD 08/15/2023, 2:03 PM  [facility] [address] [place], [place] [address] [phone]

## 2023-08-16 ENCOUNTER — Encounter: Payer: Self-pay | Admitting: Neurology

## 2023-08-16 ENCOUNTER — Telehealth: Payer: Self-pay | Admitting: Anesthesiology

## 2023-08-16 LAB — VITAMIN B12: Vitamin B-12: 1030 pg/mL (ref 232–1245)

## 2023-08-16 LAB — TSH: TSH: 0.472 u[IU]/mL (ref 0.450–4.500)

## 2023-08-16 NOTE — Telephone Encounter (Signed)
 Marland Kitchen

## 2023-08-21 DIAGNOSIS — Z853 Personal history of malignant neoplasm of breast: Secondary | ICD-10-CM | POA: Diagnosis not present

## 2023-08-21 DIAGNOSIS — N183 Chronic kidney disease, stage 3 unspecified: Secondary | ICD-10-CM | POA: Diagnosis not present

## 2023-08-21 DIAGNOSIS — J4520 Mild intermittent asthma, uncomplicated: Secondary | ICD-10-CM | POA: Diagnosis not present

## 2023-08-21 DIAGNOSIS — E785 Hyperlipidemia, unspecified: Secondary | ICD-10-CM | POA: Diagnosis not present

## 2023-09-12 DIAGNOSIS — N183 Chronic kidney disease, stage 3 unspecified: Secondary | ICD-10-CM | POA: Diagnosis not present

## 2023-09-12 DIAGNOSIS — I129 Hypertensive chronic kidney disease with stage 1 through stage 4 chronic kidney disease, or unspecified chronic kidney disease: Secondary | ICD-10-CM | POA: Diagnosis not present

## 2023-09-20 DIAGNOSIS — I129 Hypertensive chronic kidney disease with stage 1 through stage 4 chronic kidney disease, or unspecified chronic kidney disease: Secondary | ICD-10-CM | POA: Diagnosis not present

## 2023-09-20 DIAGNOSIS — N183 Chronic kidney disease, stage 3 unspecified: Secondary | ICD-10-CM | POA: Diagnosis not present

## 2023-09-20 DIAGNOSIS — J4520 Mild intermittent asthma, uncomplicated: Secondary | ICD-10-CM | POA: Diagnosis not present

## 2023-09-20 DIAGNOSIS — E785 Hyperlipidemia, unspecified: Secondary | ICD-10-CM | POA: Diagnosis not present

## 2023-09-20 DIAGNOSIS — Z853 Personal history of malignant neoplasm of breast: Secondary | ICD-10-CM | POA: Diagnosis not present

## 2023-10-12 DIAGNOSIS — I129 Hypertensive chronic kidney disease with stage 1 through stage 4 chronic kidney disease, or unspecified chronic kidney disease: Secondary | ICD-10-CM | POA: Diagnosis not present

## 2023-10-12 DIAGNOSIS — N183 Chronic kidney disease, stage 3 unspecified: Secondary | ICD-10-CM | POA: Diagnosis not present

## 2023-10-21 DIAGNOSIS — E785 Hyperlipidemia, unspecified: Secondary | ICD-10-CM | POA: Diagnosis not present

## 2023-10-21 DIAGNOSIS — J4520 Mild intermittent asthma, uncomplicated: Secondary | ICD-10-CM | POA: Diagnosis not present

## 2023-10-21 DIAGNOSIS — I129 Hypertensive chronic kidney disease with stage 1 through stage 4 chronic kidney disease, or unspecified chronic kidney disease: Secondary | ICD-10-CM | POA: Diagnosis not present

## 2023-10-21 DIAGNOSIS — Z853 Personal history of malignant neoplasm of breast: Secondary | ICD-10-CM | POA: Diagnosis not present

## 2023-10-21 DIAGNOSIS — N183 Chronic kidney disease, stage 3 unspecified: Secondary | ICD-10-CM | POA: Diagnosis not present

## 2023-11-11 DIAGNOSIS — N183 Chronic kidney disease, stage 3 unspecified: Secondary | ICD-10-CM | POA: Diagnosis not present

## 2023-11-11 DIAGNOSIS — I129 Hypertensive chronic kidney disease with stage 1 through stage 4 chronic kidney disease, or unspecified chronic kidney disease: Secondary | ICD-10-CM | POA: Diagnosis not present

## 2023-11-20 DIAGNOSIS — E785 Hyperlipidemia, unspecified: Secondary | ICD-10-CM | POA: Diagnosis not present

## 2023-11-20 DIAGNOSIS — Z853 Personal history of malignant neoplasm of breast: Secondary | ICD-10-CM | POA: Diagnosis not present

## 2023-11-20 DIAGNOSIS — N183 Chronic kidney disease, stage 3 unspecified: Secondary | ICD-10-CM | POA: Diagnosis not present

## 2023-11-20 DIAGNOSIS — J4520 Mild intermittent asthma, uncomplicated: Secondary | ICD-10-CM | POA: Diagnosis not present

## 2023-11-28 IMAGING — DX DG FOOT COMPLETE 3+V*R*
3 series · 3 of 3 positions shown · non-contrast
Comparison: 02/15/2017

CLINICAL DATA: Right third and fourth metatarsal pain, recent fall

EXAM:
RIGHT FOOT COMPLETE - 3+ VIEW

[dg foot complete right (1 of 3)]
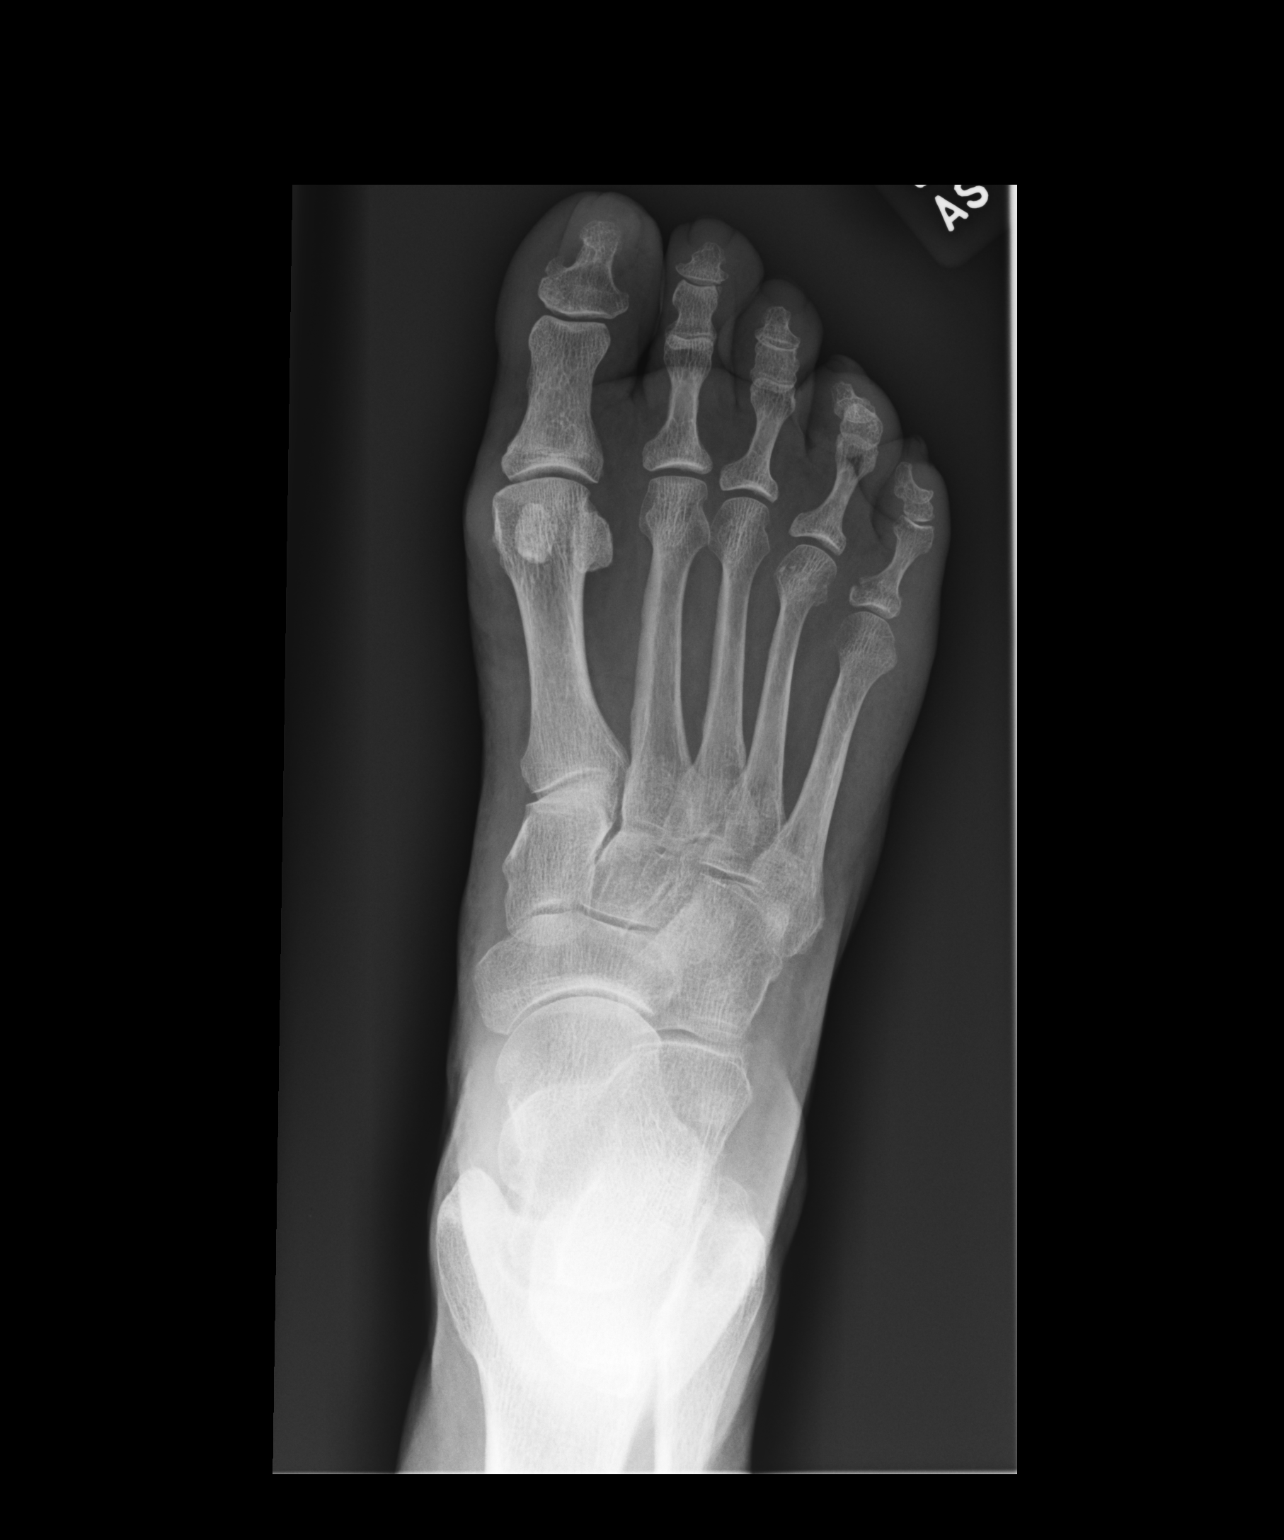

[dg foot complete right (2 of 3)]
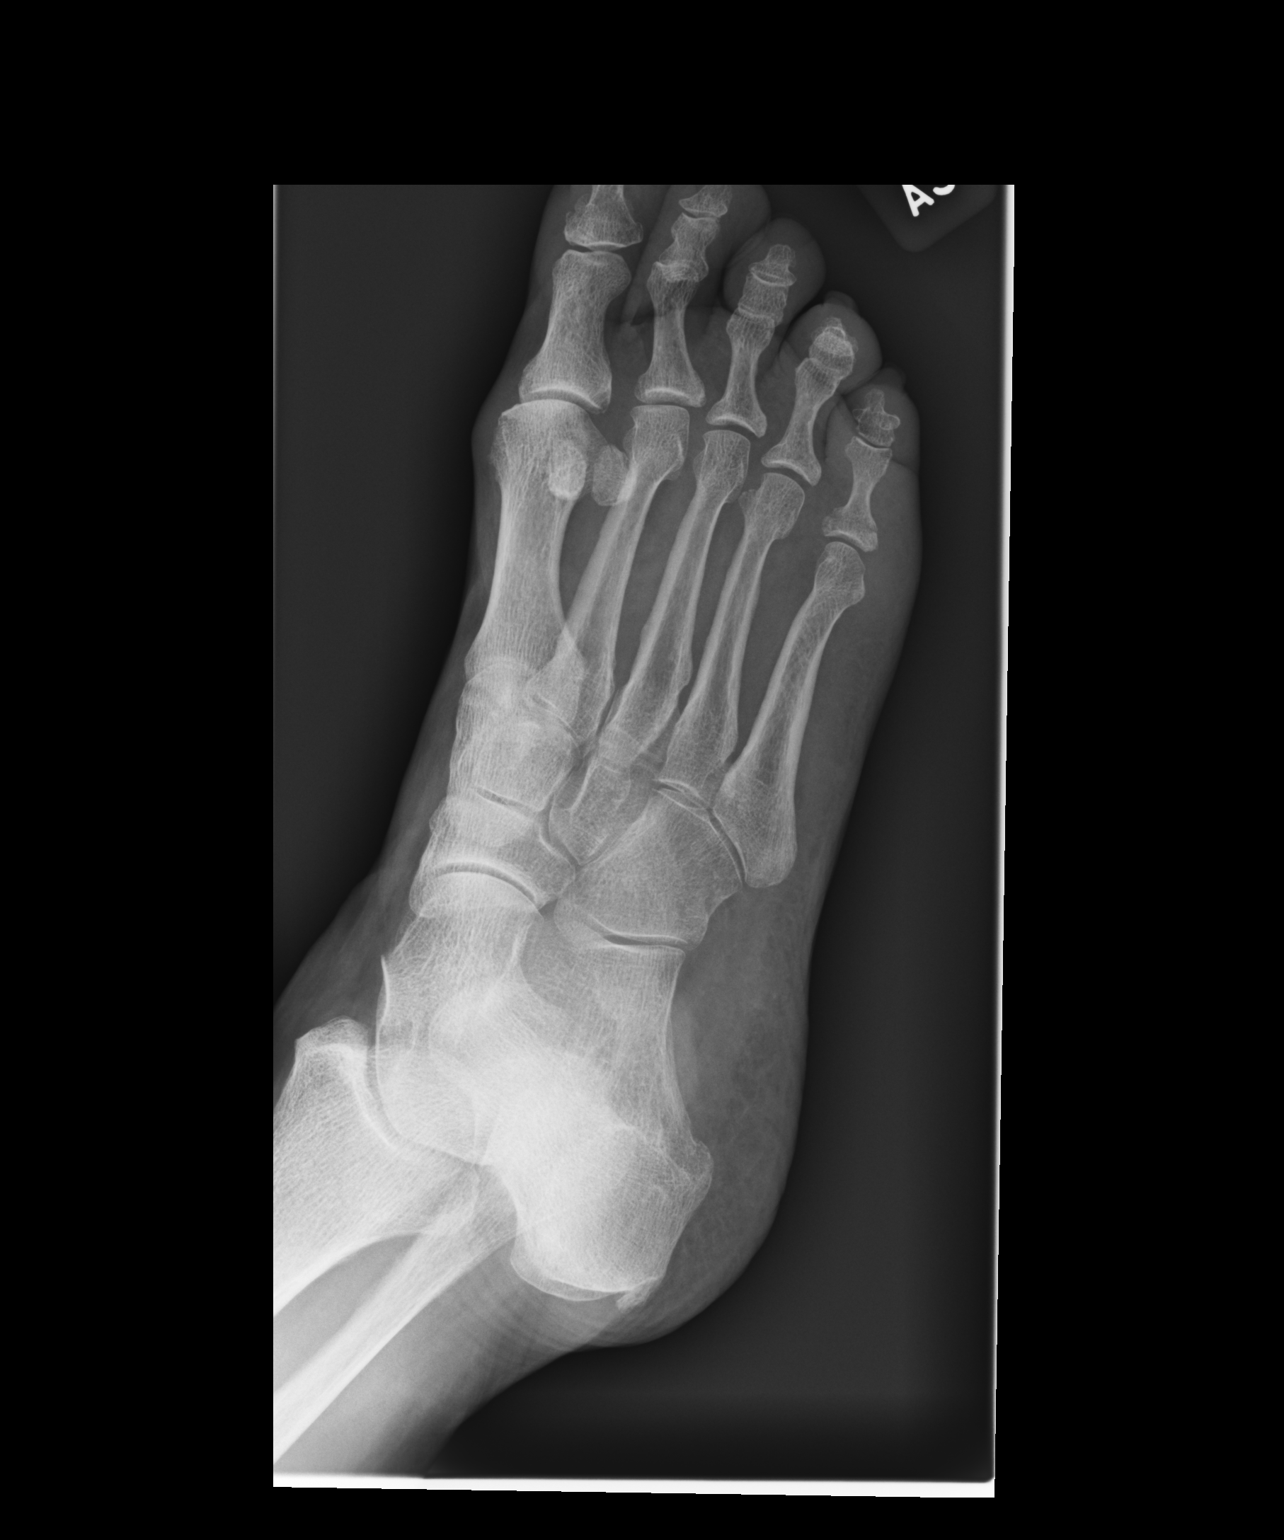

[dg foot complete right (3 of 3)]
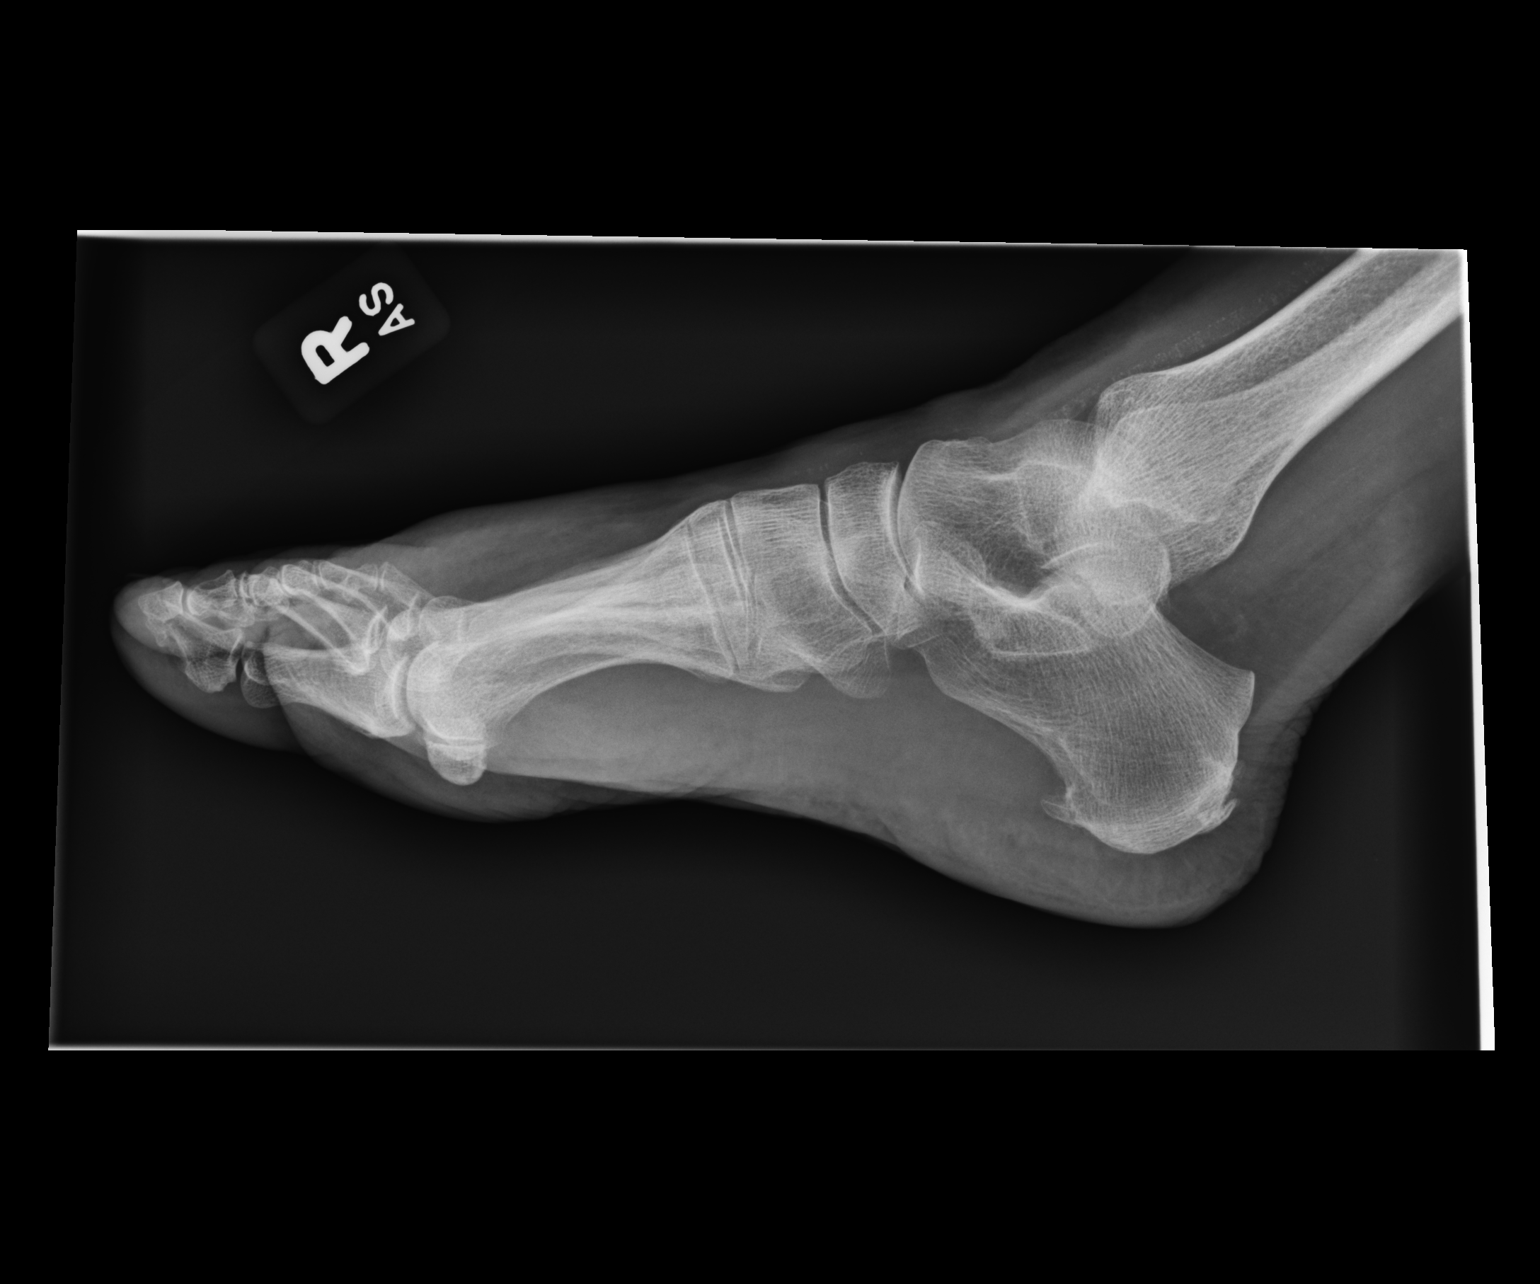

[3 of 3 positions shown; findings below may reference images not displayed]

FINDINGS: There is no evidence of fracture or dislocation. Bidirectional
calcaneal enthesophytes. The joint spaces are relatively well
preserved. There is no evidence of arthropathy or other focal bone
abnormality. Vascular calcifications. No evidence of soft tissue
injury.
IMPRESSION: Negative.

## 2023-12-08 DIAGNOSIS — I129 Hypertensive chronic kidney disease with stage 1 through stage 4 chronic kidney disease, or unspecified chronic kidney disease: Secondary | ICD-10-CM | POA: Diagnosis not present

## 2023-12-08 DIAGNOSIS — R413 Other amnesia: Secondary | ICD-10-CM | POA: Diagnosis not present

## 2023-12-08 DIAGNOSIS — E785 Hyperlipidemia, unspecified: Secondary | ICD-10-CM | POA: Diagnosis not present

## 2023-12-08 DIAGNOSIS — N183 Chronic kidney disease, stage 3 unspecified: Secondary | ICD-10-CM | POA: Diagnosis not present

## 2023-12-11 DIAGNOSIS — I129 Hypertensive chronic kidney disease with stage 1 through stage 4 chronic kidney disease, or unspecified chronic kidney disease: Secondary | ICD-10-CM | POA: Diagnosis not present

## 2023-12-11 DIAGNOSIS — N183 Chronic kidney disease, stage 3 unspecified: Secondary | ICD-10-CM | POA: Diagnosis not present

## 2023-12-21 DIAGNOSIS — J4520 Mild intermittent asthma, uncomplicated: Secondary | ICD-10-CM | POA: Diagnosis not present

## 2023-12-21 DIAGNOSIS — Z853 Personal history of malignant neoplasm of breast: Secondary | ICD-10-CM | POA: Diagnosis not present

## 2023-12-21 DIAGNOSIS — N183 Chronic kidney disease, stage 3 unspecified: Secondary | ICD-10-CM | POA: Diagnosis not present

## 2023-12-21 DIAGNOSIS — E785 Hyperlipidemia, unspecified: Secondary | ICD-10-CM | POA: Diagnosis not present

## 2023-12-21 DIAGNOSIS — I129 Hypertensive chronic kidney disease with stage 1 through stage 4 chronic kidney disease, or unspecified chronic kidney disease: Secondary | ICD-10-CM | POA: Diagnosis not present

## 2024-01-05 DIAGNOSIS — Z1231 Encounter for screening mammogram for malignant neoplasm of breast: Secondary | ICD-10-CM | POA: Diagnosis not present

## 2024-01-10 DIAGNOSIS — I129 Hypertensive chronic kidney disease with stage 1 through stage 4 chronic kidney disease, or unspecified chronic kidney disease: Secondary | ICD-10-CM | POA: Diagnosis not present

## 2024-01-10 DIAGNOSIS — N183 Chronic kidney disease, stage 3 unspecified: Secondary | ICD-10-CM | POA: Diagnosis not present

## 2024-01-11 DIAGNOSIS — H43811 Vitreous degeneration, right eye: Secondary | ICD-10-CM | POA: Diagnosis not present

## 2024-01-11 DIAGNOSIS — H4322 Crystalline deposits in vitreous body, left eye: Secondary | ICD-10-CM | POA: Diagnosis not present

## 2024-01-11 DIAGNOSIS — H18513 Endothelial corneal dystrophy, bilateral: Secondary | ICD-10-CM | POA: Diagnosis not present

## 2024-01-11 DIAGNOSIS — H04123 Dry eye syndrome of bilateral lacrimal glands: Secondary | ICD-10-CM | POA: Diagnosis not present

## 2024-01-21 DIAGNOSIS — Z853 Personal history of malignant neoplasm of breast: Secondary | ICD-10-CM | POA: Diagnosis not present

## 2024-01-21 DIAGNOSIS — N183 Chronic kidney disease, stage 3 unspecified: Secondary | ICD-10-CM | POA: Diagnosis not present

## 2024-01-21 DIAGNOSIS — J4520 Mild intermittent asthma, uncomplicated: Secondary | ICD-10-CM | POA: Diagnosis not present

## 2024-01-21 DIAGNOSIS — E785 Hyperlipidemia, unspecified: Secondary | ICD-10-CM | POA: Diagnosis not present

## 2024-01-21 DIAGNOSIS — I129 Hypertensive chronic kidney disease with stage 1 through stage 4 chronic kidney disease, or unspecified chronic kidney disease: Secondary | ICD-10-CM | POA: Diagnosis not present

## 2024-02-09 DIAGNOSIS — N183 Chronic kidney disease, stage 3 unspecified: Secondary | ICD-10-CM | POA: Diagnosis not present

## 2024-02-09 DIAGNOSIS — I129 Hypertensive chronic kidney disease with stage 1 through stage 4 chronic kidney disease, or unspecified chronic kidney disease: Secondary | ICD-10-CM | POA: Diagnosis not present

## 2024-02-10 ENCOUNTER — Encounter: Payer: Self-pay | Admitting: *Deleted

## 2024-02-10 ENCOUNTER — Ambulatory Visit
Admission: EM | Admit: 2024-02-10 | Discharge: 2024-02-10 | Disposition: A | Discharge status: Home / Self Care | Attending: Emergency Medicine | Admitting: Emergency Medicine

## 2024-02-10 ENCOUNTER — Other Ambulatory Visit: Payer: Self-pay

## 2024-02-10 DIAGNOSIS — N3001 Acute cystitis with hematuria: Secondary | ICD-10-CM

## 2024-02-10 DIAGNOSIS — R300 Dysuria: Secondary | ICD-10-CM | POA: Diagnosis not present

## 2024-02-10 LAB — POCT URINE DIPSTICK
Bilirubin, UA: NEGATIVE
Glucose, UA: NEGATIVE mg/dL
Ketones, POC UA: NEGATIVE mg/dL
Nitrite, UA: NEGATIVE
POC PROTEIN,UA: 30 — AB
Spec Grav, UA: 1.015 (ref 1.010–1.025)
Urobilinogen, UA: 0.2 U/dL
pH, UA: 6 (ref 5.0–8.0)

## 2024-02-10 MED ORDER — CEPHALEXIN 250 MG PO CAPS: 250.0000 mg | ORAL_CAPSULE | Freq: Three times a day (TID) | ORAL | 0 refills | Status: AC

## 2024-02-10 NOTE — ED Triage Notes (Signed)
 Pt reports she has to go to the bathroom often at night and when she does she gets bladder and abdominal pain. States she has small clumps in her urine. The pain is only when she urinates at night, not during the day. No meds tried. Sx started Thursday night.

## 2024-02-10 NOTE — ED Provider Notes (Signed)
 [facility]    CSN: [id number] Arrival date & time: 02/10/24  0846      History   Chief Complaint Chief Complaint  Patient presents with   Urinary Frequency    HPI Mackenzie King is a 83 y.o. female.  For the last 2 nights she is having bladder pain/pressure when going to the bathroom. Reports seeing small clumps in her urine, cannot further describe. They are reported to be the color of the toilet water.  She notes frequency overnight.  However these symptoms do not occur during the daytime.  No fever, flank pain, nausea/vomiting  No meds taken yet  History of CKD, renal cell carcinoma -- post nephrectomy   Past Medical History:  Diagnosis Date   Abdominal distention    Asthma    Asymptomatic gallstones    Atypical ductal hyperplasia of right breast    Cancer (HCC) 12/12   renal cell (Dr Adina)   CKD (chronic kidney disease), stage III (HCC)    Dr Perri   Colon polyps    hyperplastic   Dermatitis    intermittent Dr Vernel   Dyslipidemia    Facet arthropathy, cervical    GERD (gastroesophageal reflux disease)    Hearing loss    Hypertension    Nasal congestion    Obstructive sleep apnea    uses CPAP nightly   Osteoarthritis    low back   Osteopenia    mild   Plantar fasciitis    s/p shock tx Dr Magdalen   Pure hypercholesterolemia    Restless legs syndrome (RLS)    Wheezing     Patient Active Problem List   Diagnosis Date Noted   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   Hypertension    Obstructive sleep apnea 05/09/2013   Obesity (BMI 30-39.9) 05/09/2013   Gallstones 02/15/2012    Past Surgical History:  Procedure Laterality Date   BREAST LUMPECTOMY WITH RADIOACTIVE SEED LOCALIZATION Right 03/26/2020   Procedure: RIGHT BREAST LUMPECTOMY WITH RADIOACTIVE SEED LOCALIZATION;  Surgeon: Vanderbilt Ned, MD;  Location: [facility];  Service: General;  Laterality: Right;   BREAST SURGERY  1996   breast  biopsy - benign calcifications   BREAST SURGERY  1977   adenofibroma removed - 3 biopsy's   CHOLECYSTECTOMY  03/19/2012   Procedure: LAPAROSCOPIC CHOLECYSTECTOMY WITH INTRAOPERATIVE CHOLANGIOGRAM;  Surgeon: Elon CHRISTELLA Pacini, MD;  Location: [facility];  Service: General;  Laterality: N/A;   CHOLECYSTECTOMY  03/19/2012   Procedure: CHOLECYSTECTOMY;  Surgeon: Elon CHRISTELLA Pacini, MD;  Location: [facility];  Service: General;  Laterality: N/A;  converted to open @ 1137   EYE SURGERY  02/2012   bilateral cataracts with lens implants   FOOT NEUROMA SURGERY     right foot   LIPOMA EXCISION     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   TUBAL LIGATION  1971    OB History   No obstetric history on file.      Home Medications    Prior to Admission medications   Medication Sig Start Date End Date Taking? Authorizing Provider  Ferrous Sulfate (IRON PO) Take 1 tablet by mouth See admin instructions. 05/10/21  Yes [provider]  Multiple Vitamins-Minerals (CENTRUM SILVER 50+WOMEN PO) Take 1 tablet by mouth daily.   Yes [provider]  pravastatin (PRAVACHOL) 40 MG tablet Take 40 mg by mouth daily.  11/19/13  Yes  [provider]  solifenacin (VESICARE) 5 MG tablet Take 10 mg by mouth daily.    Yes [provider]  valsartan (DIOVAN) 320 MG tablet Take 320 mg by mouth daily.   Yes [provider]  acetaminophen  (TYLENOL ) 650 MG CR tablet Take 650 mg by mouth daily.     [provider]  albuterol  (PROVENTIL  HFA;VENTOLIN  HFA) 108 (90 BASE) MCG/ACT inhaler Inhale 2 puffs into the lungs every 6 (six) hours as needed. For shortness of breath Patient not taking: Reported on 02/10/2024    [provider]  amLODipine (NORVASC) 2.5 MG tablet Take 5 mg by mouth daily.    [provider]  b complex vitamins tablet Take 1 tablet by mouth daily.    [provider]  cephALEXin  (KEFLEX ) 250 MG capsule  Take 1 capsule (250 mg total) by mouth 3 (three) times daily for 7 days. 02/10/24 02/17/24 Yes Vera Wishart, [place], PA-C  cetirizine (ZYRTEC) 10 MG tablet Take 10 mg by mouth at bedtime.     [provider]  Cholecalciferol (VITAMIN D) 2000 UNITS tablet Take 2,000 Units by mouth at bedtime.     [provider]  Coenzyme Q10 (COQ10) 100 MG CAPS Take 1 capsule by mouth daily.    [provider]  Cranberry-Vitamin C-Vitamin E (CRANBERRY PLUS VITAMIN C) 4200-20-3 MG-MG-UNIT CAPS Take 2 capsules by mouth daily.    [provider]  famotidine (PEPCID) 20 MG tablet Take 40 mg by mouth as needed for heartburn or indigestion.    [provider]  fluticasone  (FLONASE ) 50 MCG/ACT nasal spray Place 2 sprays into the nose daily as needed. For allergies    [provider]  furosemide (LASIX) 40 MG tablet Take 40 mg by mouth as needed for fluid or edema.    [provider]  hydrochlorothiazide (HYDRODIURIL) 12.5 MG tablet Take 12.5 mg by mouth every morning. 02/27/20   [provider]  Melatonin 12 MG TABS Take 1 tablet by mouth as needed.    [provider]  SYMBICORT 160-4.5 MCG/ACT inhaler Place 2 puffs into both nostrils 2 (two) times daily. Patient not taking: Reported on 02/10/2024 01/30/19   [provider]    Family History Family History  Problem Relation Age of Onset   Transient ischemic attack Mother    Cancer Mother        breast   Hypertension Mother    Alzheimer's disease Mother    Stroke Father    Colon polyps Father    Cancer Sister    Cancer Paternal Grandfather        lung    Social History Social History   Tobacco Use   Smoking status: Never   Smokeless tobacco: Never  Vaping Use   Vaping status: Never Used  Substance Use Topics   Alcohol use: Not Currently   Drug use: No     Allergies   Crestor [rosuvastatin], Sulfa antibiotics, Ciprofloxacin, Olmesartan, Requip [ropinirole], and  Tamoxifen    Review of Systems Review of Systems As per HPI  Physical Exam Triage Vital Signs ED Triage Vitals  Encounter Vitals Group     BP 02/10/24 0904 111/68     Girls Systolic BP Percentile --      Girls Diastolic BP Percentile --      Boys Systolic BP Percentile --      Boys Diastolic BP Percentile --      Pulse Rate 02/10/24 0904 82     Resp 02/10/24  0904 16     Temp 02/10/24 0904 98.5 F (36.9 C)     Temp Source 02/10/24 0904 Oral     SpO2 02/10/24 0904 96 %     Weight --      Height --      Head Circumference --      Peak Flow --      Pain Score 02/10/24 0858 0     Pain Loc --      Pain Education --      Exclude from Growth Chart --    No data found.  Updated Vital Signs BP 111/68 (BP Location: Left Arm)   Pulse 82   Temp 98.5 F (36.9 C) (Oral)   Resp 16   SpO2 96%    Physical Exam Vitals and nursing note reviewed.  Constitutional:      General: She is not in acute distress. HENT:     Mouth/Throat:     Mouth: Mucous membranes are moist.     Pharynx: Oropharynx is clear.  Eyes:     Conjunctiva/sclera: Conjunctivae normal.     Pupils: Pupils are equal, round, and reactive to light.  Cardiovascular:     Rate and Rhythm: Normal rate and regular rhythm.     Heart sounds: Normal heart sounds.  Pulmonary:     Effort: Pulmonary effort is normal.     Breath sounds: Normal breath sounds.  Abdominal:     General: Bowel sounds are normal.     Palpations: Abdomen is soft.     Tenderness: There is no abdominal tenderness. There is no right CVA tenderness, left CVA tenderness or guarding.  Neurological:     Mental Status: She is alert and oriented to person, place, and time.     UC Treatments / Results  Labs (all labs ordered are listed, but only abnormal results are displayed) Labs Reviewed  POCT URINE DIPSTICK - Abnormal; Notable for the following components:      Result Value   Clarity, UA cloudy (*)    Blood, UA small (*)    POC PROTEIN,UA  =30 (*)    Leukocytes, UA Moderate (2+) (*)    All other components within normal limits  URINE CULTURE    EKG  Radiology No results found.  Procedures Procedures   Medications Ordered in UC Medications - No data to display  Initial Impression / Assessment and Plan / UC Course  I have reviewed the triage vital signs and the nursing notes.  Pertinent labs & imaging results that were available during my care of the patient were reviewed by me and considered in my medical decision making (see chart for details).  Afebrile, stable vitals UA moderate leuks, small RBC. Culture is pending.   Cannot view prior visit labs, out of system. Sees PCP frequently with continual dx of CKD stage 3. Can infer GFR is between 30-59 based on this dx. Will not need specific dose adjustment, just not exceed 1 g/day Keflex  250 mg TID x 7 days.  Increase fluids Contact if culture requires change Patient agrees to plan, no questions  Final Clinical Impressions(s) / UC Diagnoses   Final diagnoses:  Dysuria  Acute cystitis with hematuria     Discharge Instructions      We will call you if anything on urine culture requires a change in therapy (about 1-3 days)  In the meantime I am treating you for a urinary tract infection. Please take the antibiotic KEFLEX . Three times daily,  with meals, for 7 days in a row. This dosing is appropriate for your kidney disease and should not cause any harm.  You should start to feel better in about 2 days on the medicine.  Drink lots of water!  Please watch for worsening symptoms including abdominal pain, upper back pain, fever, weakness, confusion.  If these occur please return immediately or go to the emergency department.     ED Prescriptions     Medication Sig Dispense Auth. Provider   cephALEXin  (KEFLEX ) 250 MG capsule Take 1 capsule (250 mg total) by mouth 3 (three) times daily for 7 days. 21 capsule Jaclynne Baldo, [place], PA-C      PDMP not reviewed  this encounter.   Gaynelle Pastrana, [place], PA-C 02/10/24 1006

## 2024-02-10 NOTE — Discharge Instructions (Addendum)
 We will call you if anything on urine culture requires a change in therapy (about 1-3 days)  In the meantime I am treating you for a urinary tract infection. Please take the antibiotic KEFLEX . Three times daily, with meals, for 7 days in a row. This dosing is appropriate for your kidney disease and should not cause any harm.  You should start to feel better in about 2 days on the medicine.  Drink lots of water!  Please watch for worsening symptoms including abdominal pain, upper back pain, fever, weakness, confusion.  If these occur please return immediately or go to the emergency department.

## 2024-02-12 ENCOUNTER — Ambulatory Visit (HOSPITAL_COMMUNITY): Payer: Self-pay

## 2024-02-12 LAB — URINE CULTURE: Culture: >=100000 — AB

## 2024-02-20 DIAGNOSIS — I129 Hypertensive chronic kidney disease with stage 1 through stage 4 chronic kidney disease, or unspecified chronic kidney disease: Secondary | ICD-10-CM | POA: Diagnosis not present

## 2024-02-20 DIAGNOSIS — Z853 Personal history of malignant neoplasm of breast: Secondary | ICD-10-CM | POA: Diagnosis not present

## 2024-02-20 DIAGNOSIS — N183 Chronic kidney disease, stage 3 unspecified: Secondary | ICD-10-CM | POA: Diagnosis not present

## 2024-02-20 DIAGNOSIS — J4520 Mild intermittent asthma, uncomplicated: Secondary | ICD-10-CM | POA: Diagnosis not present

## 2024-02-20 DIAGNOSIS — E785 Hyperlipidemia, unspecified: Secondary | ICD-10-CM | POA: Diagnosis not present

## 2024-03-10 DIAGNOSIS — I129 Hypertensive chronic kidney disease with stage 1 through stage 4 chronic kidney disease, or unspecified chronic kidney disease: Secondary | ICD-10-CM | POA: Diagnosis not present

## 2024-03-10 DIAGNOSIS — N183 Chronic kidney disease, stage 3 unspecified: Secondary | ICD-10-CM | POA: Diagnosis not present

## 2024-03-22 DIAGNOSIS — E785 Hyperlipidemia, unspecified: Secondary | ICD-10-CM | POA: Diagnosis not present

## 2024-03-22 DIAGNOSIS — J4520 Mild intermittent asthma, uncomplicated: Secondary | ICD-10-CM | POA: Diagnosis not present

## 2024-03-22 DIAGNOSIS — Z853 Personal history of malignant neoplasm of breast: Secondary | ICD-10-CM | POA: Diagnosis not present

## 2024-03-22 DIAGNOSIS — N183 Chronic kidney disease, stage 3 unspecified: Secondary | ICD-10-CM | POA: Diagnosis not present

## 2024-04-09 DIAGNOSIS — I129 Hypertensive chronic kidney disease with stage 1 through stage 4 chronic kidney disease, or unspecified chronic kidney disease: Secondary | ICD-10-CM | POA: Diagnosis not present

## 2024-04-09 DIAGNOSIS — N183 Chronic kidney disease, stage 3 unspecified: Secondary | ICD-10-CM | POA: Diagnosis not present

## 2024-04-21 DIAGNOSIS — E785 Hyperlipidemia, unspecified: Secondary | ICD-10-CM | POA: Diagnosis not present

## 2024-04-21 DIAGNOSIS — Z853 Personal history of malignant neoplasm of breast: Secondary | ICD-10-CM | POA: Diagnosis not present

## 2024-04-21 DIAGNOSIS — N183 Chronic kidney disease, stage 3 unspecified: Secondary | ICD-10-CM | POA: Diagnosis not present

## 2024-04-21 DIAGNOSIS — J4520 Mild intermittent asthma, uncomplicated: Secondary | ICD-10-CM | POA: Diagnosis not present

## 2024-04-21 DIAGNOSIS — I129 Hypertensive chronic kidney disease with stage 1 through stage 4 chronic kidney disease, or unspecified chronic kidney disease: Secondary | ICD-10-CM | POA: Diagnosis not present

## 2024-05-28 ENCOUNTER — Encounter: Payer: Self-pay | Admitting: Physician Assistant

## 2024-06-17 ENCOUNTER — Ambulatory Visit: Payer: Self-pay | Admitting: Physician Assistant

## 2024-06-17 ENCOUNTER — Ambulatory Visit: Payer: Self-pay

## 2024-07-18 ENCOUNTER — Ambulatory Visit: Admitting: Physician Assistant

## 2024-07-18 ENCOUNTER — Ambulatory Visit
